# Patient Record
Sex: Male | Born: 1935 | Race: White | Hispanic: No | Marital: Married | Smoking: Former smoker
Health system: Southern US, Community
[De-identification: ages and names within clinical notes are randomized; demographics above are authoritative.]

## PROBLEM LIST (undated history)

## (undated) DIAGNOSIS — E785 Hyperlipidemia, unspecified: Secondary | ICD-10-CM

## (undated) DIAGNOSIS — K219 Gastro-esophageal reflux disease without esophagitis: Secondary | ICD-10-CM

## (undated) DIAGNOSIS — K5790 Diverticulosis of intestine, part unspecified, without perforation or abscess without bleeding: Secondary | ICD-10-CM

## (undated) DIAGNOSIS — I10 Essential (primary) hypertension: Secondary | ICD-10-CM

## (undated) DIAGNOSIS — H9190 Unspecified hearing loss, unspecified ear: Secondary | ICD-10-CM

## (undated) DIAGNOSIS — C61 Malignant neoplasm of prostate: Secondary | ICD-10-CM

## (undated) DIAGNOSIS — M1990 Unspecified osteoarthritis, unspecified site: Secondary | ICD-10-CM

## (undated) DIAGNOSIS — R918 Other nonspecific abnormal finding of lung field: Secondary | ICD-10-CM

## (undated) DIAGNOSIS — I4891 Unspecified atrial fibrillation: Secondary | ICD-10-CM

## (undated) DIAGNOSIS — I499 Cardiac arrhythmia, unspecified: Secondary | ICD-10-CM

## (undated) HISTORY — PX: EYE SURGERY: SHX253

## (undated) HISTORY — DX: Gastro-esophageal reflux disease without esophagitis: K21.9

## (undated) HISTORY — DX: Unspecified atrial fibrillation: I48.91

## (undated) HISTORY — DX: Diverticulosis of intestine, part unspecified, without perforation or abscess without bleeding: K57.90

## (undated) HISTORY — DX: Hyperlipidemia, unspecified: E78.5

## (undated) HISTORY — DX: Unspecified osteoarthritis, unspecified site: M19.90

## (undated) HISTORY — DX: Essential (primary) hypertension: I10

## (undated) HISTORY — DX: Other nonspecific abnormal finding of lung field: R91.8

## (undated) HISTORY — DX: Malignant neoplasm of prostate: C61

## (undated) HISTORY — PX: JOINT REPLACEMENT: SHX530

## (undated) HISTORY — PX: PROSTATE SURGERY: SHX751

## (undated) HISTORY — PX: VASECTOMY: SHX75

## (undated) HISTORY — PX: TONSILLECTOMY: SUR1361

---

## 1997-10-15 ENCOUNTER — Ambulatory Visit (HOSPITAL_COMMUNITY): Admission: RE | Admit: 1997-10-15 | Discharge: 1997-10-15 | Payer: Self-pay | Admitting: Family Medicine

## 1999-04-05 DIAGNOSIS — C61 Malignant neoplasm of prostate: Secondary | ICD-10-CM

## 1999-04-05 HISTORY — DX: Malignant neoplasm of prostate: C61

## 1999-04-05 HISTORY — PX: ESOPHAGEAL DILATION: SHX303

## 1999-06-22 ENCOUNTER — Other Ambulatory Visit: Admission: RE | Admit: 1999-06-22 | Discharge: 1999-06-22 | Payer: Self-pay | Admitting: Urology

## 1999-07-06 ENCOUNTER — Encounter: Admission: RE | Admit: 1999-07-06 | Discharge: 1999-10-04 | Payer: Self-pay | Admitting: Radiation Oncology

## 1999-08-17 ENCOUNTER — Ambulatory Visit (HOSPITAL_BASED_OUTPATIENT_CLINIC_OR_DEPARTMENT_OTHER): Admission: RE | Admit: 1999-08-17 | Discharge: 1999-08-17 | Payer: Self-pay | Admitting: Urology

## 1999-08-17 ENCOUNTER — Encounter: Payer: Self-pay | Admitting: Urology

## 1999-09-07 ENCOUNTER — Encounter (INDEPENDENT_AMBULATORY_CARE_PROVIDER_SITE_OTHER): Payer: Self-pay | Admitting: *Deleted

## 2000-03-17 ENCOUNTER — Ambulatory Visit (HOSPITAL_COMMUNITY): Admission: RE | Admit: 2000-03-17 | Discharge: 2000-03-17 | Payer: Self-pay | Admitting: Internal Medicine

## 2000-03-17 ENCOUNTER — Encounter: Payer: Self-pay | Admitting: Internal Medicine

## 2002-12-31 ENCOUNTER — Encounter (INDEPENDENT_AMBULATORY_CARE_PROVIDER_SITE_OTHER): Payer: Self-pay | Admitting: *Deleted

## 2002-12-31 ENCOUNTER — Ambulatory Visit (HOSPITAL_COMMUNITY): Admission: RE | Admit: 2002-12-31 | Discharge: 2002-12-31 | Payer: Self-pay | Admitting: Gastroenterology

## 2004-01-09 ENCOUNTER — Encounter: Payer: Self-pay | Admitting: Internal Medicine

## 2004-01-09 ENCOUNTER — Ambulatory Visit (HOSPITAL_COMMUNITY): Admission: RE | Admit: 2004-01-09 | Discharge: 2004-01-09 | Payer: Self-pay | Admitting: Internal Medicine

## 2004-12-16 ENCOUNTER — Ambulatory Visit: Payer: Self-pay | Admitting: Cardiology

## 2005-01-03 ENCOUNTER — Ambulatory Visit: Payer: Self-pay

## 2005-11-25 ENCOUNTER — Encounter: Admission: RE | Admit: 2005-11-25 | Discharge: 2005-11-25 | Payer: Self-pay | Admitting: Family Medicine

## 2006-04-04 HISTORY — PX: CATARACT EXTRACTION: SUR2

## 2008-05-10 ENCOUNTER — Inpatient Hospital Stay (HOSPITAL_COMMUNITY): Admission: EM | Admit: 2008-05-10 | Discharge: 2008-05-12 | Payer: Self-pay | Admitting: Emergency Medicine

## 2008-05-10 ENCOUNTER — Ambulatory Visit: Payer: Self-pay | Admitting: Cardiovascular Disease

## 2008-05-10 DIAGNOSIS — I4891 Unspecified atrial fibrillation: Secondary | ICD-10-CM | POA: Insufficient documentation

## 2008-05-11 ENCOUNTER — Encounter: Payer: Self-pay | Admitting: Cardiology

## 2008-05-14 ENCOUNTER — Ambulatory Visit: Payer: Self-pay

## 2008-05-14 ENCOUNTER — Encounter: Payer: Self-pay | Admitting: Internal Medicine

## 2008-05-29 ENCOUNTER — Encounter (INDEPENDENT_AMBULATORY_CARE_PROVIDER_SITE_OTHER): Payer: Self-pay | Admitting: *Deleted

## 2008-05-29 DIAGNOSIS — I10 Essential (primary) hypertension: Secondary | ICD-10-CM

## 2008-05-29 DIAGNOSIS — E785 Hyperlipidemia, unspecified: Secondary | ICD-10-CM

## 2008-05-29 DIAGNOSIS — K219 Gastro-esophageal reflux disease without esophagitis: Secondary | ICD-10-CM | POA: Insufficient documentation

## 2008-06-03 ENCOUNTER — Ambulatory Visit: Payer: Self-pay | Admitting: Cardiology

## 2008-06-06 ENCOUNTER — Ambulatory Visit: Payer: Self-pay | Admitting: Internal Medicine

## 2008-06-11 ENCOUNTER — Ambulatory Visit: Payer: Self-pay | Admitting: Cardiovascular Disease

## 2008-06-20 ENCOUNTER — Ambulatory Visit: Payer: Self-pay | Admitting: Cardiology

## 2008-06-27 ENCOUNTER — Ambulatory Visit: Payer: Self-pay | Admitting: Cardiology

## 2008-07-08 ENCOUNTER — Ambulatory Visit: Payer: Self-pay | Admitting: Cardiovascular Disease

## 2008-07-29 ENCOUNTER — Ambulatory Visit: Payer: Self-pay | Admitting: Cardiology

## 2008-08-05 ENCOUNTER — Ambulatory Visit: Payer: Self-pay | Admitting: Internal Medicine

## 2008-09-02 ENCOUNTER — Encounter: Payer: Self-pay | Admitting: *Deleted

## 2008-09-02 ENCOUNTER — Ambulatory Visit: Payer: Self-pay | Admitting: Cardiology

## 2008-09-02 LAB — CONVERTED CEMR LAB
POC INR: 2.1
Protime: 17.8

## 2008-09-30 ENCOUNTER — Ambulatory Visit: Payer: Self-pay | Admitting: Internal Medicine

## 2008-09-30 ENCOUNTER — Encounter (INDEPENDENT_AMBULATORY_CARE_PROVIDER_SITE_OTHER): Payer: Self-pay | Admitting: Pharmacist

## 2008-09-30 LAB — CONVERTED CEMR LAB: Prothrombin Time: 19.6 s

## 2008-10-03 ENCOUNTER — Encounter (INDEPENDENT_AMBULATORY_CARE_PROVIDER_SITE_OTHER): Payer: Self-pay | Admitting: *Deleted

## 2008-10-08 ENCOUNTER — Encounter: Payer: Self-pay | Admitting: *Deleted

## 2008-10-28 ENCOUNTER — Ambulatory Visit: Payer: Self-pay | Admitting: Internal Medicine

## 2008-10-28 ENCOUNTER — Encounter (INDEPENDENT_AMBULATORY_CARE_PROVIDER_SITE_OTHER): Payer: Self-pay | Admitting: Cardiology

## 2008-10-28 LAB — CONVERTED CEMR LAB: Prothrombin Time: 19.6 s

## 2008-11-25 ENCOUNTER — Ambulatory Visit: Payer: Self-pay | Admitting: Internal Medicine

## 2008-12-30 ENCOUNTER — Ambulatory Visit: Payer: Self-pay | Admitting: Cardiology

## 2009-01-27 ENCOUNTER — Ambulatory Visit: Payer: Self-pay | Admitting: Cardiovascular Disease

## 2009-01-27 LAB — CONVERTED CEMR LAB: POC INR: 2.7

## 2009-02-24 ENCOUNTER — Ambulatory Visit: Payer: Self-pay | Admitting: Cardiovascular Disease

## 2009-03-24 ENCOUNTER — Ambulatory Visit: Payer: Self-pay | Admitting: Internal Medicine

## 2009-03-24 LAB — CONVERTED CEMR LAB: POC INR: 2.7

## 2009-04-21 ENCOUNTER — Ambulatory Visit: Payer: Self-pay | Admitting: Cardiology

## 2009-04-21 LAB — CONVERTED CEMR LAB: POC INR: 2.4

## 2009-05-19 ENCOUNTER — Ambulatory Visit: Payer: Self-pay | Admitting: Internal Medicine

## 2009-05-19 LAB — CONVERTED CEMR LAB: POC INR: 2.3

## 2009-05-28 ENCOUNTER — Encounter (INDEPENDENT_AMBULATORY_CARE_PROVIDER_SITE_OTHER): Payer: Self-pay | Admitting: *Deleted

## 2009-06-16 ENCOUNTER — Ambulatory Visit: Payer: Self-pay | Admitting: Cardiology

## 2009-07-03 ENCOUNTER — Ambulatory Visit: Payer: Self-pay | Admitting: Cardiology

## 2009-07-14 ENCOUNTER — Ambulatory Visit: Payer: Self-pay | Admitting: Cardiovascular Disease

## 2009-07-14 LAB — CONVERTED CEMR LAB: POC INR: 2.3

## 2009-07-27 ENCOUNTER — Telehealth: Payer: Self-pay | Admitting: Cardiology

## 2009-08-11 ENCOUNTER — Ambulatory Visit: Payer: Self-pay | Admitting: Cardiovascular Disease

## 2009-08-11 LAB — CONVERTED CEMR LAB: POC INR: 3.2

## 2009-09-01 ENCOUNTER — Ambulatory Visit: Payer: Self-pay | Admitting: Internal Medicine

## 2009-09-29 ENCOUNTER — Ambulatory Visit: Payer: Self-pay | Admitting: Internal Medicine

## 2009-10-06 ENCOUNTER — Ambulatory Visit: Payer: Self-pay | Admitting: Internal Medicine

## 2009-10-19 ENCOUNTER — Telehealth: Payer: Self-pay | Admitting: Internal Medicine

## 2009-10-20 ENCOUNTER — Ambulatory Visit: Payer: Self-pay | Admitting: Internal Medicine

## 2009-10-20 ENCOUNTER — Encounter (INDEPENDENT_AMBULATORY_CARE_PROVIDER_SITE_OTHER): Payer: Self-pay | Admitting: *Deleted

## 2009-10-20 LAB — CONVERTED CEMR LAB: POC INR: 2.9

## 2009-10-23 ENCOUNTER — Ambulatory Visit: Payer: Self-pay | Admitting: Internal Medicine

## 2009-10-27 ENCOUNTER — Ambulatory Visit: Payer: Self-pay | Admitting: Cardiology

## 2009-10-27 LAB — CONVERTED CEMR LAB: POC INR: 1.4

## 2009-11-03 ENCOUNTER — Ambulatory Visit: Payer: Self-pay | Admitting: Cardiology

## 2009-12-01 ENCOUNTER — Ambulatory Visit: Payer: Self-pay | Admitting: Cardiovascular Disease

## 2009-12-01 LAB — CONVERTED CEMR LAB: POC INR: 2.8

## 2009-12-29 ENCOUNTER — Ambulatory Visit: Payer: Self-pay | Admitting: Cardiovascular Disease

## 2009-12-29 LAB — CONVERTED CEMR LAB: POC INR: 2.5

## 2010-01-26 ENCOUNTER — Ambulatory Visit: Payer: Self-pay | Admitting: Cardiology

## 2010-01-26 LAB — CONVERTED CEMR LAB: POC INR: 3.4

## 2010-02-16 ENCOUNTER — Ambulatory Visit: Payer: Self-pay | Admitting: Cardiovascular Disease

## 2010-03-09 ENCOUNTER — Ambulatory Visit: Payer: Self-pay | Admitting: Cardiovascular Disease

## 2010-04-04 HISTORY — PX: REPLACEMENT TOTAL KNEE: SUR1224

## 2010-04-06 ENCOUNTER — Ambulatory Visit: Admission: RE | Admit: 2010-04-06 | Discharge: 2010-04-06 | Payer: Self-pay | Source: Home / Self Care

## 2010-04-06 LAB — CONVERTED CEMR LAB: POC INR: 2.5

## 2010-05-04 ENCOUNTER — Ambulatory Visit: Admission: RE | Admit: 2010-05-04 | Discharge: 2010-05-04 | Payer: Self-pay | Source: Home / Self Care

## 2010-05-04 NOTE — Progress Notes (Signed)
Summary: [place] regarding medication issue  Phone Note Call from Patient Call back at [facility] Phone [phone]   Caller: Patient Summary of Call: Pt calling regarding a medication Initial call taken by: Judie Grieve,  July 27, 2009 2:07 PM  Follow-up for Phone Call        Christine please call. Follow-up by: Gaylord Shih, MD, [facility],  July 27, 2009 2:52 PM     Appended Document: [place] regarding medication issue FYI PT WAS STARTED ON [place]  BY PMD  FOR ARTHRITIS  TO THE KNEES. WANTING TO MAKE SURE NO CONTRAINDICATIONS./CY

## 2010-05-04 NOTE — Medication Information (Signed)
Summary: rov/ewj  Anticoagulant Therapy  Managed by: Cloyde Reams, RN, BSN PCP: Margrett Rud  Supervising MD: Myrtis Ser MD, Tinnie Gens Indication 1: Atrial Fibrillation (ICD-427.31) Lab Used: [facility] Site: [facility] INR POC 2.7 INR RANGE 2 - 3  Dietary changes: yes       Details: Out of town last week, diet varied.    Health status changes: no    Bleeding/hemorrhagic complications: no    Recent/future hospitalizations: no    Any changes in medication regimen? no    Recent/future dental: no  Any missed doses?: no       Is patient compliant with meds? yes       Allergies (verified): No Known Drug Allergies  Anticoagulation Management History:      The patient is taking warfarin and comes in today for a routine follow up visit.  Positive risk factors for bleeding include an age of 75 years or older.  The bleeding index is 'intermediate risk'.  Positive CHADS2 values include History of HTN and Age > 72 years old.  The start date was 06/03/2008.  Anticoagulation responsible provider: Myrtis Ser MD, Tinnie Gens.  INR POC: 2.7.  Cuvette Lot#: [id number].  Exp: 08/2010.    Anticoagulation Management Assessment/Plan:      The patient's current anticoagulation dose is Coumadin 5 mg tabs: Use as directed by anticoagulation clinic..  The target INR is 2 - 3.  The next INR is due 07/14/2009.  Anticoagulation instructions were given to patient.  Results were reviewed/authorized by Cloyde Reams, RN, BSN.  He was notified by Cloyde Reams RN.         Prior Anticoagulation Instructions: INR 2.3  Continue on same dosage 1 tablet daily except 1/2 tablet on Sundays and Wednesdays.  Recheck in 4 weeks.    Current Anticoagulation Instructions: INR 2.7  Continue on same dosage 1 tablet daily except 1/2 tablet on Sundays and Wednesdays.  Recheck in 4 weeks.

## 2010-05-04 NOTE — Assessment & Plan Note (Signed)
Summary: discuss EGD on BT--ch.   History of Present Illness Visit Type: Initial Visit Primary GI MD: Lina Sar MD Primary Provider: Shary Decamp, MD Chief Complaint: Tightness in esophagus that has recently subsided History of Present Illness:   This is an 77 year old white male who is on Coumadin for atrial fibrillation. He had a recurrence of solid food dysphagia approximately one month ago but his symptoms have gone away and he is currently not having any problems with swallowing. There is a history of a benign distal esophageal stricture dating back to 1988 when he was dilated with Elease Hashimoto dilators 42 [place] up to 29 [place]. He had a 3 cm hiatal hernia and a subsequent dilatation in 1993 then again in 2001 and 2005. He has been on Boniva for osteopenia. He has never had a colonoscopy.   GI Review of Systems    Reports acid reflux and  bloating.      Denies abdominal pain, belching, chest pain, dysphagia with liquids, dysphagia with solids, heartburn, loss of appetite, nausea, vomiting, vomiting blood, weight loss, and  weight gain.      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     Denies black tarry stools, change in bowel habit, constipation, diarrhea, fecal incontinence, heme positive stool, hemorrhoids, irritable bowel syndrome, jaundice, light color stool, liver problems, rectal bleeding, and  rectal pain. Preventive Screening-Counseling & Management  Alcohol-Tobacco     Smoking Status: quit      Drug Use:  no.      Current Medications (verified): 1)  Ramipril 2.5 Mg Caps (Ramipril) .... Take One Capsule By Mouth Daily 2)  Terazosin Hcl 2 Mg Caps (Terazosin Hcl) .... Take 1 Tablet By Mouth At Bedtime. 3)  Famotidine 20 Mg Tabs (Famotidine) .... 2 Tab Once Daily 4)  Glucosamine 1500 Complex  Caps (Glucosamine-Chondroit-Vit C-Mn) .Marland Kitchen.. 1 Tab Once Daily 5)  Fish Oil 1000 Mg Caps (Omega-3 Fatty Acids) .Marland Kitchen.. 1 Cap Once Daily 6)  Vitamin D 1000 Unit Tabs (Cholecalciferol) .Marland Kitchen.. 1  Tab Every Other Day 7)  Boniva 150 Mg Tabs (Ibandronate Sodium) .... Monthly 8)  Vitamin C 1000 Mg Tabs (Ascorbic Acid) .Marland Kitchen.. 1 Tab Once Daily 9)  Coumadin 5 Mg Tabs (Warfarin Sodium) .... Use As Directed By Anticoagulation Clinic.  Allergies (verified): No Known Drug Allergies  Past History:  Past Medical History: Reviewed history from 05/29/2008 and no changes required. ATRIAL FIBRILLATION (ICD-427.31) HYPERTENSION, BENIGN (ICD-401.1) HYPERLIPIDEMIA (ICD-272.4) Hx of GERD (ICD-530.81) Left Lower Lobe Infiltrate, questionable pneumonia 05/2008 Prostate CA treated with radiation in 2001.  Skin CA S/P multiple outpatient surgical removals.  History of dysphagia S/P esophageal dilatation in 2001. 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  Alcohol Use - yes Daily Caffeine Use Illicit Drug Use - no Smoking Status:  quit Drug Use:  no  Review of Systems       The patient complains of arthritis/joint pain, back pain, and urination - excessive.  The patient denies allergy/sinus, anemia, anxiety-new, blood in urine, breast changes/lumps, change in vision, confusion, cough, coughing up blood, depression-new, fainting, fatigue, fever, headaches-new, hearing problems, heart murmur, heart rhythm changes, itching, menstrual pain, muscle pains/cramps, night sweats, nosebleeds, pregnancy symptoms, shortness of breath, skin rash, sleeping problems, sore throat, swelling of feet/legs, swollen lymph glands, thirst - excessive , urination - excessive , urination changes/pain,  urine leakage, vision changes, and voice change.         .ros  Vital Signs:  Patient profile:   77 year old male Height:      70 inches Weight:      159.50 pounds BMI:     22.97 Pulse rate:   64 /  minute Pulse rhythm:   regular BP sitting:   112 / 60  (left arm) Cuff size:   regular  Vitals Entered By: June McMurray CMA Duncan Dull) (October 06, 2009 10:35 AM)  Physical Exam  General:  thin, tall man, alert and oriented, no cough and no hoarseness. Eyes:  PERRLA, no icterus. Mouth:  No deformity or lesions, dentition normal. Neck:  Supple; no masses or thyromegaly. Lungs:  Clear throughout to auscultation. Heart:  irregular S1-S2, no murmur. Skin:  Intact without significant lesions or rashes. Psych:  Alert and cooperative. Normal mood and affect.   Impression & Recommendations:  Problem # 1:  COUMADIN THERAPY (ICD-V58.61) Patient is on chronic long-term Coumadin for atrial fibrillation. Coumadin would have to be discontinued prior to endoscopy and dilatation.  Problem # 2:  Hx of GERD (ICD-530.81) Patient has a history of a benign distal esophageal stricture dilated on 4 prior occasions. His last one was in 2005. He is currently asymptomatic but had some problems last month. He would like to wait and call us when the dysphagia recurs. He will continue on Pepcid 20 mg twice a day.  Patient Instructions: 1)  Please schedule a follow-up appointment as needed.  2)  Please call our GI Office back should you have recurrent symptoms such as trouble swallowing. 3)  Continue Pepcid 20 mg two times a day. 4)  Recommend screening colonoscopy. This could be done at the time of his next dilatation. 5)  Copy sent to : Dr Shary Decamp 6)  The medication list was reviewed and reconciled.  All changed / newly prescribed medications were explained.  A complete medication list was provided to the patient / caregiver.

## 2010-05-04 NOTE — Assessment & Plan Note (Signed)
Summary: 1 year f/u /cy   Visit Type:  Follow-up Primary Provider:  Margrett Rud   CC:  no complaints.  History of Present Illness: Gregory Werner comes in today for evaluation management of his chronic atrial ventilation. He continues on anticoagulation.  He is totally asymptomatic. He's had no bleeding or melena.  His blood pressures and excellent control.    Current Medications (verified): 1)  Ramipril 2.5 Mg Caps (Ramipril) .... Take One Capsule By Mouth Daily 2)  Terazosin Hcl 2 Mg Caps (Terazosin Hcl) .... Take 1 Tablet By Mouth At Bedtime. 3)  Famotidine 20 Mg Tabs (Famotidine) .... 2 Tab Once Daily 4)  Glucosamine 1500 Complex  Caps (Glucosamine-Chondroit-Vit C-Mn) .Marland Kitchen.. 1 Tab Once Daily 5)  Fish Oil 1000 Mg Caps (Omega-3 Fatty Acids) .Marland Kitchen.. 1 Cap Once Daily 6)  Vitamin D 1000 Unit Tabs (Cholecalciferol) .Marland Kitchen.. 1 Tab Every Other Day 7)  Boniva 150 Mg Tabs (Ibandronate Sodium) .... Monthly 8)  Vitamin C 1000 Mg Tabs (Ascorbic Acid) .Marland Kitchen.. 1 Tab Once Daily 9)  Coumadin 5 Mg Tabs (Warfarin Sodium) .... Use As Directed By Anticoagulation Clinic.  Allergies (verified): No Known Drug Allergies  Past History:  Past Medical History: Last updated: 05/29/2008 ATRIAL FIBRILLATION (ICD-427.31) HYPERTENSION, BENIGN (ICD-401.1) HYPERLIPIDEMIA (ICD-272.4) Hx of GERD (ICD-530.81) Left Lower Lobe Infiltrate, questionable pneumonia 05/2008 Prostate CA treated with radiation in 2001.  Skin CA S/P multiple outpatient surgical removals.  History of dysphagia S/P esophageal dilatation in 2001. Hx of Diverticulosis Hx of Urinary Frequency  Past Surgical History: Last updated: 05/29/2008 Esophageal dilatation 2001  Family History: Last updated: 05/29/2008 Family History of Coronary Artery Disease: Father & Mother had Infarcts.  Social History: Last updated: 05/29/2008 Marital Status: Married Retired: Geneticist, molecular at [employer]  Review of Systems       negative  other than history of present illness  Vital Signs:  Patient profile:   77 year old male Height:      69 inches Weight:      164 pounds BMI:     24.31 Pulse rate:   58 / minute BP sitting:   126 / 72  (left arm) Cuff size:   regular  Vitals Entered By: Hardin Negus, RMA (July 03, 2009 2:48 PM)   Physical Exam  General:  elderly, frail Head:  normocephalic and atraumatic Eyes:  PERRLA/EOM intact; conjunctiva and lids normal. Neck:  Neck supple, no JVD. No masses, thyromegaly or abnormal cervical nodes. Chest Gregory Werner:  no deformities or breast masses noted Lungs:  Clear bilaterally to auscultation and percussion. Heart:  PMI nondisplaced, irregular rate and rhythm, no murmur rub or gallop Abdomen:  Bowel sounds positive; abdomen soft and non-tender without masses, organomegaly, or hernias noted. No hepatosplenomegaly. Msk:  decreased ROM.   Pulses:  pulses normal in all 4 extremities Extremities:  No clubbing or cyanosis. Neurologic:  Alert and oriented x 3. Skin:  Intact without lesions or rashes. Psych:  Normal affect.   EKG  Procedure date:  07/03/2009  Findings:      Chronic atrial fibrillation, no change  Impression & Recommendations:  Problem # 1:  ATRIAL FIBRILLATION (ICD-427.31) Assessment Unchanged His condition is stable. No change in management. I tried to convince him again that he needs to stay on warfarin. Hopefully he'll stay on it. His updated medication list for this problem includes:    Coumadin 5 Mg Tabs (Warfarin sodium) ..... Use as directed by anticoagulation clinic.  Orders: EKG w/ Interpretation (  [id number])  Problem # 2:  HYPERTENSION, BENIGN (ICD-401.1) Assessment: Improved  His updated medication list for this problem includes:    Ramipril 2.5 Mg Caps (Ramipril) .Marland Kitchen... Take one capsule by mouth daily    Terazosin Hcl 2 Mg Caps (Terazosin hcl) .Marland Kitchen... Take 1 tablet by mouth at bedtime.  Problem # 3:  Hx of GERD (ICD-530.81) Assessment:  Unchanged  His updated medication list for this problem includes:    Famotidine 20 Mg Tabs (Famotidine) .Marland Kitchen... 2 tab once daily  Problem # 4:  COUMADIN THERAPY (ICD-V58.61) Assessment: Unchanged  Patient Instructions: 1)  Your physician recommends that you schedule a follow-up appointment in: YEAR WITH DR Mazin Emma 2)  Your physician recommends that you continue on your current medications as directed. Please refer to the Current Medication list given to you today.

## 2010-05-04 NOTE — Medication Information (Signed)
Summary: rov/ewj  Anticoagulant Therapy  Managed by: Cloyde Reams, RN, BSN PCP: Margrett Rud  Supervising MD: Myrtis Ser MD, Tinnie Gens Indication 1: Atrial Fibrillation (ICD-427.31) Lab Used: [facility] Site: [facility] INR POC 2.4 INR RANGE 2 - 3  Dietary changes: no    Health status changes: no    Bleeding/hemorrhagic complications: no    Recent/future hospitalizations: no    Any changes in medication regimen? no    Recent/future dental: no  Any missed doses?: no       Is patient compliant with meds? yes       Allergies (verified): No Known Drug Allergies  Anticoagulation Management History:      The patient is taking warfarin and comes in today for a routine follow up visit.  Positive risk factors for bleeding include an age of 50 years or older.  The bleeding index is 'intermediate risk'.  Positive CHADS2 values include History of HTN and Age > 14 years old.  The start date was 06/03/2008.  Anticoagulation responsible provider: Myrtis Ser MD, Tinnie Gens.  INR POC: 2.4.  Cuvette Lot#: [id number].  Exp: 07/2010.    Anticoagulation Management Assessment/Plan:      The patient's current anticoagulation dose is Coumadin 5 mg tabs: Use as directed by anticoagulation clinic..  The target INR is 2 - 3.  The next INR is due 05/19/2009.  Anticoagulation instructions were given to patient.  Results were reviewed/authorized by Cloyde Reams, RN, BSN.  He was notified by Cloyde Reams RN.         Prior Anticoagulation Instructions: INR 2.7  Continue on same dosage 1 tablet daily except 1/2 tablet on Sundays and Wednesdays.   Recheck in 4 weeks.    Current Anticoagulation Instructions: INR 2.4  Continue on same dosage 1 tablet daily except 1/2 tablet on Sundays and Wednesdays.  Recheck in 4 weeks.

## 2010-05-04 NOTE — Miscellaneous (Signed)
Summary:   DYSPHAGIA/STRICTURE   DR.B.../previsit done/rm  Clinical Lists Changes  Observations: Added new observation of ALLERGY REV: Done (10/20/2009 13:42)

## 2010-05-04 NOTE — Medication Information (Signed)
Summary: rov/ewj  Anticoagulant Therapy  Managed by: Weston Brass, PharmD Referring MD: Daleen Squibb MD, Maisie Fus PCP: Shary Decamp, MD Supervising MD: Shirlee Latch MD, Marah Park Indication 1: Atrial Fibrillation (ICD-427.31) Lab Used: [facility] Site: [facility] INR POC 3.4 INR RANGE 2 - 3  Dietary changes: no    Health status changes: no    Bleeding/hemorrhagic complications: no    Recent/future hospitalizations: no    Any changes in medication regimen? yes       Details: had a couple of cortisone shots last week  Recent/future dental: no  Any missed doses?: no       Is patient compliant with meds? yes       Allergies: No Known Drug Allergies  Anticoagulation Management History:      The patient is taking warfarin and comes in today for a routine follow up visit.  Positive risk factors for bleeding include an age of 75 years or older.  The bleeding index is 'intermediate risk'.  Positive CHADS2 values include History of HTN and Age > 75 years old.  The start date was 06/03/2008.  Anticoagulation responsible provider: Shirlee Latch MD, Onie Hayashi.  INR POC: 3.4.  Cuvette Lot#: [id number].  Exp: 03/2011.    Anticoagulation Management Assessment/Plan:      The patient's current anticoagulation dose is Warfarin sodium 5 mg tabs: Use as directed by Anticoagulation Clinic.  The target INR is 2 - 3.  The next INR is due 02/16/2010.  Anticoagulation instructions were given to patient.  Results were reviewed/authorized by Weston Brass, PharmD.  He was notified by Weston Brass PharmD.         Prior Anticoagulation Instructions: INR 2.5  Continue on same dosage 1 tablet daily except 1/2 tablet on Sundays and Wednesdays.  Recheck in 4 weeks.    Current Anticoagulation Instructions: INR 3.4  Skip tomorrow's dose of Coumadin then resume same dose of 1 tablet every day except 1/2 tablet on Sunday and Wednesday.  Eat an extra serving of green vegetables.  Recheck INR in 3 weeks.

## 2010-05-04 NOTE — Medication Information (Signed)
Summary: rov/tm  Anticoagulant Therapy  Managed by: Bethena Midget, RN, BSN Referring MD: Daleen Squibb MD, Maisie Fus PCP: Margrett Rud  Supervising MD: Ladona Ridgel MD, Sharlot Gowda Indication 1: Atrial Fibrillation (ICD-427.31) Lab Used: [facility] Site: [facility] INR POC 2.6 INR RANGE 2 - 3  Dietary changes: no    Health status changes: no    Bleeding/hemorrhagic complications: no    Recent/future hospitalizations: no    Any changes in medication regimen? no    Recent/future dental: no  Any missed doses?: no       Is patient compliant with meds? yes       Allergies: No Known Drug Allergies  Anticoagulation Management History:      The patient is taking warfarin and comes in today for a routine follow up visit.  Positive risk factors for bleeding include an age of 23 years or older.  The bleeding index is 'intermediate risk'.  Positive CHADS2 values include History of HTN and Age > 77 years old.  The start date was 06/03/2008.  Anticoagulation responsible provider: Ladona Ridgel MD, Sharlot Gowda.  INR POC: 2.6.  Cuvette Lot#: [id number].  Exp: 11/2010.    Anticoagulation Management Assessment/Plan:      The patient's current anticoagulation dose is Coumadin 5 mg tabs: Use as directed by anticoagulation clinic..  The target INR is 2 - 3.  The next INR is due 09/29/2009.  Anticoagulation instructions were given to patient.  Results were reviewed/authorized by Bethena Midget, RN, BSN.  He was notified by Alcus Dad B Pharm.         Prior Anticoagulation Instructions: INR 3.2 Skip Wednesday's dose then resume 5mg s daily except 2.5mg s on Sundays and Wednesdays. Recheck in 3 weeks.   Current Anticoagulation Instructions: INR-2.6 Resume normal dosing scedule. Take 1/2 a tablet on Sunday and Wednesday and take 1 tablet on all other days. Return in  4 weeks

## 2010-05-04 NOTE — Medication Information (Signed)
Summary: rov/ln  Anticoagulant Therapy  Managed by: Bethena Midget, RN, BSN Referring MD: Daleen Squibb MD, Maisie Fus PCP: Shary Decamp, MD Supervising MD: Juanda Chance MD, Jasper Ruminski Indication 1: Atrial Fibrillation (ICD-427.31) Lab Used: [facility] Site: [facility] INR POC 1.4 INR RANGE 2 - 3  Dietary changes: no    Health status changes: no    Bleeding/hemorrhagic complications: no    Recent/future hospitalizations: no    Any changes in medication regimen? no    Recent/future dental: no  Any missed doses?: no       Is patient compliant with meds? yes      Comments: Off for Endo from Tues to Friday, date of procedure.  Restarted back on coumadin on Sat.   Allergies: No Known Drug Allergies  Anticoagulation Management History:      His anticoagulation is being managed by telephone today.  Positive risk factors for bleeding include an age of 75 years or older.  The bleeding index is 'intermediate risk'.  Positive CHADS2 values include History of HTN and Age > 75 years old.  The start date was 06/03/2008.  Anticoagulation responsible provider: Juanda Chance MD, Smitty Cords.  INR POC: 1.4.  Cuvette Lot#: [id number].  Exp: 01/2011.    Anticoagulation Management Assessment/Plan:      The patient's current anticoagulation dose is Coumadin 5 mg tabs: Use as directed by anticoagulation clinic..  The target INR is 2 - 3.  The next INR is due 11/03/2009.  Anticoagulation instructions were given to patient.  Results were reviewed/authorized by Bethena Midget, RN, BSN.  He was notified by Bethena Midget, RN, BSN.         Prior Anticoagulation Instructions: INR 2.9  Hold coumadin today through Thursday.  After procedure on Friday, take 1 tab daily.  Re-check INR on 7/26.  Current Anticoagulation Instructions: INR 1.4 Continue 5mg s everyday except 2.5mg s on Sundays and Wednesdays. Recheck in 7-10days.

## 2010-05-04 NOTE — Medication Information (Signed)
Summary: rov/ewj  Anticoagulant Therapy  Managed by: Cloyde Reams, RN, BSN PCP: Margrett Rud  Supervising MD: Excell Seltzer MD, Casimiro Needle Indication 1: Atrial Fibrillation (ICD-427.31) Lab Used: [facility] Site: [facility] INR POC 2.3 INR RANGE 2 - 3  Dietary changes: no    Health status changes: no    Bleeding/hemorrhagic complications: no    Recent/future hospitalizations: no    Any changes in medication regimen? no    Recent/future dental: no  Any missed doses?: no       Is patient compliant with meds? yes       Allergies (verified): No Known Drug Allergies  Anticoagulation Management History:      The patient is taking warfarin and comes in today for a routine follow up visit.  Positive risk factors for bleeding include an age of 75 years or older.  The bleeding index is 'intermediate risk'.  Positive CHADS2 values include History of HTN and Age > 56 years old.  The start date was 06/03/2008.  Anticoagulation responsible provider: Excell Seltzer MD, Casimiro Needle.  INR POC: 2.3.  Cuvette Lot#: [id number].  Exp: 08/2010.    Anticoagulation Management Assessment/Plan:      The patient's current anticoagulation dose is Coumadin 5 mg tabs: Use as directed by anticoagulation clinic..  The target INR is 2 - 3.  The next INR is due 08/11/2009.  Anticoagulation instructions were given to patient.  Results were reviewed/authorized by Cloyde Reams, RN, BSN.  He was notified by Cloyde Reams RN.         Prior Anticoagulation Instructions: INR 2.7  Continue on same dosage 1 tablet daily except 1/2 tablet on Sundays and Wednesdays.  Recheck in 4 weeks.    Current Anticoagulation Instructions: INR 2.3  Continue on same dosage 1 tablet daily except 1/2 tablet on Sundays and Wednesdays.  Recheck in 4 weeks.

## 2010-05-04 NOTE — Op Note (Signed)
Summary: EGD                    [place]  Patient:    Gregory Werner, Gregory Werner                        MRN: [id number] Adm. Date:  [id number] Attending:  Mervin Hack                         History and Physical  PROCEDURE:  Upper endoscopy and esophageal dilation.  INDICATIONS:  This 77 year old gentleman has a benign distal esophageal stricture.  He was dilated in the past and has now recurrence of solid food dysphagia.  He was seen in the office two months ago at which time, he decided to wait a little longer before his dysphagia needs to be treated, but he had another episode of choking several weeks ago, and he decided to go ahead with the dilatation.  He has been on acid-suppressing agent.  ENDOSCOPE:  Fujinon single channel videoscope.  SEDATION:  Versed 10 mg IV, Demerol 50 mg IV.  FINDINGS:  Fujinon single channel videoscope passed under direct vision into the posterior pharynx and into the esophagus.  The patient was monitored by pulse oximetry.  His oxygen saturations were normal.  He was cooperative. Proximal and mid esophageal mucosa was normal.  There was an eccentric lumen in the distal esophagus with a marked esophageal stricture.  It allowed the endoscope to pass through, but it was rather eccentric which probably created the obstructive symptoms.  The stricture was benign appearing.  There were no friability and no active erosions.  There was also a small hiatal hernia which was easily reducable.  Stomach.  The stomach was insufflated with air and showed normal gastric mucosa, antrum, and pyloris.  Retroflexion of endoscope showed a normal fundus and cardia.  Duodenum.  Duodenal bulb and descending duodenum were normal.  Guide wire was placed into the stomach and endoscope was retracted and Savary dilators passed over the guide wire without fluoroscopic guidance using 14, 15, 16, and 17 mm dilator.  There was a small amount of blood on the  large dilator.  The patient tolerated the procedure well.  IMPRESSION:  Benign distal esophageal stricture, status post dilatation to [address] [place].  PLAN: 1. Continue antireflux measures. 2. Acid suppressing agents. 3. Avoid aspirin and NSAIDS. 4. The patient will return on p.r.n. basis.DD:  03/17/00 TD:  03/17/00 Job: [id number]

## 2010-05-04 NOTE — Op Note (Signed)
Summary: COLON (Dr Randa Evens)    NAME:  Gregory Werner, STAGE                           ACCOUNT NO.:  [id number]   MEDICAL RECORD NO.:  [id number]                   PATIENT TYPE:  AMB   LOCATION:  ENDO                                 FACILITY:  [facility]   PHYSICIAN:  James L. Malon Kindle., M.D.          DATE OF BIRTH:  Apr 13, 1924   DATE OF PROCEDURE:  12/31/2002  DATE OF DISCHARGE:                                 OPERATIVE REPORT   PROCEDURE:  Colonoscopy.   MEDICATIONS:  1. Fentanyl 87.5 mcg.  2. Versed 9 mg IV.   INDICATION:  Rectal bleeding.   DESCRIPTION OF PROCEDURE:  The procedure had been explained to the patient  and consent obtained.  The patient in the left lateral decubitus position,  the Olympus scope was inserted and advanced.  We advanced to the sigmoid.  It had extensive diverticular disease.  Multiple maneuvers were required and  eventually we were able to pass the sigmoid and were able to advance easily  to the cecum.  The ileocecal valve and appendiceal orifice seen.  The scope  was withdrawn, and the cecum, ascending colon, transverse colon, descending  and sigmoid colon seen well, no polyps, again extensive diverticular disease  in the sigmoid.  The scope was withdrawn down to the rectum.  The rectum was  free of polyps but at the very distal part of the rectum over the prostate  was a teleangiectasia and erythema consistent with radiation proctitis.  It  was quite friable.  There small internal hemorrhoids.  The scope was  withdrawn.  The patient tolerated the procedure well, was maintained on low-  flow oxygen and monitored by pulse oximetry throughout with no obvious  problems.  He was bradycardic with pulses in the 40's-50's throughout the  procedure, felt to be due to his beta blockers.   ASSESSMENT:  1. 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  2. Marked diverticular disease.   PLAN:  We will give diverticulitis information and start on fiber  supplements daily to soften the stool and will give Rowasa suppositories  q.h.s.  We will see him back in the office in 6-8 weeks.  If he is not  improved, we may need to fulgurate this area with the argon plasma  coagulator.                                               James L. Malon Kindle., M.D.    Waldron Session  D:  12/31/2002  T:  12/31/2002  Job:  [id number]   cc:   [place]. Andrey Campanile, M.D.  [address]  [place]  [place] [address]  Fax: [phone]  This report was created from the original endoscopy report, which was reviewed and signed by the above listed endoscopist.

## 2010-05-04 NOTE — Medication Information (Signed)
Summary: rov/ewj  Anticoagulant Therapy  Managed by: Cloyde Reams, RN, BSN Referring MD: Daleen Squibb MD, Maisie Fus PCP: Shary Decamp, MD Supervising MD: Eden Emms MD, Theron Arista Indication 1: Atrial Fibrillation (ICD-427.31) Lab Used: [facility] Site: [facility] INR POC 2.5 INR RANGE 2 - 3  Dietary changes: yes       Details: Incr vit K intake recently.   Health status changes: no    Bleeding/hemorrhagic complications: no    Recent/future hospitalizations: no    Any changes in medication regimen? no    Recent/future dental: no  Any missed doses?: no       Is patient compliant with meds? yes       Allergies: No Known Drug Allergies  Anticoagulation Management History:      The patient is taking warfarin and comes in today for a routine follow up visit.  Positive risk factors for bleeding include an age of 51 years or older.  The bleeding index is 'intermediate risk'.  Positive CHADS2 values include History of HTN and Age > 70 years old.  The start date was 06/03/2008.  Anticoagulation responsible provider: Eden Emms MD, Theron Arista.  INR POC: 2.5.  Cuvette Lot#: [id number].  Exp: 02/2011.    Anticoagulation Management Assessment/Plan:      The patient's current anticoagulation dose is Warfarin sodium 5 mg tabs: Use as directed by Anticoagulation Clinic.  The target INR is 2 - 3.  The next INR is due 01/26/2010.  Anticoagulation instructions were given to patient.  Results were reviewed/authorized by Cloyde Reams, RN, BSN.  He was notified by Cloyde Reams RN.         Prior Anticoagulation Instructions: INR 2.8  Continue on same dosage 1 tablet daily except 1/2 tablet on Sundays and Wednesdays.  Recheck in 4 weeks.    Current Anticoagulation Instructions: INR 2.5  Continue on same dosage 1 tablet daily except 1/2 tablet on Sundays and Wednesdays.  Recheck in 4 weeks.

## 2010-05-04 NOTE — Progress Notes (Signed)
Summary: TRIAGE  Phone Note Call from Patient Call back at Home Phone [phone]   Caller: Patient Call For: Dr. Juanda Chance Reason for Call: Talk to Nurse Summary of Call: pt called to sch EGD, but is on Coumadin... complaining of "general tightness in esophagus region" Initial call taken by: Vallarie Mare,  October 19, 2009 2:37 PM  Follow-up for Phone Call        Last seen 10-06-09. Worsening dysphagia, pt. is on Coumadin.  DR.Davarion Cuffee PLEASE ADVISE  Follow-up by: Laureen Ochs LPN,  October 19, 2009 3:03 PM  Additional Follow-up for Phone Call Additional follow up Details #1::        chart reviewed. Es stricture 2005 and 2000, s/ p dilation. Now on Coumadin since 06/2018 for at. fib ( Dr Daleen Squibb). He  will benefit from EGD/dil. Please hold Coumadin and set up for EGD/dil in [facility] on Fri 7/22/20011. I have spoken to the pt over the phone and he agrees. Additional Follow-up by: Hart Carwin MD,  October 19, 2009 6:19 PM    Additional Follow-up for Phone Call Additional follow up Details #2::    Pt. is scheduled for a previsit today at 2pm and his Endo/Dil. in [facility] on 10-23-09 at 3:30pm. Pt. instructed to call back as needed.  Follow-up by: Laureen Ochs LPN,  October 20, 2009 8:14 AM

## 2010-05-04 NOTE — Procedures (Signed)
Summary: EGD   EGD  Procedure date:  01/09/2004  Findings:      Location: [facility]   Patient Name: [name], [name] MRN: [id number] Procedure Procedures: Panendoscopy (EGD) CPT: 43235.    with esophageal dilation. CPT: G9296129.  Personnel: Endoscopist: Genevive Printup L. Juanda Chance, MD.  Referred By: Margrett Rud, MD.  Exam Location: Exam performed in Endoscopy Suite.  Patient Consent: Procedure, Alternatives, Risks and Benefits discussed, consent obtained,  Indications Symptoms: Dysphagia.  Surveillance of: 2001.  History  Current Medications: Patient is not currently taking Coumadin.  Pre-Exam Physical: Performed Jan 09, 2004  Entire physical exam was normal.  Exam Exam Info: Maximum depth of insertion Duodenum, intended Duodenum. Vocal cords visualized. Gastric retroflexion performed. Images taken. ASA Classification: I. Tolerance: good.  Sedation Meds: Fentanyl 50 mcg. given IV. Versed 5 mg. given IV.  Monitoring: BP and pulse monitoring done. Oximetry used. Supplemental O2 given  Fluoroscopy: Fluoroscopy was not used.  Findings - ESOPHAGEAL INFLAMMATION: suspected as a result of reflux. Severity is moderate, erosions present.  Length of inflammation: 2 cm. [place] Classification: Grade B. ICD9: Esophagitis, Reflux: 530.11.  - STRICTURE / STENOSIS: Distal Esophagus.  Constriction: partial. Etiology: benign due to reflux. Lumen diameter is 14 mm. ICD9: Esophageal Stricture: 530.3. Comment: linear erosions in the distal esophagus.  - Dilation: Distal Esophagus. Savary dilator used, Diameter: 12.8,14, 15,16,17, mm, Minimal Resistance, Minimal Heme present on extraction. 5  total dilators used. Patient tolerance fair. Outcome: successful. Comments: recurrent benign appearing stricture.  - HIATAL HERNIA: 3-4 cms. in length. nonreducible. ICD9: Hernia, Hiatal: 553.3.  Assessment Abnormal examination, see findings above.  Diagnoses: 530.3: Esophageal  Stricture.  553.3: Hernia, Hiatal.  530.3: Esophageal Stricture.  530.11: Esophagitis, Reflux.   Comments: s/p dilation to 17mm Events  Unplanned Intervention: No unplanned interventions were required.  Unplanned Events: There were no complications. Plans Medication(s): H2Blocker: Pepcid/Famotidine 40 mg QD, starting Jan 09, 2004   Patient Education: Patient given standard instructions for: Reflux.  Comments: Pepcid ac is not strong enough to suppress his acid reflux as evidenced by persistent erosions Disposition: After procedure patient sent to recovery.   This report was created from the original endoscopy report, which was reviewed and signed by the above listed endoscopist.

## 2010-05-04 NOTE — Medication Information (Signed)
Summary: rov/kb  Anticoagulant Therapy  Managed by: Bethena Midget, RN, BSN Referring MD: Daleen Squibb MD, Maisie Fus PCP: Margrett Rud  Supervising MD: Gala Romney MD, Reuel Boom Indication 1: Atrial Fibrillation (ICD-427.31) Lab Used: [facility] Site: [facility] INR POC 3.3 INR RANGE 2 - 3  Dietary changes: no    Health status changes: no    Bleeding/hemorrhagic complications: no    Recent/future hospitalizations: no    Any changes in medication regimen? no    Recent/future dental: no  Any missed doses?: no       Is patient compliant with meds? yes       Allergies: No Known Drug Allergies  Anticoagulation Management History:      The patient is taking warfarin and comes in today for a routine follow up visit.  Positive risk factors for bleeding include an age of 5 years or older.  The bleeding index is 'intermediate risk'.  Positive CHADS2 values include History of HTN and Age > 42 years old.  The start date was 06/03/2008.  Anticoagulation responsible provider: Gissella Niblack MD, Reuel Boom.  INR POC: 3.3.  Cuvette Lot#: [id number].  Exp: 12/2010.    Anticoagulation Management Assessment/Plan:      The patient's current anticoagulation dose is Coumadin 5 mg tabs: Use as directed by anticoagulation clinic..  The target INR is 2 - 3.  The next INR is due 10/20/2009.  Anticoagulation instructions were given to patient.  Results were reviewed/authorized by Bethena Midget, RN, BSN.  He was notified by Bethena Midget, RN, BSN.         Prior Anticoagulation Instructions: INR-2.6 Resume normal dosing scedule. Take 1/2 a tablet on Sunday and Wednesday and take 1 tablet on all other days. Return in  4 weeks  Current Anticoagulation Instructions: INR 3.3 Skip Wednesdays dose then resume 5mg s daily except 2.5mg s on Sundays and Wednesdays. Recheck in 3 weeks.

## 2010-05-04 NOTE — Medication Information (Signed)
Summary: rov/ewj  Anticoagulant Therapy  Managed by: Bethena Midget, RN, BSN Referring MD: Daleen Squibb MD, Maisie Fus PCP: Margrett Rud  Supervising MD: Eden Emms MD, Theron Arista Indication 1: Atrial Fibrillation (ICD-427.31) Lab Used: [facility] Site: [facility] INR POC 3.2 INR RANGE 2 - 3  Dietary changes: no    Health status changes: no    Bleeding/hemorrhagic complications: no    Recent/future hospitalizations: no    Any changes in medication regimen? yes       Details: Pennsaid topical BID for arthritic knee pain  Recent/future dental: no  Any missed doses?: no       Is patient compliant with meds? yes       Allergies: No Known Drug Allergies  Anticoagulation Management History:      The patient is taking warfarin and comes in today for a routine follow up visit.  Positive risk factors for bleeding include an age of 75 years or older.  The bleeding index is 'intermediate risk'.  Positive CHADS2 values include History of HTN and Age > 28 years old.  The start date was 06/03/2008.  Anticoagulation responsible provider: Eden Emms MD, Theron Arista.  INR POC: 3.2.  Cuvette Lot#: [id number].  Exp: 11/2010.    Anticoagulation Management Assessment/Plan:      The patient's current anticoagulation dose is Coumadin 5 mg tabs: Use as directed by anticoagulation clinic..  The target INR is 2 - 3.  The next INR is due 09/01/2009.  Anticoagulation instructions were given to patient.  Results were reviewed/authorized by Bethena Midget, RN, BSN.  He was notified by Bethena Midget, RN, BSN.         Prior Anticoagulation Instructions: INR 2.3  Continue on same dosage 1 tablet daily except 1/2 tablet on Sundays and Wednesdays.  Recheck in 4 weeks.    Current Anticoagulation Instructions: INR 3.2 Skip Wednesday's dose then resume 5mg s daily except 2.5mg s on Sundays and Wednesdays. Recheck in 3 weeks.

## 2010-05-04 NOTE — Procedures (Signed)
Summary: Upper Endoscopy  Patient: [name] Note: All result statuses are Final unless otherwise noted.  Tests: (1) Upper Endoscopy (EGD)   EGD Upper Endoscopy       DONE     [facility]     [address]     [place], [place]  [address]           ENDOSCOPY PROCEDURE REPORT           PATIENT:  Gregory Werner, Gregory Werner  MR#:  [id number]     BIRTHDATE:  17-Feb-1925, 84 yrs. old  GENDER:  male           ENDOSCOPIST:  Hedwig Morton. Juanda Chance, MD     Referred by:  Thayer Headings, M.D.           PROCEDURE DATE:  10/23/2009     PROCEDURE:  EGD with dilatation over guidewire     ASA CLASS:  Class II     INDICATIONS:  dysphagia pt on Coumadin, d/c'ed 4 days ago     hx of es.stricture 1988, s/p dil in 1993,2001,2005           MEDICATIONS:   Versed 3 mg, Fentanyl 25 mcg     TOPICAL ANESTHETIC:  Exactacain Spray           DESCRIPTION OF PROCEDURE:   After the risks benefits and     alternatives of the procedure were thoroughly explained, informed     consent was obtained.  The [id number] endoscope was     introduced through the mouth and advanced to the second portion of     the duodenum, without limitations.  The instrument was slowly     withdrawn as the mucosa was fully examined.     <<PROCEDUREIMAGES>>           A stricture was found in the distal esophagus (see image1 and     image2). benign appearing 14 mm stricture, no erosions Savary     dilation over a guidewire 15,16,17 mm dilators passed without     difficulty  A hiatal hernia was found (see image5 and image1). 3     cm nonreducible hiatal hernia  Otherwise the examination was     normal (see image3, image4, and image6).    Retroflexed views     revealed no abnormalities.    The scope was then withdrawn from     the patient and the procedure completed.           COMPLICATIONS:  None           ENDOSCOPIC IMPRESSION:     1) Stricture in the distal esophagus     2) Hiatal hernia     3) Otherwise normal examination  s/p Savary dilation to 17 mm     RECOMMENDATIONS:     1) Anti-reflux regimen to be follow     continue Famotidine     Resume Coumadin tomorrow           REPEAT EXAM:  In 0 year(s) for.  redilation prn           ______________________________     Hedwig Morton. Juanda Chance, MD           CC:           n.     eSIGNED:   Hedwig Morton. Lora Chavers at 10/26/2009 02:39 PM           Gregory Werner, Gregory Werner, [id number]  Note: An exclamation mark Marland Kitchen)  indicates a result that was not dispersed into the flowsheet. Document Creation Date: 10/26/2009 2:41 PM _______________________________________________________________________  (1) Order result status: Final Collection or observation date-time: 10/23/2009 16:37 Requested date-time:  Receipt date-time:  Reported date-time:  Referring Physician:   Ordering Physician: Lina Sar [phone]) Specimen Source:  Source: Launa Grill Order Number: [id number] Lab site:

## 2010-05-04 NOTE — Medication Information (Signed)
Summary: rov/sp  Anticoagulant Therapy  Managed by: Bethena Midget, RN, BSN Referring MD: Daleen Squibb MD, Maisie Fus PCP: Shary Decamp, MD Supervising MD: Eden Emms MD, Theron Arista Indication 1: Atrial Fibrillation (ICD-427.31) Lab Used: [facility] Site: [facility] INR POC 2.4 INR RANGE 2 - 3  Dietary changes: no    Health status changes: no    Bleeding/hemorrhagic complications: no    Recent/future hospitalizations: no    Any changes in medication regimen? no    Recent/future dental: no  Any missed doses?: no       Is patient compliant with meds? yes       Allergies: No Known Drug Allergies  Anticoagulation Management History:      The patient is taking warfarin and comes in today for a routine follow up visit.  Positive risk factors for bleeding include an age of 71 years or older.  The bleeding index is 'intermediate risk'.  Positive CHADS2 values include History of HTN and Age > 75 years old.  The start date was 06/03/2008.  Anticoagulation responsible provider: Eden Emms MD, Theron Arista.  INR POC: 2.4.  Cuvette Lot#: [id number].  Exp: 01/2011.    Anticoagulation Management Assessment/Plan:      The patient's current anticoagulation dose is Warfarin sodium 5 mg tabs: Use as directed by Anticoagulation Clinic.  The target INR is 2 - 3.  The next INR is due 04/06/2010.  Anticoagulation instructions were given to patient.  Results were reviewed/authorized by Bethena Midget, RN, BSN.  He was notified by Bethena Midget, RN, BSN.         Prior Anticoagulation Instructions: INR 3.1  Decrease dose to 1 tablet every day except 1/2 tablet on Sunday, Wednesday and Friday.  Recheck INR in 3 weeks.   Current Anticoagulation Instructions: INR 2.4 Continue 1 pill everyday except 1/2 pill on Sundays, Wednesdays and Fridays. Recheck in 4 weeks.

## 2010-05-04 NOTE — Medication Information (Signed)
Summary: rov/sp  Anticoagulant Therapy  Managed by: Weston Brass, PharmD Referring MD: Daleen Squibb MD, Maisie Fus PCP: Shary Decamp, MD Supervising MD: Excell Seltzer MD, Casimiro Needle Indication 1: Atrial Fibrillation (ICD-427.31) Lab Used: [facility] Site: [facility] INR POC 3.1 INR RANGE 2 - 3  Dietary changes: yes       Details: extra cabbage lately  Health status changes: no    Bleeding/hemorrhagic complications: no    Recent/future hospitalizations: no    Any changes in medication regimen? no    Recent/future dental: no  Any missed doses?: no       Is patient compliant with meds? yes       Allergies: No Known Drug Allergies  Anticoagulation Management History:      The patient is taking warfarin and comes in today for a routine follow up visit.  Positive risk factors for bleeding include an age of 75 years or older.  The bleeding index is 'intermediate risk'.  Positive CHADS2 values include History of HTN and Age > 75 years old.  The start date was 06/03/2008.  Anticoagulation responsible provider: Excell Seltzer MD, Casimiro Needle.  INR POC: 3.1.  Cuvette Lot#: [id number].  Exp: 03/2011.    Anticoagulation Management Assessment/Plan:      The patient's current anticoagulation dose is Warfarin sodium 5 mg tabs: Use as directed by Anticoagulation Clinic.  The target INR is 2 - 3.  The next INR is due 03/09/2010.  Anticoagulation instructions were given to patient.  Results were reviewed/authorized by Weston Brass, PharmD.  He was notified by Weston Brass PharmD.         Prior Anticoagulation Instructions: INR 3.4  Skip tomorrow's dose of Coumadin then resume same dose of 1 tablet every day except 1/2 tablet on Sunday and Wednesday.  Eat an extra serving of green vegetables.  Recheck INR in 3 weeks.   Current Anticoagulation Instructions: INR 3.1  Decrease dose to 1 tablet every day except 1/2 tablet on Sunday, Wednesday and Friday.  Recheck INR in 3 weeks.

## 2010-05-04 NOTE — Medication Information (Signed)
Summary: rov/tm  Anticoagulant Therapy  Managed by: Weston Brass, PharmD Referring MD: Daleen Squibb MD, Maisie Fus PCP: Shary Decamp, MD Supervising MD: Graciela Husbands MD, Viviann Spare Indication 1: Atrial Fibrillation (ICD-427.31) Lab Used: [facility] Site: [facility] INR POC 2.9 INR RANGE 2 - 3  Dietary changes: no    Health status changes: no    Bleeding/hemorrhagic complications: no    Recent/future hospitalizations: yes       Details: scheduled for colonoscopy this Friday 7/22  Any changes in medication regimen? no    Recent/future dental: no  Any missed doses?: yes     Details: last dose of coumadin was yesterday 7/18 in preparation for colonoscopy  Is patient compliant with meds? yes       Allergies: No Known Drug Allergies  Anticoagulation Management History:      The patient is taking warfarin and comes in today for a routine follow up visit.  Positive risk factors for bleeding include an age of 46 years or older.  The bleeding index is 'intermediate risk'.  Positive CHADS2 values include History of HTN and Age > 83 years old.  The start date was 06/03/2008.  Anticoagulation responsible provider: Graciela Husbands MD, Viviann Spare.  INR POC: 2.9.  Cuvette Lot#: [id number].  Exp: 01/2011.    Anticoagulation Management Assessment/Plan:      The patient's current anticoagulation dose is Coumadin 5 mg tabs: Use as directed by anticoagulation clinic..  The target INR is 2 - 3.  The next INR is due 10/27/2009.  Anticoagulation instructions were given to patient.  Results were reviewed/authorized by Weston Brass, PharmD.  He was notified by Dillard Cannon.         Prior Anticoagulation Instructions: INR 3.3 Skip Wednesdays dose then resume 5mg s daily except 2.5mg s on Sundays and Wednesdays. Recheck in 3 weeks.   Current Anticoagulation Instructions: INR 2.9  Hold coumadin today through Thursday.  After procedure on Friday, take 1 tab daily.  Re-check INR on 7/26.

## 2010-05-04 NOTE — Letter (Signed)
Summary: EGD Instructions  [facility]  [address] [place], [place] [address]   Phone: [phone]  Fax: [phone]       Gregory Werner    12-30-1924    MRN: [id number]       Procedure Day /Date: Friday 10-23-09     Arrival Time:  2:30 p.m.     Procedure Time: 3:30 p.m.     Location of Procedure:                    _x _ [facility] (4th Floor)   PREPARATION FOR ENDOSCOPY   On  FRIDAY 10-23-09, THE DAY OF THE PROCEDURE:  1.   No solid foods, milk or milk products are allowed after midnight the night before your procedure.  2.   Do not drink anything colored red or purple.  Avoid juices with pulp.  No orange juice.  3.  You may drink clear liquids until 1:30 p.m., which is 2 hours before your procedure.                                                                                                CLEAR LIQUIDS INCLUDE: Water Jello Ice Popsicles Tea (sugar ok, no milk/cream) Powdered fruit flavored drinks Coffee (sugar ok, no milk/cream) Gatorade Juice: apple, white grape, white cranberry  Lemonade Clear bullion, consomm, broth Carbonated beverages (any kind) Strained chicken noodle soup Hard Candy   MEDICATION INSTRUCTIONS  Unless otherwise instructed, you should take regular prescription medications with a small sip of water as early as possible the morning of your procedure.  Diabetic patients - see separate instructions.   Stop taking Coumadin per Dr.Brodies intructions,hold until we tell you  (5 days before procedure).  Additional medication instructions: _             OTHER INSTRUCTIONS  You will need a responsible adult at least 75 years of age to accompany you and drive you home.   This person must remain in the waiting room during your procedure.  Wear loose fitting clothing that is easily removed.  Leave jewelry and other valuables at home.  However, you may wish to bring a book to read or an iPod/MP3 player to  listen to music as you wait for your procedure to start.  Remove all body piercing jewelry and leave at home.  Total time from sign-in until discharge is approximately 2-3 hours.  You should go home directly after your procedure and rest.  You can resume normal activities the day after your procedure.  The day of your procedure you should not:   Drive   Make legal decisions   Operate machinery   Drink alcohol   Return to work  You will receive specific instructions about eating, activities and medications before you leave.    The above instructions have been reviewed and explained to me by   Sherren Kerns RN  October 20, 2009 2:38 PM    I fully understand and can verbalize these instructions _____________________________ Date _________

## 2010-05-04 NOTE — Medication Information (Signed)
Summary: rov/ewj  Anticoagulant Therapy  Managed by: Cloyde Reams, RN, BSN PCP: Margrett Rud  Supervising MD: Graciela Husbands MD, Viviann Spare Indication 1: Atrial Fibrillation (ICD-427.31) Lab Used: [facility] Site: [facility] INR POC 2.3 INR RANGE 2 - 3  Dietary changes: no    Health status changes: no    Bleeding/hemorrhagic complications: no    Recent/future hospitalizations: no    Any changes in medication regimen? no    Recent/future dental: no  Any missed doses?: no       Is patient compliant with meds? yes       Allergies (verified): No Known Drug Allergies  Anticoagulation Management History:      The patient is taking warfarin and comes in today for a routine follow up visit.  Positive risk factors for bleeding include an age of 75 years or older.  The bleeding index is 'intermediate risk'.  Positive CHADS2 values include History of HTN and Age > 6 years old.  The start date was 06/03/2008.  Anticoagulation responsible provider: Graciela Husbands MD, Viviann Spare.  INR POC: 2.3.  Cuvette Lot#: [id number].  Exp: 07/2010.    Anticoagulation Management Assessment/Plan:      The patient's current anticoagulation dose is Coumadin 5 mg tabs: Use as directed by anticoagulation clinic..  The target INR is 2 - 3.  The next INR is due 06/16/2009.  Anticoagulation instructions were given to patient.  Results were reviewed/authorized by Cloyde Reams, RN, BSN.  He was notified by Cloyde Reams RN.         Prior Anticoagulation Instructions: INR 2.4  Continue on same dosage 1 tablet daily except 1/2 tablet on Sundays and Wednesdays.  Recheck in 4 weeks.    Current Anticoagulation Instructions: INR 2.3  Continue on same dosage 1 tablet daily except 1/2 tablet on Sundays and Wednesdays.  Recheck in 4 weeks.

## 2010-05-04 NOTE — Medication Information (Signed)
Summary: rov/tm  Anticoagulant Therapy  Managed by: Weston Brass, PharmD Referring MD: Daleen Squibb MD, Maisie Fus PCP: Shary Decamp, MD Supervising MD: Juanda Chance MD, Bruce Indication 1: Atrial Fibrillation (ICD-427.31) Lab Used: [facility] Site: [facility] INR POC 2.1 INR RANGE 2 - 3  Dietary changes: no    Health status changes: no    Bleeding/hemorrhagic complications: no    Recent/future hospitalizations: no    Any changes in medication regimen? no    Recent/future dental: no  Any missed doses?: no       Is patient compliant with meds? yes       Allergies: No Known Drug Allergies  Anticoagulation Management History:      The patient is taking warfarin and comes in today for a routine follow up visit.  Positive risk factors for bleeding include an age of 75 years or older.  The bleeding index is 'intermediate risk'.  Positive CHADS2 values include History of HTN and Age > 14 years old.  The start date was 06/03/2008.  Anticoagulation responsible Maddax Palinkas: Juanda Chance MD, Smitty Cords.  INR POC: 2.1.  Cuvette Lot#: [id number].  Exp: 01/2011.    Anticoagulation Management Assessment/Plan:      The patient's current anticoagulation dose is Coumadin 5 mg tabs: Use as directed by anticoagulation clinic..  The target INR is 2 - 3.  The next INR is due 12/01/2009.  Anticoagulation instructions were given to patient.  Results were reviewed/authorized by Weston Brass, PharmD.  He was notified by Weston Brass PharmD.         Prior Anticoagulation Instructions: INR 1.4 Continue 5mg s everyday except 2.5mg s on Sundays and Wednesdays. Recheck in 7-10days.   Current Anticoagulation Instructions: INR 2.1  Continue same dose of 1 tablet every day except 1/2 tablet on Sunday and Wednesday.

## 2010-05-04 NOTE — Medication Information (Signed)
Summary: rov/sp  Anticoagulant Therapy  Managed by: Cloyde Reams, RN, BSN Referring MD: Daleen Squibb MD, Maisie Fus PCP: Shary Decamp, MD Supervising MD: Eden Emms MD, Theron Arista Indication 1: Atrial Fibrillation (ICD-427.31) Lab Used: [facility] Site: [facility] INR POC 2.8 INR RANGE 2 - 3  Dietary changes: yes       Details: Ate spiniach and broccoli yesterday  Health status changes: no    Bleeding/hemorrhagic complications: no    Recent/future hospitalizations: no    Any changes in medication regimen? no    Recent/future dental: no  Any missed doses?: no       Is patient compliant with meds? yes       Allergies: No Known Drug Allergies  Anticoagulation Management History:      The patient is taking warfarin and comes in today for a routine follow up visit.  Positive risk factors for bleeding include an age of 75 years or older.  The bleeding index is 'intermediate risk'.  Positive CHADS2 values include History of HTN and Age > 52 years old.  The start date was 06/03/2008.  Anticoagulation responsible provider: Eden Emms MD, Theron Arista.  INR POC: 2.8.  Cuvette Lot#: [id number].  Exp: 01/2011.    Anticoagulation Management Assessment/Plan:      The patient's current anticoagulation dose is Warfarin sodium 5 mg tabs: Use as directed by Anticoagulation Clinic.  The target INR is 2 - 3.  The next INR is due 12/29/2009.  Anticoagulation instructions were given to patient.  Results were reviewed/authorized by Cloyde Reams, RN, BSN.  He was notified by Cloyde Reams RN.         Prior Anticoagulation Instructions: INR 2.1  Continue same dose of 1 tablet every day except 1/2 tablet on Sunday and Wednesday.   Current Anticoagulation Instructions: INR 2.8  Continue on same dosage 1 tablet daily except 1/2 tablet on Sundays and Wednesdays.  Recheck in 4 weeks.

## 2010-05-05 DIAGNOSIS — R918 Other nonspecific abnormal finding of lung field: Secondary | ICD-10-CM

## 2010-05-05 HISTORY — DX: Other nonspecific abnormal finding of lung field: R91.8

## 2010-05-06 NOTE — Medication Information (Signed)
Summary: rov/tm  Anticoagulant Therapy  Managed by: Weston Brass, PharmD Referring MD: Daleen Squibb MD, Maisie Fus PCP: Shary Decamp, MD Supervising MD: Jens Som MD, Arlys John Indication 1: Atrial Fibrillation (ICD-427.31) Lab Used: [facility] Site: [facility] INR POC 2.5 INR RANGE 2 - 3  Dietary changes: no    Health status changes: no    Bleeding/hemorrhagic complications: no    Recent/future hospitalizations: no    Any changes in medication regimen? no    Recent/future dental: no  Any missed doses?: no       Is patient compliant with meds? yes       Allergies: No Known Drug Allergies  Anticoagulation Management History:      The patient is taking warfarin and comes in today for a routine follow up visit.  Positive risk factors for bleeding include an age of 12 years or older.  The bleeding index is 'intermediate risk'.  Positive CHADS2 values include History of HTN and Age > 75 years old.  The start date was 06/03/2008.  Anticoagulation responsible provider: Jens Som MD, Arlys John.  INR POC: 2.5.  Cuvette Lot#: [id number].  Exp: 05/2011.    Anticoagulation Management Assessment/Plan:      The patient's current anticoagulation dose is Warfarin sodium 5 mg tabs: Use as directed by Anticoagulation Clinic.  The target INR is 2 - 3.  The next INR is due 05/04/2010.  Anticoagulation instructions were given to patient.  Results were reviewed/authorized by Weston Brass, PharmD.  He was notified by Weston Brass PharmD.         Prior Anticoagulation Instructions: INR 2.4 Continue 1 pill everyday except 1/2 pill on Sundays, Wednesdays and Fridays. Recheck in 4 weeks.   Current Anticoagulation Instructions: INR 2.5  Continue same dose of 1 tablet every day except 1/2 tablet on Sunday, Wednesday and Friday.  Recheck INR in 4 weeks.

## 2010-05-12 NOTE — Medication Information (Signed)
Summary: rov/sp  Anticoagulant Therapy  Managed by: Weston Brass, PharmD Referring MD: Daleen Squibb MD, Maisie Fus PCP: Shary Decamp, MD Supervising MD: Myrtis Ser MD, Tinnie Gens Indication 1: Atrial Fibrillation (ICD-427.31) Lab Used: [facility] Site: [facility] INR POC 2.1 INR RANGE 2 - 3  Dietary changes: no    Health status changes: no    Bleeding/hemorrhagic complications: no    Recent/future hospitalizations: no    Any changes in medication regimen? no    Recent/future dental: no  Any missed doses?: no       Is patient compliant with meds? yes       Allergies: No Known Drug Allergies  Anticoagulation Management History:      The patient is taking warfarin and comes in today for a routine follow up visit.  Positive risk factors for bleeding include an age of 75 years or older.  The bleeding index is 'intermediate risk'.  Positive CHADS2 values include History of HTN and Age > 57 years old.  The start date was 06/03/2008.  Anticoagulation responsible provider: Myrtis Ser MD, Tinnie Gens.  INR POC: 2.1.  Cuvette Lot#: [id number].  Exp: 05/2011.    Anticoagulation Management Assessment/Plan:      The patient's current anticoagulation dose is Warfarin sodium 5 mg tabs: Use as directed by Anticoagulation Clinic.  The target INR is 2 - 3.  The next INR is due 06/01/2010.  Anticoagulation instructions were given to patient.  Results were reviewed/authorized by Weston Brass, PharmD.  He was notified by Linward Headland, PharmD candidate.         Prior Anticoagulation Instructions: INR 2.5  Continue same dose of 1 tablet every day except 1/2 tablet on Sunday, Wednesday and Friday.  Recheck INR in 4 weeks.   Current Anticoagulation Instructions: INR 2.1 (INR goal: 2-3)  Take 1 tablet everyday except 1/2 tablet on Sundays, Wednesdays, and Fridays.  Recheck in 4 weeks.

## 2010-05-18 DIAGNOSIS — I4891 Unspecified atrial fibrillation: Secondary | ICD-10-CM

## 2010-06-01 ENCOUNTER — Encounter (INDEPENDENT_AMBULATORY_CARE_PROVIDER_SITE_OTHER): Payer: Medicare Other

## 2010-06-01 ENCOUNTER — Encounter: Payer: Self-pay | Admitting: Cardiology

## 2010-06-01 DIAGNOSIS — I4891 Unspecified atrial fibrillation: Secondary | ICD-10-CM

## 2010-06-01 DIAGNOSIS — Z7901 Long term (current) use of anticoagulants: Secondary | ICD-10-CM

## 2010-06-01 LAB — CONVERTED CEMR LAB: POC INR: 2.4

## 2010-06-10 NOTE — Medication Information (Signed)
Summary: rov  Anticoagulant Therapy  Managed by: Weston Brass, PharmD Referring MD: Daleen Squibb MD, Maisie Fus PCP: Shary Decamp, MD Supervising MD: Shirlee Latch MD, Lyndall Bellot Indication 1: Atrial Fibrillation (ICD-427.31) Lab Used: [facility] Site: [facility] INR POC 2.4 INR RANGE 2 - 3  Dietary changes: no    Health status changes: no    Bleeding/hemorrhagic complications: no    Recent/future hospitalizations: no    Any changes in medication regimen? no    Recent/future dental: no  Any missed doses?: no       Is patient compliant with meds? yes       Allergies: No Known Drug Allergies  Anticoagulation Management History:      The patient is taking warfarin and comes in today for a routine follow up visit.  Positive risk factors for bleeding include an age of 75 years or older.  The bleeding index is 'intermediate risk'.  Positive CHADS2 values include History of HTN and Age > 77 years old.  The start date was 06/03/2008.  Anticoagulation responsible provider: Shirlee Latch MD, Ivalee Strauser.  INR POC: 2.4.  Cuvette Lot#: [id number].  Exp: 04/2011.    Anticoagulation Management Assessment/Plan:      The patient's current anticoagulation dose is Warfarin sodium 5 mg tabs: Use as directed by Anticoagulation Clinic.  The target INR is 2 - 3.  The next INR is due 06/29/2010.  Anticoagulation instructions were given to patient.  Results were reviewed/authorized by Weston Brass, PharmD.  He was notified by Margot Chimes PharmD Candidate.         Prior Anticoagulation Instructions: INR 2.1 (INR goal: 2-3)  Take 1 tablet everyday except 1/2 tablet on Sundays, Wednesdays, and Fridays.  Recheck in 4 weeks.  Current Anticoagulation Instructions: INR 2.4  Continue your current dose of 1 tablet everyday except on Sundays, Wednesdays, and Fridays when you take 1/2 tablet.  Recheck INR in 4 weeks.

## 2010-06-29 ENCOUNTER — Ambulatory Visit (INDEPENDENT_AMBULATORY_CARE_PROVIDER_SITE_OTHER): Payer: Medicare Other | Admitting: *Deleted

## 2010-06-29 DIAGNOSIS — Z7901 Long term (current) use of anticoagulants: Secondary | ICD-10-CM | POA: Insufficient documentation

## 2010-06-29 DIAGNOSIS — I4891 Unspecified atrial fibrillation: Secondary | ICD-10-CM

## 2010-06-29 LAB — POCT INR: INR: 2.3

## 2010-06-29 NOTE — Patient Instructions (Signed)
Continue on same dosage 1 tablet daily except 1/2 tablet on Sundays, Wednesdays, and Fridays.  Recheck in 4 weeks.

## 2010-07-20 LAB — DIFFERENTIAL
Basophils Relative: 0 % (ref 0–1)
Eosinophils Absolute: 0.1 10*3/uL (ref 0.0–0.7)
Lymphs Abs: 1.2 10*3/uL (ref 0.7–4.0)
Neutrophils Relative %: 79 % — ABNORMAL HIGH (ref 43–77)

## 2010-07-20 LAB — LIPID PANEL
HDL: 64 mg/dL (ref >39)
Total CHOL/HDL Ratio: 2.7 RATIO
Triglycerides: 34 mg/dL (ref <150)
VLDL: 7 mg/dL (ref 0–40)

## 2010-07-20 LAB — CK TOTAL AND CKMB (NOT AT ARMC)
CK, MB: 2.8 ng/mL (ref 0.3–4.0)
Relative Index: 2.5 (ref 0.0–2.5)

## 2010-07-20 LAB — COMPREHENSIVE METABOLIC PANEL
ALT: 19 U/L (ref 0–53)
Albumin: 3.2 g/dL — ABNORMAL LOW (ref 3.5–5.2)
Alkaline Phosphatase: 49 U/L (ref 39–117)
Potassium: 4.6 mEq/L (ref 3.5–5.1)
Sodium: 136 mEq/L (ref 135–145)
Total Protein: 6 g/dL (ref 6.0–8.3)

## 2010-07-20 LAB — CARDIAC PANEL(CRET KIN+CKTOT+MB+TROPI)
Relative Index: INVALID (ref 0.0–2.5)
Total CK: 90 U/L (ref 7–232)
Troponin I: <0.01 ng/mL (ref 0.00–0.06)

## 2010-07-20 LAB — BASIC METABOLIC PANEL
Calcium: 8.8 mg/dL (ref 8.4–10.5)
Creatinine, Ser: 1.08 mg/dL (ref 0.4–1.5)
GFR calc Af Amer: >60 mL/min (ref >60)

## 2010-07-20 LAB — CBC
Hemoglobin: 12.8 g/dL — ABNORMAL LOW (ref 13.0–17.0)
MCHC: 34.5 g/dL (ref 30.0–36.0)
MCV: 95 fL (ref 78.0–100.0)
Platelets: 198 10*3/uL (ref 150–400)
RBC: 3.94 MIL/uL — ABNORMAL LOW (ref 4.22–5.81)

## 2010-07-20 LAB — TROPONIN I: Troponin I: <0.01 ng/mL (ref 0.00–0.06)

## 2010-07-20 LAB — TSH: TSH: 2.398 u[IU]/mL (ref 0.350–4.500)

## 2010-07-23 ENCOUNTER — Encounter: Payer: Self-pay | Admitting: Cardiology

## 2010-07-26 ENCOUNTER — Ambulatory Visit (INDEPENDENT_AMBULATORY_CARE_PROVIDER_SITE_OTHER): Payer: Medicare Other | Admitting: Cardiology

## 2010-07-26 ENCOUNTER — Encounter: Payer: Self-pay | Admitting: Cardiology

## 2010-07-26 ENCOUNTER — Ambulatory Visit (INDEPENDENT_AMBULATORY_CARE_PROVIDER_SITE_OTHER): Payer: Medicare Other | Admitting: *Deleted

## 2010-07-26 VITALS — BP 132/72 | HR 50 | Resp 14 | Ht 70.0 in | Wt 157.0 lb

## 2010-07-26 DIAGNOSIS — I4891 Unspecified atrial fibrillation: Secondary | ICD-10-CM

## 2010-07-26 DIAGNOSIS — Z7901 Long term (current) use of anticoagulants: Secondary | ICD-10-CM

## 2010-07-26 LAB — POCT INR: INR: 2.3

## 2010-07-26 NOTE — Assessment & Plan Note (Signed)
Stable, no change in treatment.

## 2010-07-26 NOTE — Patient Instructions (Signed)
Your physician recommends that you schedule a follow-up appointment in: 12 months with Dr Wall  

## 2010-07-26 NOTE — Assessment & Plan Note (Signed)
Stable, INR 2.3 today. Will need to stop for 3 days prior to knee surgery.

## 2010-07-26 NOTE — Progress Notes (Signed)
   Patient ID: Gregory Werner, male    DOB: [birth year], 77 y.o.   MRN: [id number]  HPI  Gregory Werner returns for E and M of his CAF and anticoagulation. He is totally asymptomatic. No bleeding or melena reported. He may need elective left knee surgery soon. We will coordinate his Coumadin. He will not need overlap.  EKG shows CAF with IRBBB, no change.   Review of Systems  All other systems reviewed and are negative.      Physical Exam  Nursing note and vitals reviewed. Constitutional: He is oriented to person, place, and time. He appears well-developed and well-nourished. No distress.  HENT:  Head: Normocephalic and atraumatic.  Eyes: EOM are normal. Pupils are equal, round, and reactive to light.  Neck: Normal range of motion. No JVD present. No tracheal deviation present. No thyromegaly present.       No bruits  Cardiovascular: An irregularly irregular rhythm present.  No extrasystoles are present. Bradycardia present.  PMI is not displaced.   Murmur heard.  Systolic murmur is present with a grade of 2/6  Pulses:      Carotid pulses are 2+ on the right side, and 2+ on the left side.      Radial pulses are 2+ on the right side, and 2+ on the left side.       Femoral pulses are 2+ on the right side, and 2+ on the left side.      Popliteal pulses are 2+ on the right side, and 2+ on the left side.       Dorsalis pedis pulses are 2+ on the right side, and 2+ on the left side.       Posterior tibial pulses are 2+ on the right side, and 2+ on the left side.  Pulmonary/Chest: Effort normal and breath sounds normal.  Abdominal: Soft. Bowel sounds are normal.  Musculoskeletal: He exhibits no edema.       Decreased ROM  Neurological: He is alert and oriented to person, place, and time.  Skin: Skin is warm and dry.  Psychiatric: He has a normal mood and affect.

## 2010-07-27 ENCOUNTER — Encounter: Payer: BC Managed Care – PPO | Admitting: *Deleted

## 2010-08-17 NOTE — Discharge Summary (Signed)
NAME:  Gregory Werner, GIPE NO.:  [id number]   MEDICAL RECORD NO.:  [id number]          PATIENT TYPE:  INP   LOCATION:  2023                         FACILITY:  [facility]   PHYSICIAN:  Jesse Sans. Wall, MD, FACCDATE OF BIRTH:  [birth year]   DATE OF ADMISSION:  05/10/2008  DATE OF DISCHARGE:  05/12/2008                               DISCHARGE SUMMARY   PRIMARY CARDIOLOGIST:  Maisie Fus C. Daleen Squibb, MD, [facility]   PRIMARY CARE Ruthell Feigenbaum:  Lorelle Formosa, MD   DISCHARGING DIAGNOSES:  1. Atrial fibrillation with wide-complex ventricular rate, unclear      duration.  The patient agreeing to aspirin therapy only at the time      of discharge after discussion with Dr. Daleen Squibb.  2. Left lower lobe infiltrate, questionable pneumonia.  The patient is      being treated with fluoroquinolone.  3. Chest pain, most likely secondary to left lower lobe infiltrate,      questionable pneumonia.   HOSPITAL COURSE:  Mr. Gregory Werner is an 77 year old gentleman with a history of  hypertension, prostate cancer, skin cancer, GERD, probable atrial fib  who presented on day of admission complaining of chest discomfort, not  associated with any activity or position, not even when he rode his  exercise bike.  He described it as a tightness, such as, a band around  his left chest.  The pain persisted.  The patient presented to urgent  care center and was found to be in atrial fib.  He was sent to [facility] Emergency Room for further evaluation.  Apparently, he had a  history of palpitations, possible arrhythmia in the past.  The patient  states he was treated with quinidine for.  He does not currently carry a  diagnosis of atrial fib, no previous cardiac catheterization history.  The patient was admitted for observation, rule out for ACS by cardiac  enzymes.  The patient was started on Lovenox, though he does have a  CHADS score of 2, making him a possible Coumadin candidate.  Anticoagulation therapy was discussed  with the patient by Dr. Daleen Squibb with  plans to continue with full dose of aspirin at this time.  Chest x-ray  obtained showing suspected mild left basilar/left lower lobe infiltrate  posteriorly, no definite pleural effusion or pneumothorax noted.  The  patient did have a low-grade temperature during hospitalization.  Dr.  Daleen Squibb in to see the patient on day of discharge.  The patient continues  to remain in atrial fib at a controlled rate, low-grade temperature at  99.7 within the last 24 hours.  The patient being discharged home to  follow up with Dr. Daleen Squibb within the next 2 weeks.  At the time of  discharge, the patient has been given a prescription for Avelox 4 mg  p.o. x10 more days, Hytrin 2 mg at bedtime, continued aspirin has been  increased to 325 mg daily, Pepcid 20 mg daily.  The patient's Ramipril  has been decreased to 2.5 mg daily; previously, he was on 5 mg prior to  this hospitalization, not quite clear  why dose was cut in half, although  the patient's blood pressure has remained well controlled at 110/60s.  Schedule the patient to follow  up with Dr. Daleen Squibb on June 03, 2008, at 3:15.  Okay for the patient to  continue his vitamin supplements as previously taken.   DURATION OF DISCHARGE ENCOUNTER:  Greater than 30 minutes.      Dorian Pod, ACNP      Jesse Sans. Daleen Squibb, MD, [facility]  Electronically Signed    MB/MEDQ  D:  05/12/2008  T:  05/12/2008  Job:  [id number]   cc:   Lorelle Formosa, M.D.

## 2010-08-17 NOTE — H&P (Signed)
NAME:  Gregory Werner, Gregory Werner NO.:  [id number]   MEDICAL RECORD NO.:  [id number]          PATIENT TYPE:  INP   LOCATION:  2023                         FACILITY:  [facility]   PHYSICIAN:  Dr. Lalla Brothers            DATE OF BIRTH:  June 04, 1924   DATE OF ADMISSION:  05/10/2008  DATE OF DISCHARGE:                              HISTORY & PHYSICAL   PRIMARY CARDIOLOGY:  Maisie Fus C. Wall, MD, [facility].   PRIMARY CARE PHYSICIAN:  Lorelle Formosa, M.D.   CHIEF COMPLAINT:  Chest pain.   HISTORY OF PRESENT ILLNESS:  This is an 77 year old male with past  medical history of hypertension, prostate cancer, skin cancer, reflux,  and probable atrial fibrillation in the past who presents with chest  pain, starting this morning.  He woke on morning of admission with chest  pain in the left chest 2/10 to 3/10 in intensity.  The pain was not  associated with nausea, diaphoresis, or shortness of breath.  The pain  was not exacerbated by exertion, not even when he rode exercise bike  this morning.  The pain is worse with deep inspiration and with  palpation of the chest.  He describes the pain to be a chest tightness  in a band around the left chest.  The pain has been persistent  throughout the day.  Mr. Dulworth initially presented to an Urgent Care  Center and was found to be in atrial fibrillation.  He was then sent to  [facility] Emergency Department for evaluation.  He reports a positive  history of childhood murmur and a positive history of palpations,  possible arrhythmia in the past, which was treated with quinidine.  He  does not currently carry the diagnosis of atrial fibrillation.  No prior  cardiac catheterization.   PAST MEDICAL HISTORY:  1. Prostate cancer treated with radiation in 2001.  2. Skin cancer status post multiple outpatient surgical removals.  3. History of dysphagia status post esophageal dilatation in 2001.  4. Possible history of atrial fibrillation in the past.  The patient      is unclear on his previous arrhythmia, but does state that it was      treated with quinidine.   MEDICATIONS:  1. Ramipril 5 mg p.o. daily.  2. Hytrin 2.5 mg p.o. daily.  3. Aspirin 81 mg p.o. daily.  4. Pepcid.  5. Vitamin D.  6. Vitamin C.  7. Costochondroitin.   SOCIAL HISTORY:  The patient lives in [place] with his wife.  He is a  Tree surgeon.  He is a retired IT trainer.  He has several  children who are all healthy.  He has had 4 to 5-pack-year history of  smoking, but quit about 65 years ago.  He drinks 1 to 2 alcoholic  beverages a day.  He uses no drugs.  He exercises everyday.  He swims 3  times per week and often uses exercise bicycle at the [facility]  He  maintains a healthy diet.  He does not use any herbal medications other  than the one listed in his med list.   FAMILY MEDICAL HISTORY:  Positive for coronary artery disease in his  father who passed away at the age of 57 with an MI, positive for strokes  in his mother.   REVIEW OF SYSTEMS:  CONSTITUTIONAL:  Negative for fevers, chills, or  sweats.  HEENT:  Negative for headache, vision change, hearing loss.  SKIN:  Negative for any new rash or lesion.  CARDIOPULMONARY:  Positive  for chest pain.  Negative for shortness of breath.  Negative for dyspnea  on exertion.  Negative for paroxysmal nocturnal dyspnea.  Negative for  edema.  Negative for syncope or presyncope.  Positive for cough.  NEUROPSYCHIATRIC:  Negative for weakness, numbness, depression.  MUSCULOSKELETAL:  Negative for myalgias or arthralgias.  GI:  Negative  for nausea, vomiting, diarrhea, or melena.   PHYSICAL EXAMINATION:  VITAL SIGNS:  Temperature is 97.5, pulse 72,  blood pressure 136/83, and respirations 18.  The patient is sating 100%  on room air.  GENERAL:  The patient is in no acute distress.  He is comfortable on the  bed.  HEENT:  Head is normocephalic and atraumatic.  Pupils are not equal,  right being greater than the left.   They are round and reactive to  light.  Extraocular motion is intact.  Sclerae are clear.  Mucous  membranes are moist.  Dentition is good.  There is no erythema or  exudate in the posterior oropharynx.  NECK:  Supple.  No bruit.  No JVD.  No lymphadenopathy.  CARDIOVASCULAR:  There is an irregular rhythm.  S1 and S2.  No murmurs,  rubs, or gallops.  LUNGS:  Clear to auscultation with very good air movement.  ABDOMEN:  Soft and nontender.  Normal bowel sounds.  EXTREMITIES:  There is no cyanosis.  No clubbing.  No edema.  NEURO:  The patient is alert and oriented.  Cranial nerves are intact.  Strength is 5/5 and sensation is equal and intact.   OTHER DATA:  Chest x-ray is pending.  EKG shows a rate of 67 within a  regular rate and no P wave.  Axis is leftward.  There is no sign of  ischemic change.   LABS:  White count 9.1, hemoglobin 12.8, hematocrit 37.5, platelets 224.  Sodium 136, potassium 4.6, chloride 101, bicarb 27, BUN 19, creatinine  1.06, glucose 108, total bili 0.9, alk phos 49, AST 22, ALT 19, protein  6, albumin 2.2.  Initial cardiac markers, CK 113, MB 2.8, troponin less  than 0.01.   ASSESSMENT AND PLAN:  This is a fairly healthy 77 year old male with  past medical history positive for undefined arrhythmia.  There is an EKG  from 2007 showing sinus bradycardia, also past medical history of  hypertension.   PLAN:  1. Admit to telemetry, rule out for ACS with cardiac enzymes.  At this      point, acute coronary syndrome is not suspected.  2. Monitor on telemetry for change in rhythm, suspected that this is      sick sinus syndrome.  We will check a 2-D echo in the morning.      Check TSH.  Initiate therapeutic Lovenox and discussed possible      Coumadin therapy      with the patient as he is in atrial fibrillation.  His risk factors      include age of 38 and history of hypertension giving him CHADS2  score of 2, making him a possible Coumadin candidate but  one who      would also be appropriate for aspirin 325.      Elby Showers, MD  Electronically Signed     ______________________________  Dr. Lalla Brothers    CW/MEDQ  D:  05/10/2008  T:  05/11/2008  Job:  [id number]

## 2010-08-17 NOTE — Assessment & Plan Note (Signed)
[facility]                            CARDIOLOGY OFFICE NOTE   NAME:Prowell, TIMOFEY CARANDANG                        MRN:          [id number]  DATE:06/03/2008                            DOB:          09-13-1924    Mr. Uballe comes in today for post hospital visit.  He was admitted with  respiratory tract infection, was noted to be in atrial fibrillation.  He  was not sure if he want to go on Coumadin, so he went home on aspirin  325 mg a day.   He has absolutely no symptoms with his atrial fib.   A 2-D echocardiogram on 2/10 showed moderate aortic root dilatation,  mild mitral valve regurgitation, moderate left atrial enlargement, mild  increase in pulmonary pressures, mild-to-moderate tricuspid  regurgitation, mild-to-moderate right atrial dilatation, mild aortic  valve regurgitation, EF 65-70%.  Aortic valve morphology was normal,  though it was mildly thickened.  Aortic regurgitation jet was central  suggesting this being secondary to dilatation of the aortic root.   Again, he is totally asymptomatic.   He does have a history of hypertension for about 5 years.  He takes low-  dose Ramipril.   He is currently on aspirin 325 mg a day, ramipril 2.5 mg a day,  famotidine 40 mg a day, Hytrin 2 mg nightly, fish oil daily, vitamin C  and D.   PHYSICAL EXAMINATION:  Vital Signs:  Blood pressure today is 126/80, his  pulse is 58 and regular.  His weight is 163, he is in no acute distress.  HEENT:  Normal.  Neck:  Supple.  Carotid upstrokes were equal  bilaterally without bruits, no JVD.  Thyroid is not enlarged.  Lungs:  Clear to auscultation and percussion.  Heart:  Soft S1 and S2.  Irregular rate and rhythm, could appreciate no diastolic murmur.  Abdomen:  Soft, good bowel sounds.  No midline bruit.  No hepatomegaly.  Extremities:  Without cyanosis, clubbing, or edema.  Pulses are intact.  Skin:  Chronic eczema that is diffusely over his body followed by Dr.  Leta Speller.  Mr. Juday does not remember the name of this.   His electrocardiogram today shows atrial fib with a well-controlled  ventricular response.   ASSESSMENT/PLAN:  I had about 15 minutes of discussion with Mr. Bloyd  today about the pros and cons of Coumadin.  He is really quite concerned  with thin skin and even has very thin skin on his scrotum necessitating  using a hairdryer rather a towel.  He is worried about bruising and  bleeding.   After going over the risk of having a stroke on aspirin versus Coumadin,  he has decided to ago with Coumadin at least for a trial.  If he is  intolerant to it, we will always go back to aspirin 325 a day.   PLAN:  1. Coumadin 5 mg per day.  2. Introduction of the Coumadin Clinic with education.  3. See me back in about 4-6 weeks.     Thomas C. Daleen Squibb, MD, [facility]  Electronically Signed  TCW/MedQ  DD: 06/03/2008  DT: 06/04/2008  Job #: [id number]

## 2010-08-20 NOTE — Procedures (Signed)
[place]. [facility]  Patient:    Gregory Werner, Gregory Werner                        MRN: [id number] Proc. Date: 08/17/99 Adm. Date:  [id number] Disc. Date: [id number] Attending:  Nelma Rothman Iii                           Procedure Report  PREOPERATIVE DIAGNOSIS:  Adenocarcinoma of the prostate.  OPERATIVE PROCEDURE:  Transperineal implantation of iodine-125 seeds in the prostate and flexible cystourethroscopy.  SURGEON:  Lucrezia Starch. Ovidio Hanger, M.D.  ASSISTANT:  Wynn Banker, M.D.  ANESTHESIA:  General laryngeal airway.  ESTIMATED BLOOD LOSS:  10 cc.  TUBE:  A 16-French Foley.  COMPLICATIONS:  None.  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  He also had high-grade TIN on the left side.  He has considered all options, has been properly informed and after understanding risks, benefits and alternatives, elected to proceed with seed implantation. He has been properly simulated and properly informed.  PROCEDURE IN DETAIL:  Patient was placed in a supine position and after proper general endotracheal anesthesia, was placed in the dorsal lithotomy position and prepped with Betadine and draped in sterile fashion.  A 16-French Foley catheter was inserted and inflated with 10 cc of contrast solution and the transrectal ultrasound probe was placed on the [facility] stabilization device and localized to preplanned coordinates.  A reference plane of 2 cm from the base was utilized.  Both electronic and physical grid were placed.  The perineum had been prepped previously and the area had been draped well again. The fluoroscopy was placed and two holding needles were placed in  unused coordinates.  After proper measurements had been taking, serial implantation was performed.  A total of 120 seeds were implanted with 27 needles at 0.430 mCi per seed of Vicryl-stranded seeds.  The seeds appeared to distribute well throughout the prostate gland, both fluoroscopically and by ultrasound, and we were comfortable with their location.  Following implantation, the transrectal ultrasound probe was removed along with the 14-French red rubber catheter which had been placed and inflated and the wound was dressed sterilely and the patient was placed in the supine position.  The Foley catheter was removed and scanned for seeds, there were none within it, and flexible cystourethroscopy was performed.  There was noted to be moderate trilobar hypertrophy.  No seeds or needle or Vicryl strands were noted to be in the urethra or the bladder.  The bladder had grade 1 trabeculation and efflux of clear urine was noted bilaterally.  The flexible cystourethroscope was visually removed, a new 16-French catheter was inserted and the patient was taken to the recovery room stable. DD:  08/17/99 TD:  08/19/99 Job: [id number]

## 2010-08-20 NOTE — H&P (Signed)
[place]. [facility]  Patient:    Gregory Werner, Gregory Werner                        MRN: [id number] Adm. Date:  [id number] Attending:  Mervin Hack                         History and Physical  PROCEDURE:  Upper endoscopy and esophageal dilation.  INDICATIONS:  This 77 year old gentleman has a benign distal esophageal stricture.  He was dilated in the past and has now recurrence of solid food dysphagia.  He was seen in the office two months ago at which time, he decided to wait a little longer before his dysphagia needs to be treated, but he had another episode of choking several weeks ago, and he decided to go ahead with the dilatation.  He has been on acid-suppressing agent.  ENDOSCOPE:  Fujinon single channel videoscope.  SEDATION:  Versed 10 mg IV, Demerol 50 mg IV.  FINDINGS:  Fujinon single channel videoscope passed under direct vision into the posterior pharynx and into the esophagus.  The patient was monitored by pulse oximetry.  His oxygen saturations were normal.  He was cooperative. Proximal and mid esophageal mucosa was normal.  There was an eccentric lumen in the distal esophagus with a marked esophageal stricture.  It allowed the endoscope to pass through, but it was rather eccentric which probably created the obstructive symptoms.  The stricture was benign appearing.  There were no friability and no active erosions.  There was also a small hiatal hernia which was easily reducable.  Stomach.  The stomach was insufflated with air and showed normal gastric mucosa, antrum, and pyloris.  Retroflexion of endoscope showed a normal fundus and cardia.  Duodenum.  Duodenal bulb and descending duodenum were normal.  Guide wire was placed into the stomach and endoscope was retracted and Savary dilators passed over the guide wire without fluoroscopic guidance using 14, 15, 16, and 17 mm dilator.  There was a small amount of blood on the large dilator.   The patient tolerated the procedure well.  IMPRESSION:  Benign distal esophageal stricture, status post dilatation to [address] [place].  PLAN: 1. Continue antireflux measures. 2. Acid suppressing agents. 3. Avoid aspirin and NSAIDS. 4. The patient will return on p.r.n. basis.DD:  03/17/00 TD:  03/17/00 Job: [id number]

## 2010-08-20 NOTE — Op Note (Signed)
   NAME:  Gregory Werner, Gregory Werner                           ACCOUNT NO.:  [id number]   MEDICAL RECORD NO.:  [id number]                   PATIENT TYPE:  AMB   LOCATION:  ENDO                                 FACILITY:  [facility]   PHYSICIAN:  James L. Malon Kindle., M.D.          DATE OF BIRTH:  07-25-1924   DATE OF PROCEDURE:  12/31/2002  DATE OF DISCHARGE:                                 OPERATIVE REPORT   PROCEDURE:  Colonoscopy.   MEDICATIONS:  1. Fentanyl 87.5 mcg.  2. Versed 9 mg IV.   INDICATION:  Rectal bleeding.   DESCRIPTION OF PROCEDURE:  The procedure had been explained to the patient  and consent obtained.  The patient in the left lateral decubitus position,  the Olympus scope was inserted and advanced.  We advanced to the sigmoid.  It had extensive diverticular disease.  Multiple maneuvers were required and  eventually we were able to pass the sigmoid and were able to advance easily  to the cecum.  The ileocecal valve and appendiceal orifice seen.  The scope  was withdrawn, and the cecum, ascending colon, transverse colon, descending  and sigmoid colon seen well, no polyps, again extensive diverticular disease  in the sigmoid.  The scope was withdrawn down to the rectum.  The rectum was  free of polyps but at the very distal part of the rectum over the prostate  was a teleangiectasia and erythema consistent with radiation proctitis.  It  was quite friable.  There small internal hemorrhoids.  The scope was  withdrawn.  The patient tolerated the procedure well, was maintained on low-  flow oxygen and monitored by pulse oximetry throughout with no obvious  problems.  He was bradycardic with pulses in the 40's-50's throughout the  procedure, felt to be due to his beta blockers.   ASSESSMENT:  1. 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  2. Marked diverticular disease.   PLAN:  We will give diverticulitis information and start on fiber  supplements daily to soften the  stool and will give Rowasa suppositories  q.h.s.  We will see him back in the office in 6-8 weeks.  If he is not  improved, we may need to fulgurate this area with the argon plasma  coagulator.                                               James L. Malon Kindle., M.D.    Waldron Session  D:  12/31/2002  T:  12/31/2002  Job:  [id number]   cc:   [place]. Andrey Campanile, M.D.  [address]  [place]  [place] [address]  Fax: [phone]

## 2010-08-24 ENCOUNTER — Ambulatory Visit (INDEPENDENT_AMBULATORY_CARE_PROVIDER_SITE_OTHER): Payer: Medicare Other | Admitting: *Deleted

## 2010-08-24 DIAGNOSIS — I4891 Unspecified atrial fibrillation: Secondary | ICD-10-CM

## 2010-08-24 NOTE — Patient Instructions (Addendum)
Continue same dose of 1 tablet every day except 1/2 tablet on Sunday, Wednesday and Friday. Take your last dose on 09/17/10. Restart post surgery per the surgeons instructions. Restart your dose with an extra 1/2 tablet for 1 day then resume your regular dose. Please call after discharge and given Home Health information for coumadin checks.

## 2010-09-14 ENCOUNTER — Other Ambulatory Visit: Payer: Self-pay | Admitting: Orthopedic Surgery

## 2010-09-14 ENCOUNTER — Other Ambulatory Visit (HOSPITAL_COMMUNITY): Payer: Self-pay | Admitting: Orthopedic Surgery

## 2010-09-14 ENCOUNTER — Encounter (HOSPITAL_COMMUNITY): Payer: Medicare Other

## 2010-09-14 ENCOUNTER — Ambulatory Visit (HOSPITAL_COMMUNITY)
Admission: RE | Admit: 2010-09-14 | Discharge: 2010-09-14 | Disposition: A | Discharge status: Home / Self Care | Payer: Medicare Other | Source: Ambulatory Visit | Attending: Orthopedic Surgery | Admitting: Orthopedic Surgery

## 2010-09-14 DIAGNOSIS — M1710 Unilateral primary osteoarthritis, unspecified knee: Secondary | ICD-10-CM

## 2010-09-14 DIAGNOSIS — Z01811 Encounter for preprocedural respiratory examination: Secondary | ICD-10-CM

## 2010-09-14 DIAGNOSIS — Z01818 Encounter for other preprocedural examination: Secondary | ICD-10-CM | POA: Insufficient documentation

## 2010-09-14 DIAGNOSIS — Z01812 Encounter for preprocedural laboratory examination: Secondary | ICD-10-CM | POA: Insufficient documentation

## 2010-09-14 DIAGNOSIS — I517 Cardiomegaly: Secondary | ICD-10-CM | POA: Insufficient documentation

## 2010-09-14 LAB — BASIC METABOLIC PANEL
BUN: 21 mg/dL (ref 6–23)
CO2: 28 mEq/L (ref 19–32)
GFR calc non Af Amer: >60 mL/min (ref >60)
Glucose, Bld: 128 mg/dL — ABNORMAL HIGH (ref 70–99)
Potassium: 4.4 mEq/L (ref 3.5–5.1)

## 2010-09-14 LAB — CBC
Hemoglobin: 12.5 g/dL — ABNORMAL LOW (ref 13.0–17.0)
MCH: 30.7 pg (ref 26.0–34.0)
MCHC: 32 g/dL (ref 30.0–36.0)
MCV: 96.1 fL (ref 78.0–100.0)
Platelets: 220 10*3/uL (ref 150–400)

## 2010-09-14 LAB — PROTIME-INR: Prothrombin Time: 22.2 seconds — ABNORMAL HIGH (ref 11.6–15.2)

## 2010-09-15 NOTE — H&P (Signed)
NAME:  Gregory Werner, Gregory Werner NO.:  [id number]  MEDICAL RECORD NO.:  [id number]  LOCATION:                                 FACILITY:  PHYSICIAN:  Marlowe Kays, M.D.  DATE OF BIRTH:  July 25, 1924  DATE OF ADMISSION:  09/23/2010 DATE OF DISCHARGE:                             HISTORY & PHYSICAL   CHIEF COMPLAINT:  "Pain in my left knee."  PRESENT ILLNESS:  An 77 year old white male who was seen by [place] for continued progressive problems concerning his right knee.  He has tried to postpone knee surgery now for a considerable amount of time, but since he has continued to be a very active gentleman who finds that his knee has given him a considerable amount of problems, he has decided to go ahead with total knee replacement arthroplasty.  We have used corticosteroids in the knee, which had only given him short-term relief. He has had seen other orthopedic surgeons with same recommendation for total knee and he has chosen at this time to go ahead with that procedure.  Today, I spent a considerable amount of time with him discussing the perioperative plans.  In all probability, he will have home health after his regular hospitalization.  Preoperatively, he has been cleared for surgery by Dr. Daleen Squibb who has recommended him to be off the Coumadin, which he takes for atrial fibrillation, 5 days prior to surgery.  He has also been cleared by Dr. Thayer Headings for this surgical procedure.  PAST MEDICAL HISTORY:  Over the years, this gentleman has been in relatively good health.  He has developed atrial fibrillation and is on Coumadin for that.  He will stop that on June 16.  MEDICATIONS: 1. Ramipril 2.5 mg daily. 2. Terazosin 2 mg daily. 3. Boniva monthly. 4. Pennsaid which he rubs on his knees and he will stop that prior to     surgery. 5. Vitamin C, D. 6. Citracal. 7. Fish oil (we will stop this prior to surgery).  ALLERGIES:  He denies any medical  allergies.  PAST SURGERIES:  He had prostate cancer with radiation seeds in the prostate in May of 2001.  He has had cataracts extracted with lens implant in October and December of 2008.  FAMILY HISTORY:  Positive for father dying at 51 years of age from heart disease and the mother is 45 years of age.  SOCIAL HISTORY:  The patient is married.  He is retired.  He has about a beer a day.  No intake of tobacco products.  Family will be assisting him postoperatively.  REVIEW OF SYSTEMS:  CNS:  No seizure disorder, paralysis, double vision. The patient is hard of hearing and uses bilateral hearing aids. CARDIOVASCULAR:  No chest pain.  No angina.  GASTROINTESTINAL:  He has diverticulosis but no nausea, vomiting, melena or bloody stool.  He also has some mild reflux.  GENITOURINARY:  No discharge, dysuria, hematuria. MUSCULOSKELETAL:  Primarily in present illness.  Bilateral knee pain.  PHYSICAL EXAMINATION:  GENERAL:  Alert and cooperative friendly 77 year old white male who has difficulty coming to a standing position, moves on to the wall or top.  He also  has difficulty getting started with ambulation. VITAL SIGNS:  Blood pressure 102/72, pulse 60, respirations 12. HEENT:  Normocephalic.  PERRLA.  EOMI intact.  Oropharynx is clear. CHEST:  Clear to auscultation.  No rhonchi, no rales. HEART:  Regular rhythm.  No murmurs are heard. ABDOMEN:  Soft, nontender.  Liver and spleen not felt. GENITALIA:  Not done and not pertinent to present illness. RECTAL: Not done and not pertinent to present illness. EXTREMITIES:  The patient has pain and crepitus with range of motion of both knees, more so on the left than the right.  Incidentally, the x-rays have shown more arthritic changes in the right knee than the left, but he refuses to have the left knee done because that is more symptomatic.     Dooley L. Cherlynn June.   ______________________________ Marlowe Kays,  M.D.    DLU/MEDQ  D:  09/14/2010  T:  09/14/2010  Job:  [id number]  cc:   Thayer Headings, M.D. Fax: [phone]  Jesse Sans. Wall, MD, FACC [address]  [address] [place] [address]  Electronically Signed by Marlowe Kays M.D. on 09/15/2010 12:57:39 PM

## 2010-09-23 ENCOUNTER — Inpatient Hospital Stay (HOSPITAL_COMMUNITY)
Admission: RE | Admit: 2010-09-23 | Discharge: 2010-09-28 | DRG: 470 | Disposition: A | Discharge status: Skilled Nursing Facility | Payer: Medicare Other | Source: Ambulatory Visit | Attending: Orthopedic Surgery | Admitting: Orthopedic Surgery

## 2010-09-23 ENCOUNTER — Inpatient Hospital Stay (HOSPITAL_COMMUNITY): Payer: Medicare Other

## 2010-09-23 DIAGNOSIS — Z01812 Encounter for preprocedural laboratory examination: Secondary | ICD-10-CM

## 2010-09-23 DIAGNOSIS — I4891 Unspecified atrial fibrillation: Secondary | ICD-10-CM | POA: Diagnosis present

## 2010-09-23 DIAGNOSIS — I252 Old myocardial infarction: Secondary | ICD-10-CM

## 2010-09-23 DIAGNOSIS — I10 Essential (primary) hypertension: Secondary | ICD-10-CM | POA: Diagnosis present

## 2010-09-23 DIAGNOSIS — Z7901 Long term (current) use of anticoagulants: Secondary | ICD-10-CM

## 2010-09-23 DIAGNOSIS — Z8546 Personal history of malignant neoplasm of prostate: Secondary | ICD-10-CM

## 2010-09-23 DIAGNOSIS — M1710 Unilateral primary osteoarthritis, unspecified knee: Principal | ICD-10-CM | POA: Diagnosis present

## 2010-09-23 LAB — TYPE AND SCREEN: ABO/RH(D): A POS

## 2010-09-23 LAB — PROTIME-INR: Prothrombin Time: 14.5 seconds (ref 11.6–15.2)

## 2010-09-23 LAB — ABO/RH: ABO/RH(D): A POS

## 2010-09-24 LAB — CBC
MCH: 31.9 pg (ref 26.0–34.0)
MCHC: 33.3 g/dL (ref 30.0–36.0)
MCV: 95.7 fL (ref 78.0–100.0)
Platelets: 189 10*3/uL (ref 150–400)
RBC: 3.45 MIL/uL — ABNORMAL LOW (ref 4.22–5.81)
RDW: 13.1 % (ref 11.5–15.5)

## 2010-09-24 LAB — BASIC METABOLIC PANEL
BUN: 18 mg/dL (ref 6–23)
CO2: 28 mEq/L (ref 19–32)
Calcium: 8.3 mg/dL — ABNORMAL LOW (ref 8.4–10.5)
Chloride: 98 mEq/L (ref 96–112)
Creatinine, Ser: 0.95 mg/dL (ref 0.50–1.35)
GFR calc Af Amer: >60 mL/min (ref >60)
GFR calc non Af Amer: >60 mL/min (ref >60)
Glucose, Bld: 152 mg/dL — ABNORMAL HIGH (ref 70–99)
Potassium: 4.7 mEq/L (ref 3.5–5.1)
Sodium: 132 mEq/L — ABNORMAL LOW (ref 135–145)

## 2010-09-24 LAB — PROTIME-INR: Prothrombin Time: 14.5 seconds (ref 11.6–15.2)

## 2010-09-25 LAB — BASIC METABOLIC PANEL
CO2: 27 mEq/L (ref 19–32)
Calcium: 8.2 mg/dL — ABNORMAL LOW (ref 8.4–10.5)
Chloride: 99 mEq/L (ref 96–112)
Creatinine, Ser: 0.83 mg/dL (ref 0.50–1.35)
Glucose, Bld: 116 mg/dL — ABNORMAL HIGH (ref 70–99)

## 2010-09-25 LAB — CBC
HCT: 28.6 % — ABNORMAL LOW (ref 39.0–52.0)
MCH: 31.9 pg (ref 26.0–34.0)
MCV: 95 fL (ref 78.0–100.0)
Platelets: 171 10*3/uL (ref 150–400)
RBC: 3.01 MIL/uL — ABNORMAL LOW (ref 4.22–5.81)
RDW: 13.3 % (ref 11.5–15.5)

## 2010-09-26 LAB — BASIC METABOLIC PANEL
BUN: 19 mg/dL (ref 6–23)
CO2: 30 mEq/L (ref 19–32)
Calcium: 8.4 mg/dL (ref 8.4–10.5)
Chloride: 101 mEq/L (ref 96–112)
Creatinine, Ser: 0.86 mg/dL (ref 0.50–1.35)

## 2010-09-26 LAB — CBC
HCT: 26.3 % — ABNORMAL LOW (ref 39.0–52.0)
Hemoglobin: 8.8 g/dL — ABNORMAL LOW (ref 13.0–17.0)
MCH: 31.9 pg (ref 26.0–34.0)
MCHC: 33.5 g/dL (ref 30.0–36.0)
RDW: 13.5 % (ref 11.5–15.5)

## 2010-09-26 LAB — PROTIME-INR: INR: 1.54 — ABNORMAL HIGH (ref 0.00–1.49)

## 2010-09-27 LAB — PROTIME-INR: Prothrombin Time: 20.3 seconds — ABNORMAL HIGH (ref 11.6–15.2)

## 2010-09-28 LAB — PROTIME-INR: INR: 1.89 — ABNORMAL HIGH (ref 0.00–1.49)

## 2010-09-29 NOTE — Discharge Summary (Signed)
  Gregory Werner, SALM NO.:  [id number]  MEDICAL RECORD NO.:  [id number]  LOCATION:  1614                         FACILITY:  [facility]  PHYSICIAN:  Marlowe Kays, M.D.  DATE OF BIRTH:  12/27/1924  DATE OF ADMISSION:  09/23/2010 DATE OF DISCHARGE:  09/28/2010                        DISCHARGE SUMMARY - REFERRING   ADMISSION DIAGNOSES:  Osteoarthritis, knees, bilateral.  DISCHARGE DIAGNOSIS:  Osteoarthritis, knees, bilateral.  OPERATION:  Total knee replacement, left, on September 23, 2010.  SUMMARY:  This 77 year old male has had progressive problems with bilateral knee pain and is known to have advanced osteoarthritis bilaterally.  The left one is bothering him more than the right, prompting his visit here today for surgery.  He does have a history of atrial fibrillation and was on chronic Coumadin but was taken off it prior to the surgery and had cardiac clearance for the surgical procedure.  HOSPITAL COURSE:  The above-mentioned surgery was performed without complication.  Postoperatively, the original plan was to have him return home but his progress with rehab here in the hospital was slower than anticipated, prompting decision be made by both family and personnel here at the hospital, to have him transferred to rehab center temporarily.  DISCHARGE MEDICATIONS: 1. Percocet 5/325 one to two q.4h. p.r.n. pain. 2. Robaxin 500 mg one q.6h. p.r.n. for spasm. 3. Coumadin 6 p.m. daily with exact dosage based on his INRs.  These     should be performed on a regular basis. 4. Ramipril 2.5 mg 1 capsule every morning. 5. Terazosin 2 mg p.o. one every evening. 6. Ibandronate 150 mg IM once monthly. 7. Glucosamine/chondroitin sulfate 1 tablet daily. 8. Citracal 1 tablet daily. 9. Vitamin C 500 mg 1 tablet daily. 10.Vitamin D3 1000 units 1 tablet daily. 11.Famotidine 20 mg 1 tablet twice daily. 12.Saline nasal spray, 2 sprays 3 times a day as  needed.  RECOMMENDATIONS:  It was also recommended that he has physical therapy to work on range of motion, gait and particularly stair climbing.  He can be showering if there is no drainage from the incision which was healing nicely at the time of discharge.  If there is drainage, the wound should be covered with sterile dressing.  Plan is to have him return to [facility] roughly 2 weeks from surgery.  Contact number there for discharge is [phone].  CONDITION ON DISCHARGE:  Stable and improved.          ______________________________ Marlowe Kays, M.D.     JA/MEDQ  D:  09/28/2010  T:  09/28/2010  Job:  [id number]  Electronically Signed by Marlowe Kays M.D. on 09/29/2010 02:25:39 PM

## 2010-09-29 NOTE — Op Note (Signed)
NAMEDONYEL, NESTER NO.:  [id number]  MEDICAL RECORD NO.:  [id number]  LOCATION:  1614                         FACILITY:  [facility]  PHYSICIAN:  Marlowe Kays, M.D.  DATE OF BIRTH:  [birth year]  DATE OF PROCEDURE:  09/23/2010 DATE OF DISCHARGE:                              OPERATIVE REPORT   PREOPERATIVE DIAGNOSIS:  Osteoarthritis, left knee.  POSTOPERATIVE DIAGNOSIS:  Osteoarthritis, left knee.  OPERATION:  Total knee replacement left.  SURGEON:  Marlowe Kays, M.D.  ASSISTANTDruscilla Brownie. Underwood, P.A.-C.  ANESTHESIA:  General.  INDICATION FOR PROCEDURE:  He actually has advanced osteoarthritis in both knees, which are well balanced.  Left knee is simply more symptomatic than the right and he elected to have this knee operated on first.  PROCEDURE IN DETAIL:  Prophylactic antibiotics, satisfactory general anesthesia, left hip stabilizer, pneumatic tourniquet and sure foot. The left leg was prepped with DuraPrep from tourniquet to ankle and draped in sterile field.  Time-out performed.  Leg was Esmarched out sterilely and tourniquet inflated to 325 mmHg.  Ioban employed. Vertical midline incision down to the patellar mechanism with knee parapatellar incision to open the joint.  Osteophytes from the patella femur were removed.  The patellar mechanism was freed up and patella everted and the knee flexed.  I removed remnants of the anterior portions of both menisci and portion of the ACL, PCL complex.  I made a 5.16 drill hole in the distal femur followed by the canal finder and the axis aligner set for 5 degrees valgus cut for the left knee.  Because he had a moderate flexion contracture, I elected to take 12 mm off the distal femur rather than the usual 10.  After performing this, we then used the sizing jig and I found that a size 11 femur seemed to be the appropriate size.  I then placed the distal femoral cutting jig to make anterior and  posterior cuts, posterior and anterior chamfer rings. Returning to the tibia, I made my leveling cut and sized the tibia to either 9 or 11.  Using 11 baseplate, I made my initial intramedullary drill hole followed by step-cut drill, canal finder and intramedullary rod for 90-degree cut taking initially 4 mm off the depressed lateral tibial plateau.  After making this cut, I then placed laminar spreader and we removed remnants of bone and the ligament from behind the femoral condyles.  I then placed the jig for creating the notchplasty and the patellar trough both of which were performed.  I then went through a trial reduction with the femoral and tibial components in place and found that the knee was too tight for a 10-mm spacer.  Accordingly, I ended up taking 4 mm additional bone off the proximal tibia and the 10 mm spacer then seemed to be ideal.  Also, the 9-mm base plate seemed to be a better size than the 11.  While the knee was in extension, I used the external rod splitting the bimalleolar distance to approximate the best position for the tibial tray and scribe lines were placed on the anterior tibia.  While the knee was in extension, I then measured  the patellar at 28.  We used the 10-mm recess cutting jig for the 20 patella followed by the guide for creating three fixation holes and then placed the trial patella trimming bone from around the patella.  Returning to the tibia using the previous scribe lines on the anterior tibia, I used the tripod apparatus for reaming up to a nine cemented.  We then went through another trial reduction and found the 10 mm spacer to be ideal. The knee was then pressure irrigated while the components were opened and the methacrylate mixed.  I then glued in the individual components, working first with the tibia with methacrylate placed on the underneath surface of the tray and the stem and excess methacrylate was then removed from around the tibia  component then including the femur in the same way and held the knee in extension with the 10 mm spacer while we glued in the patella, which I held with a patellar holding clamp.  The methacrylate hardened.  We checked for removal of small bits of methacrylate throughout the knee and once again went through a trial reduction with a 10-mm spacer, which seemed to be ideal.  Consequently, after irrigating the wound well again, we placed the final 10-mm posterior stabilized insert, checked for stability.  No lateral release was required.  Hemovac was placed.  The wound was then closed in layers with interrupted #1 Vicryl in two layers in the quadriceps tendon and distally in the synovium capsule.  Subcutaneous tissue were closed with 2-0 Vicryl and staples in the skin.  Betadine Adaptic dry sterile dressing were applied.  Tourniquet was released.  He tolerated the procedure well and was taken to the recovery room in satisfactory condition with no known complications and no blood loss.          ______________________________ Marlowe Kays, M.D.     JA/MEDQ  D:  09/23/2010  T:  09/23/2010  Job:  [id number]  Electronically Signed by Marlowe Kays M.D. on 09/29/2010 02:23:37 PM

## 2010-10-08 ENCOUNTER — Ambulatory Visit (INDEPENDENT_AMBULATORY_CARE_PROVIDER_SITE_OTHER): Payer: Self-pay | Admitting: Cardiovascular Disease

## 2010-10-08 DIAGNOSIS — R0989 Other specified symptoms and signs involving the circulatory and respiratory systems: Secondary | ICD-10-CM

## 2010-10-08 LAB — PROTIME-INR: INR: 5.3 — AB (ref <=1.1)

## 2010-10-11 ENCOUNTER — Ambulatory Visit (INDEPENDENT_AMBULATORY_CARE_PROVIDER_SITE_OTHER): Payer: Self-pay | Admitting: Internal Medicine

## 2010-10-11 DIAGNOSIS — R0989 Other specified symptoms and signs involving the circulatory and respiratory systems: Secondary | ICD-10-CM

## 2010-10-11 LAB — POCT INR: INR: 2

## 2010-10-14 ENCOUNTER — Encounter: Payer: Medicare Other | Admitting: *Deleted

## 2010-10-15 ENCOUNTER — Ambulatory Visit (INDEPENDENT_AMBULATORY_CARE_PROVIDER_SITE_OTHER): Payer: Self-pay | Admitting: Cardiology

## 2010-10-15 DIAGNOSIS — R0989 Other specified symptoms and signs involving the circulatory and respiratory systems: Secondary | ICD-10-CM

## 2010-10-15 LAB — POCT INR: INR: 2.5

## 2010-10-22 ENCOUNTER — Ambulatory Visit (INDEPENDENT_AMBULATORY_CARE_PROVIDER_SITE_OTHER): Payer: Self-pay | Admitting: Internal Medicine

## 2010-10-22 DIAGNOSIS — R0989 Other specified symptoms and signs involving the circulatory and respiratory systems: Secondary | ICD-10-CM

## 2010-10-22 LAB — POCT INR: INR: 3.9

## 2010-10-28 ENCOUNTER — Ambulatory Visit (INDEPENDENT_AMBULATORY_CARE_PROVIDER_SITE_OTHER): Payer: Medicare Other | Admitting: *Deleted

## 2010-10-28 DIAGNOSIS — I4891 Unspecified atrial fibrillation: Secondary | ICD-10-CM

## 2010-10-28 LAB — POCT INR: INR: 2

## 2010-11-09 ENCOUNTER — Ambulatory Visit (INDEPENDENT_AMBULATORY_CARE_PROVIDER_SITE_OTHER): Payer: Medicare Other | Admitting: *Deleted

## 2010-11-09 DIAGNOSIS — I4891 Unspecified atrial fibrillation: Secondary | ICD-10-CM

## 2010-11-30 ENCOUNTER — Ambulatory Visit (INDEPENDENT_AMBULATORY_CARE_PROVIDER_SITE_OTHER): Payer: Medicare Other | Admitting: *Deleted

## 2010-11-30 DIAGNOSIS — I4891 Unspecified atrial fibrillation: Secondary | ICD-10-CM

## 2010-12-21 ENCOUNTER — Ambulatory Visit (INDEPENDENT_AMBULATORY_CARE_PROVIDER_SITE_OTHER): Payer: Medicare Other | Admitting: *Deleted

## 2010-12-21 DIAGNOSIS — I4891 Unspecified atrial fibrillation: Secondary | ICD-10-CM

## 2011-01-11 ENCOUNTER — Ambulatory Visit (INDEPENDENT_AMBULATORY_CARE_PROVIDER_SITE_OTHER): Payer: Medicare Other | Admitting: *Deleted

## 2011-01-11 DIAGNOSIS — I4891 Unspecified atrial fibrillation: Secondary | ICD-10-CM

## 2011-02-08 ENCOUNTER — Ambulatory Visit (INDEPENDENT_AMBULATORY_CARE_PROVIDER_SITE_OTHER): Payer: Medicare Other | Admitting: *Deleted

## 2011-02-08 DIAGNOSIS — Z7901 Long term (current) use of anticoagulants: Secondary | ICD-10-CM

## 2011-02-08 DIAGNOSIS — I4891 Unspecified atrial fibrillation: Secondary | ICD-10-CM

## 2011-03-08 ENCOUNTER — Ambulatory Visit (INDEPENDENT_AMBULATORY_CARE_PROVIDER_SITE_OTHER): Payer: Medicare Other | Admitting: *Deleted

## 2011-03-08 DIAGNOSIS — I4891 Unspecified atrial fibrillation: Secondary | ICD-10-CM

## 2011-03-08 DIAGNOSIS — Z7901 Long term (current) use of anticoagulants: Secondary | ICD-10-CM

## 2011-03-08 LAB — POCT INR: INR: 2

## 2011-03-19 ENCOUNTER — Other Ambulatory Visit: Payer: Self-pay | Admitting: Cardiology

## 2011-04-04 ENCOUNTER — Ambulatory Visit (INDEPENDENT_AMBULATORY_CARE_PROVIDER_SITE_OTHER): Payer: Medicare Other | Admitting: *Deleted

## 2011-04-04 DIAGNOSIS — Z7901 Long term (current) use of anticoagulants: Secondary | ICD-10-CM

## 2011-04-04 DIAGNOSIS — I4891 Unspecified atrial fibrillation: Secondary | ICD-10-CM

## 2011-04-04 LAB — POCT INR: INR: 2.2

## 2011-05-17 ENCOUNTER — Ambulatory Visit (INDEPENDENT_AMBULATORY_CARE_PROVIDER_SITE_OTHER): Payer: Medicare Other | Admitting: *Deleted

## 2011-05-17 DIAGNOSIS — I4891 Unspecified atrial fibrillation: Secondary | ICD-10-CM

## 2011-05-17 DIAGNOSIS — Z7901 Long term (current) use of anticoagulants: Secondary | ICD-10-CM

## 2011-05-17 LAB — POCT INR: INR: 1.6

## 2011-05-25 ENCOUNTER — Other Ambulatory Visit: Payer: Self-pay | Admitting: *Deleted

## 2011-05-25 MED ORDER — RAMIPRIL 2.5 MG PO TABS: 2.5000 mg | ORAL_TABLET | Freq: Every day | ORAL | Status: DC

## 2011-05-31 ENCOUNTER — Ambulatory Visit (INDEPENDENT_AMBULATORY_CARE_PROVIDER_SITE_OTHER): Payer: Medicare Other | Admitting: *Deleted

## 2011-05-31 DIAGNOSIS — Z7901 Long term (current) use of anticoagulants: Secondary | ICD-10-CM

## 2011-05-31 DIAGNOSIS — I4891 Unspecified atrial fibrillation: Secondary | ICD-10-CM

## 2011-05-31 LAB — POCT INR: INR: 1.6

## 2011-06-14 ENCOUNTER — Ambulatory Visit (INDEPENDENT_AMBULATORY_CARE_PROVIDER_SITE_OTHER): Payer: Medicare Other

## 2011-06-14 DIAGNOSIS — I4891 Unspecified atrial fibrillation: Secondary | ICD-10-CM

## 2011-06-14 DIAGNOSIS — Z7901 Long term (current) use of anticoagulants: Secondary | ICD-10-CM

## 2011-06-14 LAB — POCT INR: INR: 2.1

## 2011-06-27 ENCOUNTER — Telehealth: Payer: Self-pay | Admitting: Cardiology

## 2011-06-27 NOTE — Telephone Encounter (Signed)
Please advise 

## 2011-06-27 NOTE — Telephone Encounter (Signed)
Pt need to come off coumadin for 5days to get injection in his back at Dr. Ethelene Hal office. The office# [phone]

## 2011-06-29 NOTE — Telephone Encounter (Signed)
Ok to hold coumadin for 5 days per Dr. Daleen Squibb.

## 2011-07-06 ENCOUNTER — Ambulatory Visit (INDEPENDENT_AMBULATORY_CARE_PROVIDER_SITE_OTHER): Payer: Medicare Other | Admitting: Cardiology

## 2011-07-06 ENCOUNTER — Encounter: Payer: Self-pay | Admitting: *Deleted

## 2011-07-06 ENCOUNTER — Ambulatory Visit (INDEPENDENT_AMBULATORY_CARE_PROVIDER_SITE_OTHER): Payer: Medicare Other | Admitting: Pharmacist

## 2011-07-06 ENCOUNTER — Encounter: Payer: Self-pay | Admitting: Cardiology

## 2011-07-06 VITALS — BP 124/68 | HR 60 | Ht 70.0 in | Wt 151.0 lb

## 2011-07-06 DIAGNOSIS — I4891 Unspecified atrial fibrillation: Secondary | ICD-10-CM

## 2011-07-06 DIAGNOSIS — I10 Essential (primary) hypertension: Secondary | ICD-10-CM

## 2011-07-06 DIAGNOSIS — Z7901 Long term (current) use of anticoagulants: Secondary | ICD-10-CM

## 2011-07-06 DIAGNOSIS — E785 Hyperlipidemia, unspecified: Secondary | ICD-10-CM

## 2011-07-06 LAB — POCT INR: INR: 2.5

## 2011-07-06 NOTE — Progress Notes (Signed)
HPI Gregory Werner comes in today for the evaluation of his chronic A. fib. He remains totally asymptomatic treated with good rate control and anticoagulation.  His blood pressures under good control as well.  His biggest concern is weakness in his legs. He is to have a paraspinal injection with Dr. Ethelene Hal next week. We're stopping his Coumadin today. His INR is 2.5 today.  He denies any chest discomfort, presyncope or syncope, or palpitations.  Past Medical History  Diagnosis Date  . Atrial fibrillation   . Hypertension     benign  . Hyperlipidemia   . GERD (gastroesophageal reflux disease)   . Lung infiltrate 05/2010    left lower lobe infiltrate, questionable pneumonia  . Prostate cancer 2001    treated with radiation  . Dysphagia 2001    history s/p esophageal dilatation  . Diverticulosis     history  . Urinary frequency     history    Current Outpatient Prescriptions  Medication Sig Dispense Refill  . Ascorbic Acid (VITAMIN C) 1000 MG tablet Take 1,000 mg by mouth daily.        . Cyanocobalamin (VITAMIN B 12 PO) Take 1 tablet by mouth daily.        . famotidine (PEPCID) 20 MG tablet Take 20 mg by mouth. 2 tablets once daily       . fish oil-omega-3 fatty acids 1000 MG capsule Take 1 g by mouth daily.      . Glucosamine-Chondroit-Vit C-Mn (GLUCOSAMINE 1500 COMPLEX PO) Take 1 tablet by mouth daily.        Marland Kitchen ibandronate (BONIVA) 150 MG tablet Take 150 mg by mouth every 30 (thirty) days. Take in the morning with a full glass of water, on an empty stomach, and do not take anything else by mouth or lie down for the next 30 min.       . Multiple Minerals-Vitamins (CITRACAL PLUS PO) Take 1 tablet by mouth daily.        . ramipril (ALTACE) 2.5 MG tablet Take 1 tablet (2.5 mg total) by mouth daily.  90 tablet  2  . terazosin (HYTRIN) 2 MG capsule Take 2 mg by mouth at bedtime.        Marland Kitchen warfarin (COUMADIN) 5 MG tablet USE AS DIRECTED BY ANTICOAGULATION CLINIC  30 tablet  3    No Known  Allergies  Family History  Problem Relation Age of Onset  . Heart attack Mother   . Heart attack Father     History   Social History  . Marital Status: Married    Spouse Name: N/A    Number of Children: N/A  . Years of Education: N/A   Occupational History  . retired     Office manager   Social History Main Topics  . Smoking status: Former Games developer  . Smokeless tobacco: Never Used   Comment: smoked only 7 years and not everyday  . Alcohol Use: Yes  . Drug Use: No  . Sexually Active: Not on file   Other Topics Concern  . Not on file   Social History Narrative  . No narrative on file    ROS ALL NEGATIVE EXCEPT THOSE NOTED IN HPI  PE  General Appearance: well developed, well nourished in no acute distress HEENT: symmetrical face, PERRLA, good dentition  Neck: no JVD, thyromegaly, or adenopathy, trachea midline Chest: symmetric without deformity Cardiac: PMI non-displaced, irregular rate and rhythm normal S1, S2, no gallop or murmur Lung: clear to ausculation  and percussion Vascular: all pulses full without bruits  Abdominal: nondistended, nontender, good bowel sounds, no HSM, no bruits Extremities: no cyanosis, clubbing or edema, no sign of DVT, no varicosities , dependent rubor Skin: normal color, no rashes Neuro: alert and oriented x 3, non-focal Pysch: normal affect  EKG Chronic A. fib, RSR prime in V1 and V 2, no acute changes BMET    Component Value Date/Time   NA 134* 09/26/2010 0432   K 4.1 09/26/2010 0432   CL 101 09/26/2010 0432   CO2 30 09/26/2010 0432   GLUCOSE 97 09/26/2010 0432   BUN 19 09/26/2010 0432   CREATININE 0.86 09/26/2010 0432   CALCIUM 8.4 09/26/2010 0432   GFRNONAA >60 09/26/2010 0432   GFRAA >60 09/26/2010 0432    Lipid Panel     Component Value Date/Time   CHOL  Value: 175        ATP III CLASSIFICATION:  <200     mg/dL   Desirable  956-213  mg/dL   Borderline High  >=086    mg/dL   High        08/09/8467 0140   TRIG 34 05/11/2008 0140    HDL 64 05/11/2008 0140   CHOLHDL 2.7 05/11/2008 0140   VLDL 7 05/11/2008 0140   LDLCALC  Value: 104        Total Cholesterol/HDL:CHD Risk Coronary Heart Disease Risk Table                     Men   Women  1/2 Average Risk   3.4   3.3  Average Risk       5.0   4.4  2 X Average Risk   9.6   7.1  3 X Average Risk  23.4   11.0        Use the calculated Patient Ratio above and the CHD Risk Table to determine the patient's CHD Risk.        ATP III CLASSIFICATION (LDL):  <100     mg/dL   Optimal  629-528  mg/dL   Near or Above                    Optimal  130-159  mg/dL   Borderline  413-244  mg/dL   High  >010     mg/dL   Very High* 05/11/2534 6440    CBC    Component Value Date/Time   WBC 8.3 09/26/2010 0432   RBC 2.76* 09/26/2010 0432   HGB 8.8* 09/26/2010 0432   HCT 26.3* 09/26/2010 0432   PLT 170 09/26/2010 0432   MCV 95.3 09/26/2010 0432   MCH 31.9 09/26/2010 0432   MCHC 33.5 09/26/2010 0432   RDW 13.5 09/26/2010 0432   LYMPHSABS 1.2 05/10/2008 1816   MONOABS 0.6 05/10/2008 1816   EOSABS 0.1 05/10/2008 1816   BASOSABS 0.0 05/10/2008 1816

## 2011-07-06 NOTE — Patient Instructions (Signed)
Your physician recommends that you continue on your current medications as directed. Please refer to the Current Medication list given to you today.   Your physician wants you to follow-up in: 1 year with Dr. Wall. You will receive a reminder letter in the mail two months in advance. If you don't receive a letter, please call our office to schedule the follow-up appointment.  

## 2011-07-06 NOTE — Assessment & Plan Note (Signed)
Stable with good rate control and on anticoagulation. He is totally asymptomatic. Stop his Coumadin today or 5 days for spinal injection.

## 2011-07-06 NOTE — Assessment & Plan Note (Signed)
Good control. No change in antihypertensive regimen

## 2011-07-19 ENCOUNTER — Ambulatory Visit (INDEPENDENT_AMBULATORY_CARE_PROVIDER_SITE_OTHER): Payer: Medicare Other

## 2011-07-19 DIAGNOSIS — I4891 Unspecified atrial fibrillation: Secondary | ICD-10-CM

## 2011-07-19 DIAGNOSIS — Z7901 Long term (current) use of anticoagulants: Secondary | ICD-10-CM

## 2011-08-02 ENCOUNTER — Ambulatory Visit (INDEPENDENT_AMBULATORY_CARE_PROVIDER_SITE_OTHER): Payer: Medicare Other

## 2011-08-02 DIAGNOSIS — Z7901 Long term (current) use of anticoagulants: Secondary | ICD-10-CM

## 2011-08-02 DIAGNOSIS — I4891 Unspecified atrial fibrillation: Secondary | ICD-10-CM

## 2011-08-30 ENCOUNTER — Other Ambulatory Visit: Payer: Self-pay | Admitting: Cardiology

## 2011-08-30 ENCOUNTER — Ambulatory Visit (INDEPENDENT_AMBULATORY_CARE_PROVIDER_SITE_OTHER): Payer: Medicare Other | Admitting: Pharmacist

## 2011-08-30 DIAGNOSIS — I4891 Unspecified atrial fibrillation: Secondary | ICD-10-CM

## 2011-08-30 DIAGNOSIS — Z7901 Long term (current) use of anticoagulants: Secondary | ICD-10-CM

## 2011-08-30 LAB — POCT INR: INR: 3.1

## 2011-09-27 ENCOUNTER — Ambulatory Visit (INDEPENDENT_AMBULATORY_CARE_PROVIDER_SITE_OTHER): Payer: Medicare Other | Admitting: Pharmacist

## 2011-09-27 DIAGNOSIS — Z7901 Long term (current) use of anticoagulants: Secondary | ICD-10-CM

## 2011-09-27 DIAGNOSIS — I4891 Unspecified atrial fibrillation: Secondary | ICD-10-CM

## 2011-10-25 ENCOUNTER — Ambulatory Visit (INDEPENDENT_AMBULATORY_CARE_PROVIDER_SITE_OTHER): Payer: Medicare Other

## 2011-10-25 DIAGNOSIS — I4891 Unspecified atrial fibrillation: Secondary | ICD-10-CM

## 2011-10-25 DIAGNOSIS — Z7901 Long term (current) use of anticoagulants: Secondary | ICD-10-CM

## 2011-12-06 ENCOUNTER — Ambulatory Visit (INDEPENDENT_AMBULATORY_CARE_PROVIDER_SITE_OTHER): Payer: Medicare Other | Admitting: Pharmacist

## 2011-12-06 DIAGNOSIS — Z7901 Long term (current) use of anticoagulants: Secondary | ICD-10-CM

## 2011-12-06 DIAGNOSIS — I4891 Unspecified atrial fibrillation: Secondary | ICD-10-CM

## 2011-12-06 LAB — POCT INR: INR: 2.2

## 2012-01-03 ENCOUNTER — Ambulatory Visit (INDEPENDENT_AMBULATORY_CARE_PROVIDER_SITE_OTHER): Payer: Medicare Other

## 2012-01-03 DIAGNOSIS — Z7901 Long term (current) use of anticoagulants: Secondary | ICD-10-CM

## 2012-01-03 DIAGNOSIS — I4891 Unspecified atrial fibrillation: Secondary | ICD-10-CM

## 2012-01-03 LAB — POCT INR: INR: 2.6

## 2012-01-31 ENCOUNTER — Ambulatory Visit (INDEPENDENT_AMBULATORY_CARE_PROVIDER_SITE_OTHER): Payer: Medicare Other | Admitting: *Deleted

## 2012-01-31 DIAGNOSIS — I4891 Unspecified atrial fibrillation: Secondary | ICD-10-CM

## 2012-01-31 DIAGNOSIS — Z7901 Long term (current) use of anticoagulants: Secondary | ICD-10-CM

## 2012-01-31 LAB — POCT INR: INR: 2.8

## 2012-02-07 ENCOUNTER — Other Ambulatory Visit: Payer: Self-pay | Admitting: Cardiology

## 2012-02-16 ENCOUNTER — Other Ambulatory Visit: Payer: Self-pay | Admitting: *Deleted

## 2012-02-16 MED ORDER — RAMIPRIL 2.5 MG PO TABS: 2.5000 mg | ORAL_TABLET | Freq: Every day | ORAL | Status: DC

## 2012-03-13 ENCOUNTER — Ambulatory Visit (INDEPENDENT_AMBULATORY_CARE_PROVIDER_SITE_OTHER): Payer: Medicare Other

## 2012-03-13 DIAGNOSIS — Z7901 Long term (current) use of anticoagulants: Secondary | ICD-10-CM

## 2012-03-13 DIAGNOSIS — I4891 Unspecified atrial fibrillation: Secondary | ICD-10-CM

## 2012-03-13 LAB — POCT INR: INR: 2.9

## 2012-04-03 ENCOUNTER — Ambulatory Visit (INDEPENDENT_AMBULATORY_CARE_PROVIDER_SITE_OTHER): Payer: Medicare Other | Admitting: *Deleted

## 2012-04-03 DIAGNOSIS — Z7901 Long term (current) use of anticoagulants: Secondary | ICD-10-CM

## 2012-04-03 DIAGNOSIS — I4891 Unspecified atrial fibrillation: Secondary | ICD-10-CM

## 2012-04-24 ENCOUNTER — Encounter (HOSPITAL_COMMUNITY): Payer: Self-pay | Admitting: *Deleted

## 2012-04-24 ENCOUNTER — Emergency Department (HOSPITAL_COMMUNITY): Payer: Medicare Other

## 2012-04-24 ENCOUNTER — Emergency Department (HOSPITAL_COMMUNITY)
Admission: EM | Admit: 2012-04-24 | Discharge: 2012-04-24 | Disposition: A | Discharge status: Home / Self Care | Payer: Medicare Other | Attending: Emergency Medicine | Admitting: Emergency Medicine

## 2012-04-24 DIAGNOSIS — Y929 Unspecified place or not applicable: Secondary | ICD-10-CM | POA: Insufficient documentation

## 2012-04-24 DIAGNOSIS — S43006A Unspecified dislocation of unspecified shoulder joint, initial encounter: Secondary | ICD-10-CM | POA: Insufficient documentation

## 2012-04-24 DIAGNOSIS — Z7901 Long term (current) use of anticoagulants: Secondary | ICD-10-CM | POA: Insufficient documentation

## 2012-04-24 DIAGNOSIS — S0990XA Unspecified injury of head, initial encounter: Secondary | ICD-10-CM | POA: Insufficient documentation

## 2012-04-24 DIAGNOSIS — Y9301 Activity, walking, marching and hiking: Secondary | ICD-10-CM | POA: Insufficient documentation

## 2012-04-24 DIAGNOSIS — E785 Hyperlipidemia, unspecified: Secondary | ICD-10-CM | POA: Insufficient documentation

## 2012-04-24 DIAGNOSIS — S43005A Unspecified dislocation of left shoulder joint, initial encounter: Secondary | ICD-10-CM

## 2012-04-24 DIAGNOSIS — Z8546 Personal history of malignant neoplasm of prostate: Secondary | ICD-10-CM | POA: Insufficient documentation

## 2012-04-24 DIAGNOSIS — Z8679 Personal history of other diseases of the circulatory system: Secondary | ICD-10-CM | POA: Insufficient documentation

## 2012-04-24 DIAGNOSIS — IMO0002 Reserved for concepts with insufficient information to code with codable children: Secondary | ICD-10-CM | POA: Insufficient documentation

## 2012-04-24 DIAGNOSIS — Z8719 Personal history of other diseases of the digestive system: Secondary | ICD-10-CM | POA: Insufficient documentation

## 2012-04-24 DIAGNOSIS — K219 Gastro-esophageal reflux disease without esophagitis: Secondary | ICD-10-CM | POA: Insufficient documentation

## 2012-04-24 DIAGNOSIS — Z87891 Personal history of nicotine dependence: Secondary | ICD-10-CM | POA: Insufficient documentation

## 2012-04-24 DIAGNOSIS — I10 Essential (primary) hypertension: Secondary | ICD-10-CM | POA: Insufficient documentation

## 2012-04-24 DIAGNOSIS — W19XXXA Unspecified fall, initial encounter: Secondary | ICD-10-CM

## 2012-04-24 DIAGNOSIS — Z79899 Other long term (current) drug therapy: Secondary | ICD-10-CM | POA: Insufficient documentation

## 2012-04-24 DIAGNOSIS — Z8709 Personal history of other diseases of the respiratory system: Secondary | ICD-10-CM | POA: Insufficient documentation

## 2012-04-24 DIAGNOSIS — W108XXA Fall (on) (from) other stairs and steps, initial encounter: Secondary | ICD-10-CM | POA: Insufficient documentation

## 2012-04-24 MED ORDER — ONDANSETRON HCL 4 MG/2ML IJ SOLN
4.0000 mg | Freq: Once | INTRAMUSCULAR | Status: AC
  Administered 2012-04-24: 4 mg via INTRAVENOUS
  Filled 2012-04-24: qty 2

## 2012-04-24 MED ORDER — PROPOFOL 10 MG/ML IV BOLUS
0.5000 mg/kg | Freq: Once | INTRAVENOUS | Status: AC
  Administered 2012-04-24: 34 mg via INTRAVENOUS
  Filled 2012-04-24: qty 20

## 2012-04-24 MED ORDER — SODIUM CHLORIDE 0.9 % IV BOLUS (SEPSIS)
500.0000 mL | Freq: Once | INTRAVENOUS | Status: AC
  Administered 2012-04-24: 500 mL via INTRAVENOUS

## 2012-04-24 MED ORDER — FENTANYL CITRATE 0.05 MG/ML IJ SOLN
50.0000 ug | Freq: Once | INTRAMUSCULAR | Status: AC
  Administered 2012-04-24: 50 ug via INTRAVENOUS

## 2012-04-24 MED ORDER — FENTANYL CITRATE 0.05 MG/ML IJ SOLN
50.0000 ug | Freq: Once | INTRAMUSCULAR | Status: AC
  Administered 2012-04-24: 50 ug via INTRAVENOUS
  Filled 2012-04-24: qty 2

## 2012-04-24 MED ORDER — FENTANYL CITRATE 0.05 MG/ML IJ SOLN
INTRAMUSCULAR | Status: AC
  Administered 2012-04-24: 50 ug via INTRAVENOUS
  Filled 2012-04-24: qty 2

## 2012-04-24 NOTE — ED Notes (Signed)
Family updated as to patient's status.

## 2012-04-24 NOTE — ED Notes (Signed)
Discharge instructions reviewed. Pt verbalized understanding.  

## 2012-04-24 NOTE — ED Notes (Signed)
Patient tolerated procedure well.  Shoulder reduction completed with one attempt.  Awaiting x ray to confirm placement.

## 2012-04-24 NOTE — ED Notes (Signed)
Patient has returned from xray,  Reports his pain is worse.  Will inform md,  Otherwise, remains alert and oriented.

## 2012-04-24 NOTE — ED Provider Notes (Addendum)
History     CSN: [id number]  Arrival date & time 04/24/12  1117   First MD Initiated Contact with Patient 04/24/12 1119      Chief Complaint  Patient presents with  . Fall  . Shoulder Pain    (Consider location/radiation/quality/duration/timing/severity/associated sxs/prior treatment) HPI... level V caveat for urgent need for intervention.  Accidental fall down 2 steps today injuring left shoulder. Also abrasion to left forehead. No neck trauma. No loss of consciousness or neurological deficits. Movement makes pain worse  Past Medical History  Diagnosis Date  . Atrial fibrillation   . Hypertension     benign  . Hyperlipidemia   . GERD (gastroesophageal reflux disease)   . Lung infiltrate 05/2010    left lower lobe infiltrate, questionable pneumonia  . Prostate cancer 2001    treated with radiation  . Dysphagia 2001    history s/p esophageal dilatation  . Diverticulosis     history  . Urinary frequency     history  . Prostate cancer     Past Surgical History  Procedure Date  . Esophageal dilation 2001  . Tonsillectomy   . Cataract extraction 2008  . Replacement total knee 2012    left    Family History  Problem Relation Age of Onset  . Heart attack Mother   . Heart attack Father     History  Substance Use Topics  . Smoking status: Former Games developer  . Smokeless tobacco: Never Used     Comment: smoked only 7 years and not everyday  . Alcohol Use: Yes      Review of Systems  Unable to perform ROS   Allergies  Review of patient's allergies indicates no known allergies.  Home Medications   Current Outpatient Rx  Name  Route  Sig  Dispense  Refill  . ACETAMINOPHEN 500 MG PO TABS   Oral   Take 1,000 mg by mouth every 6 (six) hours as needed. For hip pain         . VITAMIN C 1000 MG PO TABS   Oral   Take 1,000 mg by mouth daily.           Marland Kitchen CITRACAL + D PO   Oral   Take 1 tablet by mouth daily.         Marland Kitchen VITAMIN B 12 PO   Oral   Take 1  tablet by mouth daily.           Marland Kitchen FAMOTIDINE 20 MG PO TABS   Oral   Take 20 mg by mouth 2 (two) times daily.          . OMEGA-3 FATTY ACIDS 1000 MG PO CAPS   Oral   Take 1 g by mouth daily.         Marland Kitchen GLUCOSAMINE CHONDR COMPLEX PO   Oral   Take 1 tablet by mouth daily.         . IBANDRONATE SODIUM 150 MG PO TABS   Oral   Take 150 mg by mouth every 30 (thirty) days. Take in the morning with a full glass of water, on an empty stomach, and do not take anything else by mouth or lie down for the next 30 min.          Marland Kitchen RAMIPRIL 2.5 MG PO TABS   Oral   Take 1 tablet (2.5 mg total) by mouth daily.   90 tablet   1   . TERAZOSIN HCL 2 MG  PO CAPS   Oral   Take 2 mg by mouth at bedtime.           . WARFARIN SODIUM 5 MG PO TABS   Oral   Take 2.5-5 mg by mouth daily. Take 2.5mg  on Mon, Wed, and Fri and take 5mg  on the rest of the days           BP 152/75  Pulse 81  Temp 97.8 F (36.6 C) (Oral)  Resp 18  Ht 5\' 9"  (1.753 m)  Wt 150 lb (68.04 kg)  BMI 22.15 kg/m2  SpO2 100%  Physical Exam  Nursing note and vitals reviewed. Constitutional: He is oriented to person, place, and time. He appears well-developed and well-nourished.  HENT:  Head: Normocephalic.       4 x 4 CM abrasion of left fore head  Eyes: Conjunctivae normal and EOM are normal. Pupils are equal, round, and reactive to light.  Neck: Normal range of motion. Neck supple.  Cardiovascular: Normal rate, regular rhythm and normal heart sounds.   Pulmonary/Chest: Effort normal and breath sounds normal.  Abdominal: Soft. Bowel sounds are normal.  Musculoskeletal:       Left shoulder dislocated inferiorly. Neurovascular intact distally  Neurological: He is alert and oriented to person, place, and time.  Skin: Skin is warm and dry.  Psychiatric: He has a normal mood and affect.    ED Course  Procedural sedation Date/Time: 04/24/2012 2:30 PM Performed by: Donnetta Hutching Authorized by: Donnetta Hutching Risks  and benefits: risks, benefits and alternatives were discussed Consent given by: patient Patient understanding: patient states understanding of the procedure being performed Patient consent: the patient's understanding of the procedure matches consent given Procedure consent: procedure consent matches procedure scheduled Relevant documents: relevant documents present and verified Imaging studies: imaging studies available Required items: required blood products, implants, devices, and special equipment available Patient identity confirmed: verbally with patient and arm band Time out: Immediately prior to procedure a "time out" was called to verify the correct patient, procedure, equipment, support staff and site/side marked as required. Local anesthesia used: no Patient sedated: yes Sedatives: propofol Analgesia: fentanyl Sedation start date/time: 04/24/2012 2:30 PM Sedation end date/time: 04/24/2012 2:46 PM Vitals: Vital signs were monitored during sedation. Patient tolerance: Patient tolerated the procedure well with no immediate complications.  Reduction of dislocation Date/Time: 04/24/2012 2:20 PM Performed by: Donnetta Hutching Authorized by: Donnetta Hutching Consent: Verbal consent obtained. Written consent obtained. Risks and benefits: risks, benefits and alternatives were discussed Consent given by: patient Patient understanding: patient states understanding of the procedure being performed Patient consent: the patient's understanding of the procedure matches consent given Procedure consent: procedure consent matches procedure scheduled Relevant documents: relevant documents present and verified Test results: test results available and properly labeled Imaging studies: imaging studies available Required items: required blood products, implants, devices, and special equipment available Patient identity confirmed: verbally with patient and arm band Time out: Immediately prior to procedure a  "time out" was called to verify the correct patient, procedure, equipment, support staff and site/side marked as required. Comments: Inferior dislocation was reduced with traction and countertraction.  Neurovascular intact post procedure.  Post reduction x-ray obtained.  No apparent complications   (including critical care time)  Labs Reviewed - No data to display Ct Head Wo Contrast  04/24/2012  *RADIOLOGY REPORT*  Clinical Data: Fall.  CT HEAD WITHOUT CONTRAST  Technique:  Contiguous axial images were obtained from the base of the skull through the vertex  without contrast.  Comparison: None.  Findings: No skull fracture or intracranial hemorrhage.  Small vessel disease type changes without CT evidence of large acute infarct.  Vascular calcifications.  No hydrocephalus.  No intracranial mass lesion detected on this unenhanced exam.  IMPRESSION: No skull fracture or intracranial hemorrhage.   Original Report Authenticated By: Lacy Duverney, M.D.    Dg Humerus Left  04/24/2012  *RADIOLOGY REPORT*  Clinical Data: Fall, shoulder pain  LEFT HUMERUS - 2+ VIEW  Comparison: None.  Findings: Two views of the left humerus submitted.  There is inferior dislocation of the left humeral head from glenohumeral joint.  IMPRESSION: Inferior shoulder dislocation.   Original Report Authenticated By: Natasha Mead, M.D.      No diagnosis found.    MDM  CT head negative. Left shoulder successfully reduced.  Patient alert, oriented, without neurological deficits at discharge        Donnetta Hutching, MD 04/24/12 1614  Donnetta Hutching, MD 04/24/12 1616

## 2012-04-24 NOTE — ED Notes (Addendum)
Patient reported to be walking, did not see 2 small steps and fell injuring his left shoulder.  Patient has pain and decreased range of motion since the fall.  He also has abrasion to his forehead and to the right knee.  Patient arrives immobilized in [place]   Patient is normally on coumadin,  Stopped on Friday for spinal injection scheduled for tomorrow.  Patient denies any back pain.  Patient denies neck pain.  Patient has some headache over the left eye.  Patient did receive fentanyl 150 mcg enroute

## 2012-04-24 NOTE — ED Notes (Signed)
Dr Cook at bedside

## 2012-04-24 NOTE — ED Notes (Signed)
Patient is resting,  Remains alert and oriented.  Aware of plan for sedation and reduction of his left shoulder.  Wife at bedside and also aware,  She signed the consent form for patient due to medications given for pain.  Patient on cardiac monitoring.  Suction set up and working.  Crash cart at doorway.  Ortho tech has been paged

## 2012-05-18 ENCOUNTER — Ambulatory Visit (INDEPENDENT_AMBULATORY_CARE_PROVIDER_SITE_OTHER): Payer: Medicare Other | Admitting: Emergency Medicine

## 2012-05-18 VITALS — BP 157/80 | HR 69 | Temp 97.5°F | Resp 16 | Ht 69.0 in | Wt 149.0 lb

## 2012-05-18 DIAGNOSIS — L0390 Cellulitis, unspecified: Secondary | ICD-10-CM

## 2012-05-18 DIAGNOSIS — L0291 Cutaneous abscess, unspecified: Secondary | ICD-10-CM

## 2012-05-18 MED ORDER — SULFAMETHOXAZOLE-TRIMETHOPRIM 800-160 MG PO TABS
1.0000 | ORAL_TABLET | Freq: Two times a day (BID) | ORAL | Status: DC
Start: 2012-05-18 — End: 2012-05-29

## 2012-05-18 NOTE — Progress Notes (Signed)
[facility] [address], [place] [address] [phone]  Date:  05/18/2012   Name:  Gregory Werner   DOB:  10/12/1924   MRN:  [id number]  PCP:  Thayer Headings, MD    Chief Complaint: Arm Pain   History of Present Illness:  Gregory Werner is a 79 y.o. very pleasant male patient who presents with the following:  Treated for dislocated shoulder and saw his FMD Monday who prescribed augmentin for an abscess of his left forearm.  No improvement with antibiotic.  No fever or chills.  Spontaneously draining.  Patient Active Problem List  Diagnosis  . HYPERLIPIDEMIA  . HYPERTENSION, BENIGN  . ATRIAL FIBRILLATION  . GERD  . Encounter for long-term (current) use of anticoagulants    Past Medical History  Diagnosis Date  . Atrial fibrillation   . Hypertension     benign  . Hyperlipidemia   . GERD (gastroesophageal reflux disease)   . Lung infiltrate 05/2010    left lower lobe infiltrate, questionable pneumonia  . Prostate cancer 2001    treated with radiation  . Dysphagia 2001    history s/p esophageal dilatation  . Diverticulosis     history  . Urinary frequency     history  . Prostate cancer     Past Surgical History  Procedure Laterality Date  . Esophageal dilation  2001  . Tonsillectomy    . Cataract extraction  2008  . Replacement total knee  2012    left    History  Substance Use Topics  . Smoking status: Former Games developer  . Smokeless tobacco: Never Used     Comment: smoked only 7 years and not everyday  . Alcohol Use: Yes    Family History  Problem Relation Age of Onset  . Heart attack Mother   . Heart attack Father     No Known Allergies  Medication list has been reviewed and updated.  Current Outpatient Prescriptions on File Prior to Visit  Medication Sig Dispense Refill  . acetaminophen (TYLENOL) 500 MG tablet Take 1,000 mg by mouth every 6 (six) hours as needed. For hip pain      . Ascorbic Acid (VITAMIN C) 1000 MG  tablet Take 1,000 mg by mouth daily.        . Calcium Citrate-Vitamin D (CITRACAL + D PO) Take 1 tablet by mouth daily.      . Cyanocobalamin (VITAMIN B 12 PO) Take 1 tablet by mouth daily.        . famotidine (PEPCID) 20 MG tablet Take 20 mg by mouth 2 (two) times daily.       . fish oil-omega-3 fatty acids 1000 MG capsule Take 1 g by mouth daily.      . Glucosamine-Chondroitin (GLUCOSAMINE CHONDR COMPLEX PO) Take 1 tablet by mouth daily.      Marland Kitchen ibandronate (BONIVA) 150 MG tablet Take 150 mg by mouth every 30 (thirty) days. Take in the morning with a full glass of water, on an empty stomach, and do not take anything else by mouth or lie down for the next 30 min.       . ramipril (ALTACE) 2.5 MG tablet Take 1 tablet (2.5 mg total) by mouth daily.  90 tablet  1  . terazosin (HYTRIN) 2 MG capsule Take 2 mg by mouth at bedtime.        Marland Kitchen warfarin (COUMADIN) 5 MG tablet Take 2.5-5 mg by mouth daily. Take 2.5mg  on  Mon, Wed, and Fri and take 5mg  on the rest of the days       No current facility-administered medications on file prior to visit.    Review of Systems:  As per HPI, otherwise negative.    Physical Examination: Filed Vitals:   05/18/12 1247  BP: 157/80  Pulse: 69  Temp: 97.5 F (36.4 C)  Resp: 16   Filed Vitals:   05/18/12 1247  Height: 5\' 9"  (1.753 m)  Weight: 149 lb (67.586 kg)   Body mass index is 21.99 kg/(m^2). Ideal Body Weight: Weight in (lb) to have BMI = 25: 168.9   GEN: WDWN, NAD, Non-toxic, Alert & Oriented x 3 HEENT: Atraumatic, Normocephalic.  Ears and Nose: No external deformity. EXTR: No clubbing/cyanosis/edema NEURO: Normal gait.  PSYCH: Normally interactive. Conversant. Not depressed or anxious appearing.  Calm demeanor.  FOREARM:  Left forearm abscess  Assessment and Plan: Abscess forearm. Dislocated shoulder. Continue sling Local heat Septra Follow up in one week  Carmelina Dane, MD

## 2012-05-18 NOTE — Patient Instructions (Addendum)
Abscess An abscess is an infected area that contains a collection of pus and debris. It can occur in almost any part of the body. An abscess is also known as a furuncle or boil. CAUSES   An abscess occurs when tissue gets infected. This can occur from blockage of oil or sweat glands, infection of hair follicles, or a minor injury to the skin. As the body tries to fight the infection, pus collects in the area and creates pressure under the skin. This pressure causes pain. People with weakened immune systems have difficulty fighting infections and get certain abscesses more often.   SYMPTOMS Usually an abscess develops on the skin and becomes a painful mass that is red, warm, and tender. If the abscess forms under the skin, you may feel a moveable soft area under the skin. Some abscesses break open (rupture) on their own, but most will continue to get worse without care. The infection can spread deeper into the body and eventually into the bloodstream, causing you to feel ill.   DIAGNOSIS   Your caregiver will take your medical history and perform a physical exam. A sample of fluid may also be taken from the abscess to determine what is causing your infection. TREATMENT   Your caregiver may prescribe antibiotic medicines to fight the infection. However, taking antibiotics alone usually does not cure an abscess. Your caregiver may need to make a small cut (incision) in the abscess to drain the pus. In some cases, gauze is packed into the abscess to reduce pain and to continue draining the area. HOME CARE INSTRUCTIONS    Only take over-the-counter or prescription medicines for pain, discomfort, or fever as directed by your caregiver.   If you were prescribed antibiotics, take them as directed. Finish them even if you start to feel better.   If gauze is used, follow your caregiver's directions for changing the gauze.   To avoid spreading the infection:   Keep your draining abscess covered with a  bandage.   Wash your hands well.   Do not share personal care items, towels, or whirlpools with others.   Avoid skin contact with others.   Keep your skin and clothes clean around the abscess.   Keep all follow-up appointments as directed by your caregiver.  SEEK MEDICAL CARE IF:    You have increased pain, swelling, redness, fluid drainage, or bleeding.   You have muscle aches, chills, or a general ill feeling.   You have a fever.  MAKE SURE YOU:    Understand these instructions.   Will watch your condition.   Will get help right away if you are not doing well or get worse.  Document Released: 12/29/2004 Document Revised: 09/20/2011 Document Reviewed: 06/03/2011 Exit[employer] Patient Information 2013 [employer].    

## 2012-05-20 IMAGING — CR DG KNEE 1-2V PORT*L*
1 series · 2 of 2 positions shown · non-contrast
Comparison: None

CLINICAL DATA: Postop left total knee replacement surgery.

PORTABLE LEFT KNEE - 1-2 VIEW

[Series 1: AP · left · 2 of 2 slices shown]
[im 1/2]
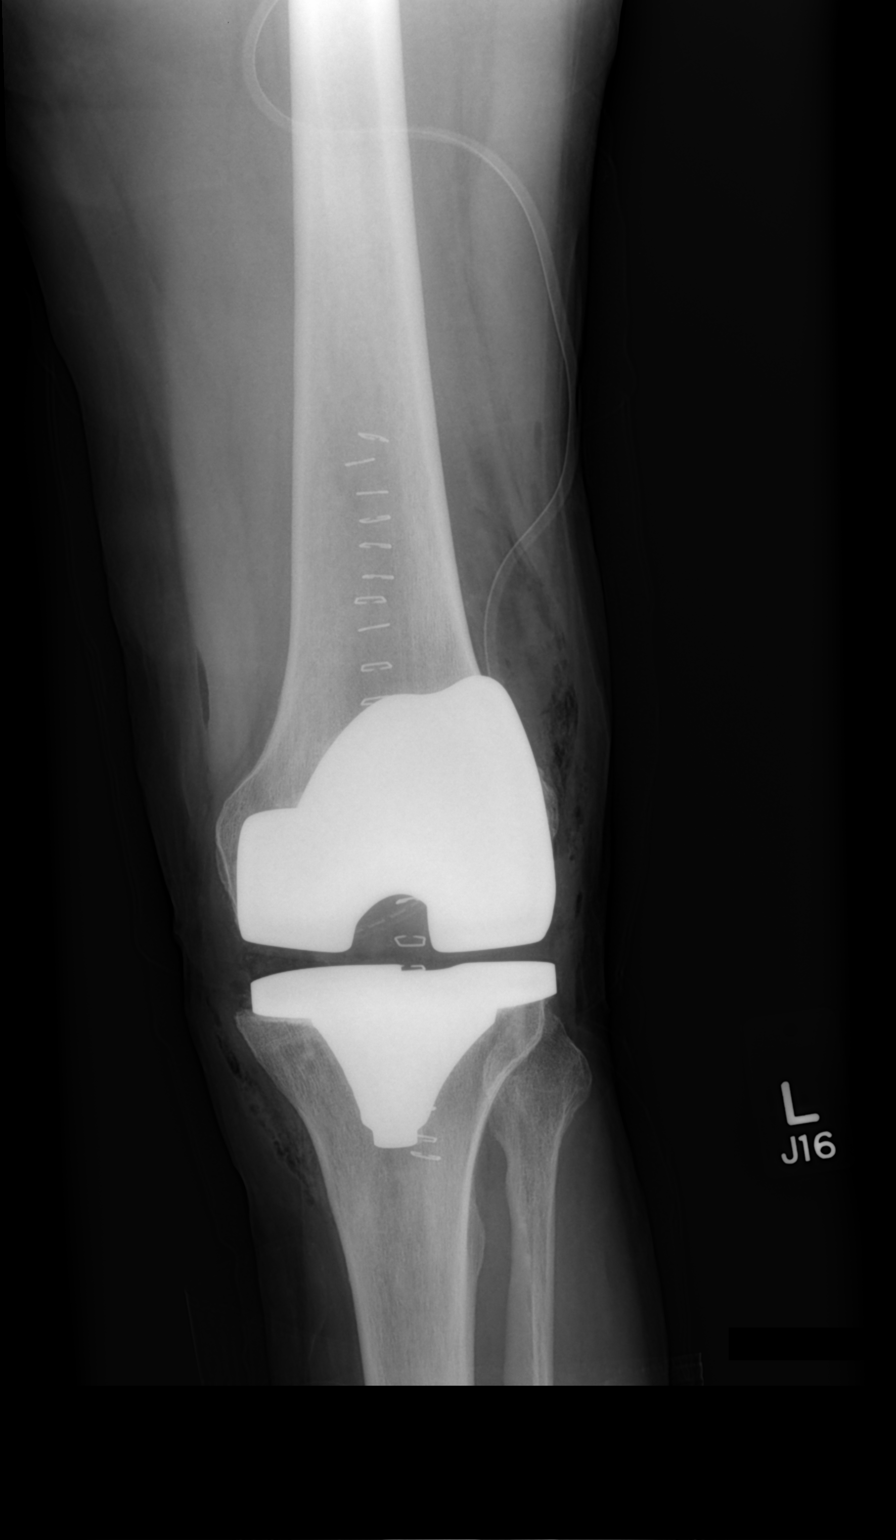
[im 2/2]
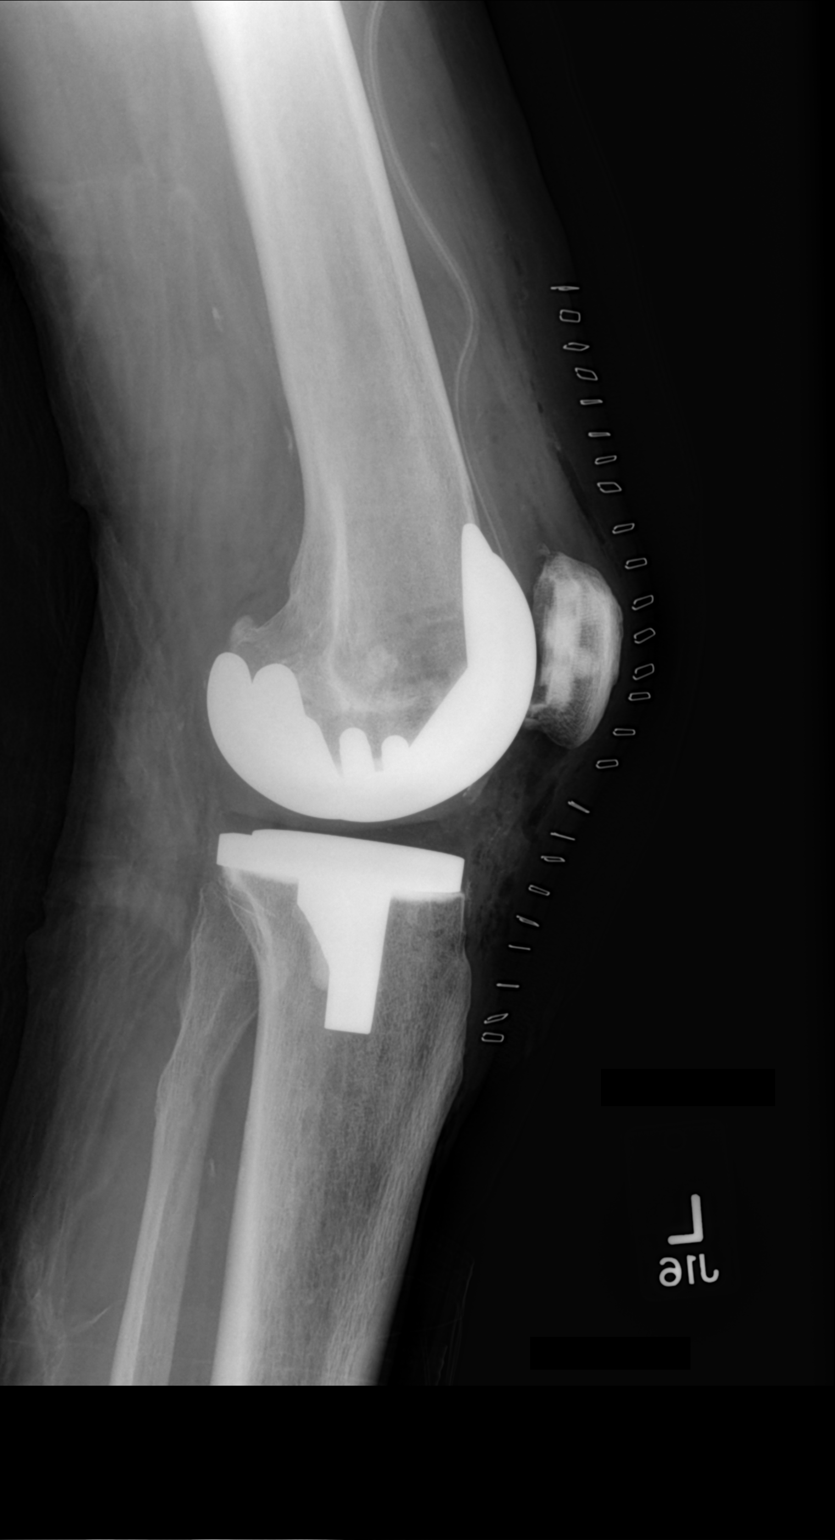

[2 of 2 positions shown; findings below may reference images not displayed]

FINDINGS: There are well seated components of a total left knee
arthroplasty.  No complicating features are demonstrated.
IMPRESSION: Well seated components of a total left knee arthroplasty.

## 2012-05-29 ENCOUNTER — Ambulatory Visit (INDEPENDENT_AMBULATORY_CARE_PROVIDER_SITE_OTHER): Payer: Medicare Other | Admitting: *Deleted

## 2012-05-29 DIAGNOSIS — Z7901 Long term (current) use of anticoagulants: Secondary | ICD-10-CM

## 2012-05-29 DIAGNOSIS — I4891 Unspecified atrial fibrillation: Secondary | ICD-10-CM

## 2012-05-29 LAB — POCT INR: INR: 4

## 2012-06-22 ENCOUNTER — Ambulatory Visit (INDEPENDENT_AMBULATORY_CARE_PROVIDER_SITE_OTHER): Payer: Medicare Other | Admitting: Pharmacist

## 2012-06-22 DIAGNOSIS — Z7901 Long term (current) use of anticoagulants: Secondary | ICD-10-CM

## 2012-06-22 DIAGNOSIS — I4891 Unspecified atrial fibrillation: Secondary | ICD-10-CM

## 2012-06-22 LAB — POCT INR: INR: 2.9

## 2012-07-20 ENCOUNTER — Other Ambulatory Visit: Payer: Self-pay | Admitting: Cardiology

## 2012-07-20 MED ORDER — WARFARIN SODIUM 5 MG PO TABS: 2.5000 mg | ORAL_TABLET | Freq: Every day | ORAL | Status: DC

## 2012-07-24 ENCOUNTER — Ambulatory Visit (INDEPENDENT_AMBULATORY_CARE_PROVIDER_SITE_OTHER): Payer: Medicare Other | Admitting: *Deleted

## 2012-07-24 ENCOUNTER — Ambulatory Visit (INDEPENDENT_AMBULATORY_CARE_PROVIDER_SITE_OTHER): Payer: Medicare Other | Admitting: Cardiology

## 2012-07-24 ENCOUNTER — Encounter: Payer: Self-pay | Admitting: Cardiology

## 2012-07-24 VITALS — BP 118/58 | HR 62 | Ht 69.0 in | Wt 148.0 lb

## 2012-07-24 DIAGNOSIS — I4891 Unspecified atrial fibrillation: Secondary | ICD-10-CM

## 2012-07-24 DIAGNOSIS — Z7901 Long term (current) use of anticoagulants: Secondary | ICD-10-CM

## 2012-07-24 LAB — POCT INR: INR: 2.5

## 2012-07-24 NOTE — Patient Instructions (Addendum)
Your physician wants you to follow-up in: 1 year. You will receive a reminder letter in the mail two months in advance. If you don't receive a letter, please call our office to schedule the follow-up appointment.  

## 2012-07-24 NOTE — Assessment & Plan Note (Signed)
Asymptomatic with good rate control and anticoagulation. Patient advised about the risk of being on Coumadin and falling. We will have him return the office in one year to see Dr. Jens Som.

## 2012-07-24 NOTE — Progress Notes (Signed)
HPI Gregory Werner returns today for evaluation and management as chronic A. fib and anticoagulation.  He's always been totally asymptomatic. He did experience a fall and winter and injured his left shoulder. There was no head injury. He's had no further falls. He denies any melena or bleeding  Past Medical History  Diagnosis Date  . Atrial fibrillation   . Hypertension     benign  . Hyperlipidemia   . GERD (gastroesophageal reflux disease)   . Lung infiltrate 05/2010    left lower lobe infiltrate, questionable pneumonia  . Prostate cancer 2001    treated with radiation  . Dysphagia 2001    history s/p esophageal dilatation  . Diverticulosis     history  . Urinary frequency     history  . Prostate cancer     Current Outpatient Prescriptions  Medication Sig Dispense Refill  . Ascorbic Acid (VITAMIN C) 1000 MG tablet Take 1,000 mg by mouth daily.        . Calcium Citrate-Vitamin D (CITRACAL + D PO) Take 1 tablet by mouth daily.      . Cyanocobalamin (VITAMIN B 12 PO) Take 1 tablet by mouth daily.        . famotidine (PEPCID) 20 MG tablet Take 20 mg by mouth 2 (two) times daily.       . fish oil-omega-3 fatty acids 1000 MG capsule Take 1 g by mouth daily.      . Glucosamine-Chondroitin (GLUCOSAMINE CHONDR COMPLEX PO) Take 1 tablet by mouth daily.      Marland Kitchen ibandronate (BONIVA) 150 MG tablet Take 150 mg by mouth every 30 (thirty) days. Take in the morning with a full glass of water, on an empty stomach, and do not take anything else by mouth or lie down for the next 30 min.       Marland Kitchen MYRBETRIQ 50 MG TB24 Take 1 tablet by mouth daily.      . ramipril (ALTACE) 2.5 MG tablet Take 1 tablet (2.5 mg total) by mouth daily.  90 tablet  1  . terazosin (HYTRIN) 2 MG capsule Take 2 mg by mouth at bedtime.        Marland Kitchen warfarin (COUMADIN) 5 MG tablet Take 0.5-1 tablets (2.5-5 mg total) by mouth daily. Take 2.5mg  on Mon, Wed, and Fri and take 5mg  on the rest of the days  30 tablet  3   No current  facility-administered medications for this visit.    No Known Allergies  Family History  Problem Relation Age of Onset  . Heart attack Mother   . Heart attack Father     History   Social History  . Marital Status: Married    Spouse Name: N/A    Number of Children: N/A  . Years of Education: N/A   Occupational History  . retired     Office manager   Social History Main Topics  . Smoking status: Former Games developer  . Smokeless tobacco: Never Used     Comment: smoked only 7 years and not everyday  . Alcohol Use: Yes  . Drug Use: No  . Sexually Active: Not on file   Other Topics Concern  . Not on file   Social History Narrative  . No narrative on file    ROS ALL NEGATIVE EXCEPT THOSE NOTED IN HPI  PE  General Appearance: well developed, well nourished in no acute distress, frail HEENT: symmetrical face, PERRLA, good dentition  Neck: no JVD, thyromegaly, or adenopathy, trachea midline  Chest: symmetric without deformity Cardiac: PMI non-displaced, irregular rate and rhythm, normal S1, S2, no gallop or murmur Lung: clear to ausculation and percussion Vascular: all pulses full without bruits  Abdominal: nondistended, nontender, good bowel sounds, no HSM, no bruits Extremities: no cyanosis, clubbing or edema, no sign of DVT, no varicosities  Skin: normal color, no rashes Neuro: alert and oriented x 3, non-focal Pysch: normal affect  EKG Chronic A. fib, incomplete right bundle, no acute changes BMET    Component Value Date/Time   NA 134* 09/26/2010 0432   K 4.1 09/26/2010 0432   CL 101 09/26/2010 0432   CO2 30 09/26/2010 0432   GLUCOSE 97 09/26/2010 0432   BUN 19 09/26/2010 0432   CREATININE 0.86 09/26/2010 0432   CALCIUM 8.4 09/26/2010 0432   GFRNONAA >60 09/26/2010 0432   GFRAA >60 09/26/2010 0432    Lipid Panel     Component Value Date/Time   CHOL  Value: 175        ATP III CLASSIFICATION:  <200     mg/dL   Desirable  914-782  mg/dL   Borderline High  >=956     mg/dL   High        05/05/3084 0140   TRIG 34 05/11/2008 0140   HDL 64 05/11/2008 0140   CHOLHDL 2.7 05/11/2008 0140   VLDL 7 05/11/2008 0140   LDLCALC  Value: 104        Total Cholesterol/HDL:CHD Risk Coronary Heart Disease Risk Table                     Men   Women  1/2 Average Risk   3.4   3.3  Average Risk       5.0   4.4  2 X Average Risk   9.6   7.1  3 X Average Risk  23.4   11.0        Use the calculated Patient Ratio above and the CHD Risk Table to determine the patient's CHD Risk.        ATP III CLASSIFICATION (LDL):  <100     mg/dL   Optimal  578-469  mg/dL   Near or Above                    Optimal  130-159  mg/dL   Borderline  629-528  mg/dL   High  >413     mg/dL   Very High* 05/08/4008 2725    CBC    Component Value Date/Time   WBC 8.3 09/26/2010 0432   RBC 2.76* 09/26/2010 0432   HGB 8.8* 09/26/2010 0432   HCT 26.3* 09/26/2010 0432   PLT 170 09/26/2010 0432   MCV 95.3 09/26/2010 0432   MCH 31.9 09/26/2010 0432   MCHC 33.5 09/26/2010 0432   RDW 13.5 09/26/2010 0432   LYMPHSABS 1.2 05/10/2008 1816   MONOABS 0.6 05/10/2008 1816   EOSABS 0.1 05/10/2008 1816   BASOSABS 0.0 05/10/2008 1816

## 2012-09-04 ENCOUNTER — Ambulatory Visit (INDEPENDENT_AMBULATORY_CARE_PROVIDER_SITE_OTHER): Payer: Medicare Other

## 2012-09-04 DIAGNOSIS — I4891 Unspecified atrial fibrillation: Secondary | ICD-10-CM

## 2012-09-04 DIAGNOSIS — Z7901 Long term (current) use of anticoagulants: Secondary | ICD-10-CM

## 2012-09-04 LAB — POCT INR: INR: 2.8

## 2012-10-16 ENCOUNTER — Ambulatory Visit (INDEPENDENT_AMBULATORY_CARE_PROVIDER_SITE_OTHER): Payer: Medicare Other | Admitting: Pharmacist

## 2012-10-16 DIAGNOSIS — Z7901 Long term (current) use of anticoagulants: Secondary | ICD-10-CM

## 2012-10-16 DIAGNOSIS — I4891 Unspecified atrial fibrillation: Secondary | ICD-10-CM

## 2012-11-09 ENCOUNTER — Other Ambulatory Visit: Payer: Self-pay | Admitting: Cardiology

## 2012-11-27 ENCOUNTER — Ambulatory Visit (INDEPENDENT_AMBULATORY_CARE_PROVIDER_SITE_OTHER): Payer: Medicare Other | Admitting: Pharmacist

## 2012-11-27 DIAGNOSIS — I4891 Unspecified atrial fibrillation: Secondary | ICD-10-CM

## 2012-11-27 DIAGNOSIS — Z7901 Long term (current) use of anticoagulants: Secondary | ICD-10-CM

## 2012-11-29 ENCOUNTER — Other Ambulatory Visit: Payer: Self-pay | Admitting: Cardiology

## 2013-01-08 ENCOUNTER — Ambulatory Visit (INDEPENDENT_AMBULATORY_CARE_PROVIDER_SITE_OTHER): Payer: Medicare Other | Admitting: *Deleted

## 2013-01-08 DIAGNOSIS — I4891 Unspecified atrial fibrillation: Secondary | ICD-10-CM

## 2013-01-08 DIAGNOSIS — Z7901 Long term (current) use of anticoagulants: Secondary | ICD-10-CM

## 2013-01-08 LAB — POCT INR: INR: 2.3

## 2013-02-19 ENCOUNTER — Ambulatory Visit (INDEPENDENT_AMBULATORY_CARE_PROVIDER_SITE_OTHER): Payer: Medicare Other | Admitting: Pharmacist Clinician (PhC)/ Clinical Pharmacy Specialist

## 2013-02-19 VITALS — BP 98/60 | HR 56

## 2013-02-19 DIAGNOSIS — I4891 Unspecified atrial fibrillation: Secondary | ICD-10-CM

## 2013-02-19 DIAGNOSIS — Z7901 Long term (current) use of anticoagulants: Secondary | ICD-10-CM

## 2013-03-05 ENCOUNTER — Other Ambulatory Visit: Payer: Self-pay | Admitting: Cardiology

## 2013-03-13 ENCOUNTER — Ambulatory Visit (INDEPENDENT_AMBULATORY_CARE_PROVIDER_SITE_OTHER): Payer: Medicare Other | Admitting: Pharmacist Clinician (PhC)/ Clinical Pharmacy Specialist

## 2013-03-13 DIAGNOSIS — I4891 Unspecified atrial fibrillation: Secondary | ICD-10-CM

## 2013-03-13 DIAGNOSIS — Z7901 Long term (current) use of anticoagulants: Secondary | ICD-10-CM

## 2013-04-02 ENCOUNTER — Ambulatory Visit: Payer: Medicare Other | Admitting: Pharmacist Clinician (PhC)/ Clinical Pharmacy Specialist

## 2013-04-03 ENCOUNTER — Ambulatory Visit (INDEPENDENT_AMBULATORY_CARE_PROVIDER_SITE_OTHER): Payer: Medicare Other | Admitting: Pharmacist Clinician (PhC)/ Clinical Pharmacy Specialist

## 2013-04-03 VITALS — BP 142/72 | HR 64

## 2013-04-03 DIAGNOSIS — Z7901 Long term (current) use of anticoagulants: Secondary | ICD-10-CM

## 2013-04-03 DIAGNOSIS — I4891 Unspecified atrial fibrillation: Secondary | ICD-10-CM

## 2013-04-06 ENCOUNTER — Ambulatory Visit: Payer: Medicare Other

## 2013-04-06 ENCOUNTER — Ambulatory Visit (INDEPENDENT_AMBULATORY_CARE_PROVIDER_SITE_OTHER): Payer: Medicare Other | Admitting: Internal Medicine

## 2013-04-06 VITALS — BP 120/80 | HR 60 | Temp 98.6°F | Resp 17 | Ht 70.0 in | Wt 151.0 lb

## 2013-04-06 DIAGNOSIS — M4800 Spinal stenosis, site unspecified: Secondary | ICD-10-CM

## 2013-04-06 DIAGNOSIS — M25551 Pain in right hip: Secondary | ICD-10-CM

## 2013-04-06 DIAGNOSIS — M25559 Pain in unspecified hip: Secondary | ICD-10-CM

## 2013-04-06 MED ORDER — HYDROCODONE-ACETAMINOPHEN 5-325 MG PO TABS: 1.0000 | ORAL_TABLET | Freq: Four times a day (QID) | ORAL | Status: DC | PRN

## 2013-04-06 MED ORDER — PREDNISONE 20 MG PO TABS: ORAL_TABLET | ORAL | Status: DC

## 2013-04-06 NOTE — Progress Notes (Addendum)
Subjective:    Patient ID: Gregory Werner, male    DOB: 28-Sep-1924, 80 y.o.   MRN: [id number]  HPI This chart was scribed for Tami Lin, MD by Thea Alken, Scribe. This patient was seen in room  and the patient's care was started at 10:26 AM.  HPI Comments: Gregory Werner is a 80 y.o. male with h/o spinal stenosis who presents to the [facility] complaining of constant, worsening, right hip pain. Pt states pain worsens when sleeping on his right side, sitting for long periods of time and crossing his legs . Pt states that the pain is relieved after walking for awhile. Nki. Pt also has hx right knee pain-has been told needs repl and has appt Dr Maureen Ralphs soon to discuss. Ricka Burdock has been replaced.  Has had recent injection for spinal stenosis by Dr. Nelva Bush but it only helped for 2 weeks.  Patient Active Problem List   Diagnosis Date Noted  . Encounter for long-term (current) use of anticoagulants 06/29/2010  . HYPERLIPIDEMIA 05/29/2008  . HYPERTENSION, BENIGN 05/29/2008  . GERD 05/29/2008  . ATRIAL FIBRILLATION 05/10/2008    - status post seed implants for prostate cancer Current outpatient prescriptions:Ascorbic Acid (VITAMIN C) 1000 MG tablet, Take 1,000 mg by mouth daily.  , Disp: , Rfl: ;   Calcium Citrate-Vitamin D (CITRACAL + D PO), Take 1 tablet by mouth daily., Disp: , Rfl: ;   Cyanocobalamin (VITAMIN B 12 PO), Take 1 tablet by mouth daily.  , Disp: , Rfl: ;   famotidine (PEPCID) 20 MG tablet, Take 20 mg by mouth 2 (two) times daily. , Disp: , Rfl:  fish oil-omega-3 fatty acids 1000 MG capsule, Take 1 g by mouth daily., Disp: , Rfl: ;   Glucosamine-Chondroitin (GLUCOSAMINE CHONDR COMPLEX PO), Take 1 tablet by mouth daily., Disp: , Rfl: ;   ibandronate (BONIVA) 150 MG tablet, Take 150 mg by mouth every 30 (thirty) days. Take in the morning with a full glass of water, on an empty stomach, and do not take anything else by mouth or lie down for the next 30 min. ,  Disp: , Rfl:  MYRBETRIQ 50 MG TB24, Take 1 tablet by mouth daily., Disp: , Rfl: ;   ramipril (ALTACE) 2.5 MG capsule, TAKE ONE CAPSULE BY MOUTH ONE TIME DAILY, Disp: 90 capsule, Rfl: 3;   ramipril (ALTACE) 2.5 MG tablet, Take 1 tablet (2.5 mg total) by mouth daily., Disp: 90 tablet, Rfl: 1;   terazosin (HYTRIN) 2 MG capsule, Take 2 mg by mouth at bedtime.  , Disp: , Rfl:  warfarin (COUMADIN) 5 MG tablet, TAKE AS DIRECTED BY ANTICOAGULATION CLINIC., Disp: 35 tablet, Rfl: 3  Past Medical History  Diagnosis Date  . Atrial fibrillation   . Hypertension     benign  . Hyperlipidemia   . GERD (gastroesophageal reflux disease)   . Lung infiltrate 05/2010    left lower lobe infiltrate, questionable pneumonia  . Prostate cancer 2001    treated with radiation  . Dysphagia 2001    history s/p esophageal dilatation  . Diverticulosis     history  . Urinary frequency     history  . Prostate cancer   . Arthritis    Past Surgical History  Procedure Laterality Date  . Esophageal dilation  2001  . Tonsillectomy    . Cataract extraction  2008  . Replacement total knee  2012    left  . Eye surgery    .  Prostate surgery    . Joint replacement    . Vasectomy     Family History  Problem Relation Age of Onset  . Heart attack Mother   . Heart attack Father   . Stroke Father    History   Social History  . Marital Status: Married    Spouse Name: N/A    Number of Children: N/A  . Years of Education: N/A   Occupational History  . retired     Water quality scientist   Social History Main Topics  . Smoking status: Former Research scientist (life sciences)  . Smokeless tobacco: Never Used     Comment: smoked only 7 years and not everyday  . Alcohol Use: Yes  . Drug Use: No  . Sexual Activity: No   Other Topics Concern  . Not on file   Social History Narrative  . No narrative on file     Review of Systems No fever chills or night sweats  No shortness of breath Heart rhythm has been stable No problems with  Coumadin therapy recently No changes in bowel or bladder function    Objective:   Physical Exam BP 120/80  Pulse 60  Temp(Src) 98.6 F (37 C) (Oral)  Resp 17  Ht 5\' 10"  (1.778 m)  Wt 151 lb (68.493 kg)  BMI 21.67 kg/m2  SpO2 99% No acute distress The right hip is not particularly tender to palpation or range of motion except for external rotation/he is tight. There is no swelling or redness Right knee is swollen and has a painful range of motion but there is no effusion. Straight leg raise at least to 75 does not exacerbate radicular symptoms No sensory losses     [facility] reading (PRIMARY) by  Dr. Genevieve Arbaugh= mild joint space narrowing right hip but no destructive lesions   Assessment & Plan:  I have completed the patient encounter in its entirety as documented by the scribe, with editing by me where necessary. Cassey Bacigalupo P. Laney Pastor, M.D.  Hip pain-? Related to change of gait from right knee pain  Spinal stenosis Osteoarthritis knee  Meds ordered this encounter  Medications  . predniSONE (DELTASONE) 20 MG tablet    Sig: 3/3/2/2/1/1 single daily dose for 6 days    Dispense:  12 tablet    Refill:  0  . HYDROcodone-acetaminophen (NORCO/VICODIN) 5-325 MG per tablet    Sig: Take 1 tablet by mouth every 6 (six) hours as needed for moderate pain.    Dispense:  30 tablet    Refill:  0   Follow up with orthopedics in 10 days as planned

## 2013-04-07 NOTE — Addendum Note (Signed)
Addended by: Leandrew Koyanagi on: 04/07/2013 09:21 AM   Modules accepted: Level of Service

## 2013-04-25 ENCOUNTER — Telehealth: Payer: Self-pay | Admitting: *Deleted

## 2013-04-25 NOTE — Telephone Encounter (Signed)
Received surgical clearance form from Dr. Wynelle Link office regarding (08/19/13) TKA scheduled Former Dr. Verl Blalock pt was referred to Dr. Stanford Breed by Dr. Verl Blalock After talking with pt, I have scheduled him for a March appointment for follow-up & surgical clearance  Mailed reminder to pt.  Horton Chin RN

## 2013-05-14 ENCOUNTER — Ambulatory Visit (INDEPENDENT_AMBULATORY_CARE_PROVIDER_SITE_OTHER): Payer: Medicare Other | Admitting: Pharmacist Clinician (PhC)/ Clinical Pharmacy Specialist

## 2013-05-14 VITALS — BP 132/68 | HR 72

## 2013-05-14 DIAGNOSIS — I4891 Unspecified atrial fibrillation: Secondary | ICD-10-CM

## 2013-05-14 DIAGNOSIS — Z7901 Long term (current) use of anticoagulants: Secondary | ICD-10-CM

## 2013-05-14 LAB — POCT INR: INR: 3.3

## 2013-06-04 ENCOUNTER — Ambulatory Visit (INDEPENDENT_AMBULATORY_CARE_PROVIDER_SITE_OTHER): Payer: Medicare Other | Admitting: Pharmacist Clinician (PhC)/ Clinical Pharmacy Specialist

## 2013-06-04 VITALS — BP 138/60 | HR 72

## 2013-06-04 DIAGNOSIS — I4891 Unspecified atrial fibrillation: Secondary | ICD-10-CM

## 2013-06-04 DIAGNOSIS — Z7901 Long term (current) use of anticoagulants: Secondary | ICD-10-CM

## 2013-06-04 LAB — POCT INR: INR: 2.9

## 2013-06-07 ENCOUNTER — Encounter: Payer: Self-pay | Admitting: Cardiology

## 2013-06-07 ENCOUNTER — Ambulatory Visit (INDEPENDENT_AMBULATORY_CARE_PROVIDER_SITE_OTHER): Payer: Medicare Other | Admitting: Cardiology

## 2013-06-07 VITALS — BP 154/64 | HR 68 | Ht 70.0 in | Wt 153.2 lb

## 2013-06-07 DIAGNOSIS — I10 Essential (primary) hypertension: Secondary | ICD-10-CM

## 2013-06-07 DIAGNOSIS — I6529 Occlusion and stenosis of unspecified carotid artery: Secondary | ICD-10-CM

## 2013-06-07 DIAGNOSIS — I4891 Unspecified atrial fibrillation: Secondary | ICD-10-CM

## 2013-06-07 NOTE — Patient Instructions (Addendum)
The current medical regimen is effective;  continue present plan and medications.  Your physician has requested that you have a carotid duplex. This test is an ultrasound of the carotid arteries in your neck. It looks at blood flow through these arteries that supply the brain with blood. Allow one hour for this exam. There are no restrictions or special instructions.  Follow up in 6 months with Dr Percival Spanish.  You will receive a letter in the mail 2 months before you are due.  Please call us when you receive this letter to schedule your follow up appointment.

## 2013-06-07 NOTE — Progress Notes (Signed)
HPI The patient presents as a new patient.  He has seen Dr. Verl Blalock in the past.  He has a history of atrial fib.  He is going to have a knee replacement.  He is somewhat limited by this but he can walk up stairs.  The patient denies any new symptoms such as chest discomfort, neck or arm discomfort. There has been no new shortness of breath, PND or orthopnea. There have been no reported palpitations, presyncope or syncope.  He does not notice his atrial fib.  He tolerates the blood thinner.   No Known Allergies  Current Outpatient Prescriptions  Medication Sig Dispense Refill  . Ascorbic Acid (VITAMIN C) 1000 MG tablet Take 1,000 mg by mouth daily.        . Calcium Citrate-Vitamin D (CITRACAL + D PO) Take 1 tablet by mouth daily.      . Cyanocobalamin (VITAMIN B 12 PO) Take 1 tablet by mouth daily.        . famotidine (PEPCID) 20 MG tablet Take 20 mg by mouth 2 (two) times daily.       . fish oil-omega-3 fatty acids 1000 MG capsule Take 1 g by mouth daily.      . furosemide (LASIX) 20 MG tablet Take 40 mg by mouth daily.      . Glucosamine-Chondroitin (GLUCOSAMINE CHONDR COMPLEX PO) Take 1 tablet by mouth daily.      Marland Kitchen ibandronate (BONIVA) 150 MG tablet Take 150 mg by mouth every 30 (thirty) days. Take in the morning with a full glass of water, on an empty stomach, and do not take anything else by mouth or lie down for the next 30 min.       Marland Kitchen MYRBETRIQ 50 MG TB24 Take 1 tablet by mouth daily.      . ramipril (ALTACE) 2.5 MG capsule TAKE ONE CAPSULE BY MOUTH ONE TIME DAILY  90 capsule  3  . terazosin (HYTRIN) 2 MG capsule Take 2 mg by mouth at bedtime.        Marland Kitchen warfarin (COUMADIN) 5 MG tablet TAKE AS DIRECTED BY ANTICOAGULATION CLINIC.  35 tablet  3   No current facility-administered medications for this visit.    Past Medical History  Diagnosis Date  . Atrial fibrillation   . Hypertension     benign  . Hyperlipidemia   . GERD (gastroesophageal reflux disease)   . Lung infiltrate  05/2010    left lower lobe infiltrate, questionable pneumonia  . Prostate cancer 2001    treated with radiation  . Dysphagia 2001    history s/p esophageal dilatation  . Diverticulosis     history  . Urinary frequency     history  . Prostate cancer   . Arthritis     Past Surgical History  Procedure Laterality Date  . Esophageal dilation  2001  . Tonsillectomy    . Cataract extraction  2008  . Replacement total knee  2012    left  . Eye surgery    . Prostate surgery    . Joint replacement    . Vasectomy      ROS:v  As stated in the HPI and negative for all other systems.  PHYSICAL EXAM BP 154/64  Pulse 68  Ht 5\' 10"  (1.778 m)  Wt 153 lb 3.2 oz (69.491 kg)  BMI 21.98 kg/m2 GENERAL:  Well appearing but slight.   HEENT:  Pupils equal round and reactive, fundi not visualized, oral mucosa unremarkable NECK:  No jugular venous distention, waveform within normal limits, carotid upstroke brisk and symmetric, slight right bruit, no thyromegaly LYMPHATICS:  No cervical, inguinal adenopathy LUNGS:  Clear to auscultation bilaterally, lordosis3 BACK:  No CVA tenderness CHEST:  Unremarkable HEART:  PMI not displaced or sustained,S1 and S2 within normal limits, no S3,  no clicks, no rubs, slight early peaking systolic  murmurs, irregular ABD:  Flat, positive bowel sounds normal in frequency in pitch, no bruits, no rebound, no guarding, no midline pulsatile mass, no hepatomegaly, no splenomegaly EXT:  2 plus pulses throughout, mild edema, no cyanosis no clubbing, chronic venous stasis changes.  SKIN:  Diffuse rash on trunk and face no nodules NEURO:  Cranial nerves II through XII grossly intact, motor grossly intact throughout PSYCH:  Cognitively intact, oriented to person place and time   EKG:  Atrial fibrillation, rate 64, leftward axis, poor anterior R wave progression, premature ectopic complexes, no acute ST-T wave changes.  06/07/2013   ASSESSMENT AND PLAN  ATRIAL FIB:  The  patient  tolerates this rhythm and rate control and anticoagulation. We will continue with the meds as listed.  He can stop the warfarin as needed.  The patient is not interested in switching to a novel oral anticoagulant.    PREOP:  The patient has a moderate functional level with greater than 4 METS of routine activity.Marland Kitchen  He is not going for a high-risk procedure. He has no high-risk symptoms or findings. Therefore, based on ACC/AHA guidelines, the patient would be at acceptable risk for the planned procedure without further cardiovascular testing.  CAROTID BRUIT:  He will need a Doppler.

## 2013-06-18 ENCOUNTER — Encounter: Payer: Self-pay | Admitting: Cardiovascular Disease

## 2013-06-18 ENCOUNTER — Ambulatory Visit (HOSPITAL_COMMUNITY): Payer: Medicare Other | Attending: Cardiovascular Disease | Admitting: Cardiology

## 2013-06-18 DIAGNOSIS — R0989 Other specified symptoms and signs involving the circulatory and respiratory systems: Secondary | ICD-10-CM

## 2013-06-18 DIAGNOSIS — I6529 Occlusion and stenosis of unspecified carotid artery: Secondary | ICD-10-CM

## 2013-06-18 NOTE — Progress Notes (Signed)
Carotid duplex complete 

## 2013-06-27 ENCOUNTER — Institutional Professional Consult (permissible substitution): Payer: Medicare Other | Admitting: Cardiology

## 2013-07-05 ENCOUNTER — Ambulatory Visit: Payer: Medicare Other | Admitting: Pharmacist Clinician (PhC)/ Clinical Pharmacy Specialist

## 2013-07-08 ENCOUNTER — Ambulatory Visit (INDEPENDENT_AMBULATORY_CARE_PROVIDER_SITE_OTHER): Payer: Medicare Other | Admitting: Pharmacist Clinician (PhC)/ Clinical Pharmacy Specialist

## 2013-07-08 DIAGNOSIS — Z7901 Long term (current) use of anticoagulants: Secondary | ICD-10-CM

## 2013-07-08 DIAGNOSIS — I4891 Unspecified atrial fibrillation: Secondary | ICD-10-CM

## 2013-07-08 LAB — POCT INR: INR: 3.2

## 2013-07-29 NOTE — Progress Notes (Signed)
Surgery on 08/19/13.  Need orders in EPIC.  Thank You.  

## 2013-07-30 ENCOUNTER — Telehealth: Payer: Self-pay | Admitting: Cardiology

## 2013-07-30 NOTE — Telephone Encounter (Signed)
completed

## 2013-07-30 NOTE — Telephone Encounter (Signed)
New Message  Pt called. Requests that we send a cardiac clearance for knee replacement to the office of Dr. Wynelle Link With [facility]//   Fax # [phone]

## 2013-08-02 ENCOUNTER — Encounter (HOSPITAL_COMMUNITY): Payer: Self-pay | Admitting: Pharmacy Technician

## 2013-08-03 ENCOUNTER — Other Ambulatory Visit: Payer: Self-pay | Admitting: Orthopedic Surgery

## 2013-08-05 ENCOUNTER — Ambulatory Visit (INDEPENDENT_AMBULATORY_CARE_PROVIDER_SITE_OTHER): Payer: Medicare Other | Admitting: Pharmacist Clinician (PhC)/ Clinical Pharmacy Specialist

## 2013-08-05 DIAGNOSIS — Z7901 Long term (current) use of anticoagulants: Secondary | ICD-10-CM

## 2013-08-05 DIAGNOSIS — I4891 Unspecified atrial fibrillation: Secondary | ICD-10-CM

## 2013-08-05 LAB — POCT INR: INR: 2.3

## 2013-08-09 ENCOUNTER — Encounter (HOSPITAL_COMMUNITY)
Admission: RE | Admit: 2013-08-09 | Discharge: 2013-08-09 | Disposition: A | Discharge status: Home / Self Care | Payer: Medicare Other | Source: Ambulatory Visit | Attending: Orthopedic Surgery | Admitting: Orthopedic Surgery

## 2013-08-09 ENCOUNTER — Ambulatory Visit (HOSPITAL_COMMUNITY)
Admission: RE | Admit: 2013-08-09 | Discharge: 2013-08-09 | Disposition: A | Discharge status: Home / Self Care | Payer: Medicare Other | Source: Ambulatory Visit | Attending: Anesthesiology | Admitting: Anesthesiology

## 2013-08-09 ENCOUNTER — Encounter (HOSPITAL_COMMUNITY): Payer: Self-pay

## 2013-08-09 DIAGNOSIS — F17200 Nicotine dependence, unspecified, uncomplicated: Secondary | ICD-10-CM | POA: Insufficient documentation

## 2013-08-09 DIAGNOSIS — Z01818 Encounter for other preprocedural examination: Secondary | ICD-10-CM | POA: Insufficient documentation

## 2013-08-09 DIAGNOSIS — Z01812 Encounter for preprocedural laboratory examination: Secondary | ICD-10-CM | POA: Insufficient documentation

## 2013-08-09 DIAGNOSIS — I517 Cardiomegaly: Secondary | ICD-10-CM | POA: Insufficient documentation

## 2013-08-09 HISTORY — DX: Cardiac arrhythmia, unspecified: I49.9

## 2013-08-09 HISTORY — DX: Unspecified hearing loss, unspecified ear: H91.90

## 2013-08-09 LAB — COMPREHENSIVE METABOLIC PANEL
ALBUMIN: 3.5 g/dL (ref 3.5–5.2)
ALT: 20 U/L (ref 0–53)
AST: 25 U/L (ref 0–37)
Alkaline Phosphatase: 72 U/L (ref 39–117)
BILIRUBIN TOTAL: 0.4 mg/dL (ref 0.3–1.2)
BUN: 29 mg/dL — AB (ref 6–23)
CHLORIDE: 98 meq/L (ref 96–112)
CO2: 28 mEq/L (ref 19–32)
Calcium: 9.3 mg/dL (ref 8.4–10.5)
Creatinine, Ser: 1.06 mg/dL (ref 0.50–1.35)
GFR calc Af Amer: 71 mL/min — ABNORMAL LOW (ref >90)
GFR calc non Af Amer: 61 mL/min — ABNORMAL LOW (ref >90)
Glucose, Bld: 99 mg/dL (ref 70–99)
POTASSIUM: 4.7 meq/L (ref 3.7–5.3)
SODIUM: 136 meq/L — AB (ref 137–147)
Total Protein: 6.5 g/dL (ref 6.0–8.3)

## 2013-08-09 LAB — CBC
HCT: 33.2 % — ABNORMAL LOW (ref 39.0–52.0)
Hemoglobin: 10.8 g/dL — ABNORMAL LOW (ref 13.0–17.0)
MCH: 31.7 pg (ref 26.0–34.0)
MCHC: 32.5 g/dL (ref 30.0–36.0)
MCV: 97.4 fL (ref 78.0–100.0)
PLATELETS: 202 10*3/uL (ref 150–400)
RBC: 3.41 MIL/uL — ABNORMAL LOW (ref 4.22–5.81)
RDW: 13.5 % (ref 11.5–15.5)
WBC: 5.8 10*3/uL (ref 4.0–10.5)

## 2013-08-09 LAB — URINALYSIS, ROUTINE W REFLEX MICROSCOPIC
Bilirubin Urine: NEGATIVE
Glucose, UA: NEGATIVE mg/dL
HGB URINE DIPSTICK: NEGATIVE
Ketones, ur: NEGATIVE mg/dL
Leukocytes, UA: NEGATIVE
Nitrite: NEGATIVE
PH: 7 (ref 5.0–8.0)
Protein, ur: NEGATIVE mg/dL
Specific Gravity, Urine: 1.023 (ref 1.005–1.030)
UROBILINOGEN UA: 2 mg/dL — AB (ref 0.0–1.0)

## 2013-08-09 LAB — PROTIME-INR
INR: 2.22 — ABNORMAL HIGH (ref 0.00–1.49)
PROTHROMBIN TIME: 23.9 s — AB (ref 11.6–15.2)

## 2013-08-09 LAB — SURGICAL PCR SCREEN
MRSA, PCR: POSITIVE — AB
STAPHYLOCOCCUS AUREUS: POSITIVE — AB

## 2013-08-09 LAB — APTT: APTT: 43 s — AB (ref 24–37)

## 2013-08-09 NOTE — Pre-Procedure Instructions (Addendum)
08-09-13  EKG 3'15 Epic, 07-23-13 CBC/d,CMP(abnormal)- repeated today. CXR done today. 08-09-13 1700 Pt. And Dr. Anne Fu office made aware of Positive MRSA -PCR screen ,Rx for Mupirocin called to [facility]. Pt. To use as directed by Nicholaus Corolla,.

## 2013-08-09 NOTE — Patient Instructions (Signed)
Gregory Werner  08/09/2013   Your procedure is scheduled on:5-18   -2015  Enter through [facility] Entrance and follow signs to [place]. Arrive at   0600     AM.  Call this number if you have problems the morning of surgery: [phone]  Or Presurgical Testing [phone](Gregory Werner) For Living Will and/or Health Care Power Attorney Forms: please provide copy for your medical record,may bring AM of surgery(Forms should be already notarized -we do not provide this service).(Yes/ to bring AM of 08-19-13.).     Do not eat food:After Midnight.    Take these medicines the morning of surgery with A SIP OF WATER: none except Tylenol.   Do not wear jewelry, make-up or nail polish.  Do not wear lotions, powders, or perfumes. You may wear deodorant.  Do not shave 48 hours(2 days) prior to first CHG shower(legs and under arms).(Shaving face and neck okay.)  Do not bring valuables to the hospital.(Hospital is not responsible for lost valuables).  Contacts, dentures or removable bridgework, body piercing, hair pins may not be worn into surgery.  Leave suitcase in the car. After surgery it may be brought to your room.  For patients admitted to the hospital, checkout time is 11:00 AM the day of discharge.(Restricted visitors-Any Persons displaying flu-like symptoms or illness).    Patients discharged the day of surgery will not be allowed to drive home. Must have responsible person with you x 24 hours once discharged.  Name and phone number of your driver:  Gregory Werner -spouse  [phone] Special Instructions: CHG(Chlorhedine 4%-"Hibiclens","Betasept","Aplicare") Shower Use Special Wash: see special instructions.(avoid face and genitals)   Please read over the following fact sheets that you were given: MRSA Information, Blood Transfusion fact sheet, Incentive Spirometry Instruction.  Remember : Type/Screen "Blue armbands" - may not be removed once applied(would result in being  retested AM of surgery, if removed).  Failure to follow these instructions may result in Cancellation of your surgery.   Patient signature_______________________________________________________

## 2013-08-12 NOTE — Progress Notes (Signed)
08-12-13 0900 -Pt. Aware of Positive MRSA and need for Contact Isolation during his stay. Spoke with spouse and pt. Using ointment as directed.Note to Dr. Anne Fu office to note labs viewable in Epic.Will repeat PT/INR AM of.Pt to stop warfarin 08-13-13. W. Floy Sabina

## 2013-08-18 ENCOUNTER — Other Ambulatory Visit: Payer: Self-pay | Admitting: Orthopedic Surgery

## 2013-08-18 NOTE — H&P (Signed)
Gregory Werner DOB: 07-May-1924 Married / Language: English / Race: White Male  Date of Admission:  08-19-2013  Chief Complaint:  Right Knee Pain  History of Present Illness The patient is a 80 year old male who comes in for a preoperative History and Physical. The patient is scheduled for a right total knee arthroplasty to be performed by Dr. Dione Plover. Aluisio, MD at [facility] on 08-19-2013. The patient is a 80 year old male who presents today for follow up of their knee. The patient is being followed for their right knee pain. Symptoms reported today include: pain, aching, throbbing, catching and giving way. The patient feels that they are doing poorly and report their pain level to be moderate to severe. The patient indicates that they have questions or concerns today regarding pain and activity. He is now ready to proceed with the knee replacement. They have been treated conservatively in the past for the above stated problem and despite conservative measures, they continue to have progressive pain and severe functional limitations and dysfunction. They have failed non-operative management including home exercise, medications, and injections. It is felt that they would benefit from undergoing total joint replacement. Risks and benefits of the procedure have been discussed with the patient and they elect to proceed with surgery. There are no active contraindications to surgery such as ongoing infection or rapidly progressive neurological disease.  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 mother Heart Disease. Father. father    Social History Illicit drug use. no Exercise. does other Drug/Alcohol Rehab (Previously). no Number of flights of stairs before winded. less than 1 Marital status. married Living situation. live with spouse Current work status. working part time Children. 3 Alcohol use. current drinker; drinks beer; 5-7 per week Tobacco use. Never smoker. former smoker; smoke(d) less than 1/2 pack(s) per day Tobacco / smoke exposure. no Pain Contract. no Post-Surgical Plans. Plan is to look into [facility]. Advance Directives. Living Will, Healthcare POA    Medication History Warfarin Sodium ( Oral) Specific dose unknown - Active. Ramipril (2.5MG  Capsule, Oral) Active. Terazosin HCl (2MG  Capsule, Oral) Active. Ibandronate Sodium (150MG  Tablet, Oral) Active. Myrbetriq (50MG  Tablet ER 24HR, Oral) Active. Furosemide (40MG  Tablet, Oral) Active. CeleBREX (200MG  Capsule, Oral) Active. Fish Oil ( Oral) Specific dose unknown - Active. Glucosamine Chondr 1500 Complx ( Oral) Active. Calcium Citrate (1 (one) Oral) Specific dose unknown - Active. Vitamin C CR ( Oral) Specific dose unknown - Active. Vitamin B12 ( Oral) Specific dose unknown - Active. Famotidine (20MG  Tablet, Oral) Active. Acetaminophen (500MG  Tablet, Oral) Active.    Past Surgical History Straighten Nasal Septum Cataract Surgery. bilateral Tonsillectomy Vasectomy Total Knee Replacement. left   Review of Systems General:Not Present- Chills, Fever, Night Sweats, Fatigue, Weight Gain, Weight Loss and Memory Loss. Skin:Not Present- Hives, Itching, Rash, Eczema and Lesions. HEENT:Not Present- Tinnitus, Headache, Double Vision, Visual Loss, Hearing Loss and  Dentures. Respiratory:Not Present- Shortness of breath with exertion, Shortness of breath at rest, Allergies, Coughing up blood and Chronic Cough. Cardiovascular:Not Present- Chest Pain, Racing/skipping  heartbeats, Difficulty Breathing Lying Down, Murmur, Swelling and Palpitations. Gastrointestinal:Not Present- Bloody Stool, Heartburn, Abdominal Pain, Vomiting, Nausea, Constipation, Diarrhea, Difficulty Swallowing, Jaundice and Loss of appetitie. Male Genitourinary:Not Present- Urinary frequency, Blood in Urine, Weak urinary stream, Discharge, Flank Pain, Incontinence, Painful Urination, Urgency, Urinary Retention and Urinating at Night. Musculoskeletal:Present- Joint Pain and Morning Stiffness. Not Present- Muscle Weakness, Muscle Pain, Joint Swelling, Back Pain and Spasms. Neurological:Not Present- Tremor, Dizziness, Blackout spells, Paralysis, Difficulty with balance and Weakness. Psychiatric:Not Present- Insomnia.    Vitals Weight: 150 lb Height: 70 in Weight was reported by patient. Body Surface Area: 1.83 m Body Mass Index: 21.52 kg/m Pulse: 60 (Regular) Resp.: 14 (Unlabored) BP: 146/70 (Sitting, Right Arm, Standard)     Physical Exam The physical exam findings are as follows:   General Mental Status - Alert, cooperative and good historian. General Appearance- pleasant. Not in acute distress. Orientation- Oriented X3. Build & Nutrition- Cachectic (slightly), Lean and Well developed.   Head and Neck Head- normocephalic, atraumatic . Neck Global Assessment- supple. no bruit auscultated on the right and no bruit auscultated on the left.   Note: Bilateral hearing aids.  Eye Vision- Wears corrective lenses. Pupil- Bilateral- Regular and Round. Motion- Bilateral- EOMI.   Chest and Lung Exam Auscultation: Breath sounds:- clear at anterior chest wall and - clear at posterior chest wall. Adventitious sounds:- No Adventitious  sounds.   Cardiovascular Auscultation:Rhythm- Regular rate and rhythm. Heart Sounds- S1 WNL and S2 WNL. Murmurs & Other Heart Sounds:Auscultation of the heart reveals - No Murmurs.   Abdomen Palpation/Percussion:Tenderness- Abdomen is non-tender to palpation. Rigidity (guarding)- Abdomen is soft. Auscultation:Auscultation of the abdomen reveals - Bowel sounds normal.   Male Genitourinary Not done, not pertinent to present illness  Musculoskeletal  He is a well developed male. He is alert and oriented. No apparent distress. The hips both show a normal range of motion without pain on range of motion. He is very tender over the right greater trochanter and palpation there reproduces his pain. He does not have any tenderness along the left hip. The left knee range is about 5-120 with no tenderness or instability. The right knee range is 15-115 with marked crepitus on range of motion. There is tenderness medial greater than lateral with no instability noted. Pulse, sensation and motor are intact both lower extremities.  RADIOGRAPHS: AP pelvis and lateral of the right hip show no arthritis, acute or chronic bony abnormalities. AP both knees and lateral at the right show the left knee has kicked into varus. This is a left knee replacement. The joint space medially is more narrow than laterally, meaning he has eccentric polyethylene wear. His right knee shows a severe tricompartmental bone on bone arthritis with bone on bone in all three compartments and massive osteophyte formation.   Assessment & Plan Osteoarthritis, Knee (715.96) Impression: Right Knee  Note: Plan is for a Right Total Knee Replacement by Dr. Wynelle Link.  Plan is to go to [facility] again following the surgery.  PCP - Dr. Thressa Sheller Cardiology - Dr. Percival Spanish  The patient does not have any contraindications and will receive TXA (tranexamic acid) prior to surgery.  Please note that the patient is on  warfarin for A.Fib and will stop this medication five days prior to surgery. He will be plaed back onto the warfarin postoperatively along with a lovenox bridge until he is therapeutic with the warfarin.  Signed electronically by Joelene Millin, III PA-C

## 2013-08-19 ENCOUNTER — Encounter (HOSPITAL_COMMUNITY)
Admission: RE | Disposition: A | Discharge status: Skilled Nursing Facility | Payer: Self-pay | Source: Ambulatory Visit | Attending: Orthopedic Surgery

## 2013-08-19 ENCOUNTER — Encounter (HOSPITAL_COMMUNITY): Payer: Self-pay | Admitting: Certified Registered Nurse Anesthetist

## 2013-08-19 ENCOUNTER — Encounter (HOSPITAL_COMMUNITY): Payer: Medicare Other | Admitting: Certified Registered Nurse Anesthetist

## 2013-08-19 ENCOUNTER — Inpatient Hospital Stay (HOSPITAL_COMMUNITY): Payer: Medicare Other | Admitting: Certified Registered Nurse Anesthetist

## 2013-08-19 ENCOUNTER — Inpatient Hospital Stay (HOSPITAL_COMMUNITY)
Admission: RE | Admit: 2013-08-19 | Discharge: 2013-08-22 | DRG: 470 | Disposition: A | Discharge status: Skilled Nursing Facility | Payer: Medicare Other | Source: Ambulatory Visit | Attending: Orthopedic Surgery | Admitting: Orthopedic Surgery

## 2013-08-19 DIAGNOSIS — M1710 Unilateral primary osteoarthritis, unspecified knee: Secondary | ICD-10-CM | POA: Diagnosis present

## 2013-08-19 DIAGNOSIS — E785 Hyperlipidemia, unspecified: Secondary | ICD-10-CM | POA: Diagnosis present

## 2013-08-19 DIAGNOSIS — Z8546 Personal history of malignant neoplasm of prostate: Secondary | ICD-10-CM

## 2013-08-19 DIAGNOSIS — M179 Osteoarthritis of knee, unspecified: Secondary | ICD-10-CM

## 2013-08-19 DIAGNOSIS — Z96651 Presence of right artificial knee joint: Secondary | ICD-10-CM

## 2013-08-19 DIAGNOSIS — E871 Hypo-osmolality and hyponatremia: Secondary | ICD-10-CM | POA: Diagnosis present

## 2013-08-19 DIAGNOSIS — Z87891 Personal history of nicotine dependence: Secondary | ICD-10-CM | POA: Diagnosis not present

## 2013-08-19 DIAGNOSIS — M25569 Pain in unspecified knee: Secondary | ICD-10-CM | POA: Diagnosis present

## 2013-08-19 DIAGNOSIS — K219 Gastro-esophageal reflux disease without esophagitis: Secondary | ICD-10-CM | POA: Diagnosis present

## 2013-08-19 DIAGNOSIS — I4891 Unspecified atrial fibrillation: Secondary | ICD-10-CM | POA: Diagnosis present

## 2013-08-19 DIAGNOSIS — Z923 Personal history of irradiation: Secondary | ICD-10-CM

## 2013-08-19 DIAGNOSIS — IMO0002 Reserved for concepts with insufficient information to code with codable children: Secondary | ICD-10-CM | POA: Diagnosis not present

## 2013-08-19 DIAGNOSIS — I10 Essential (primary) hypertension: Secondary | ICD-10-CM | POA: Diagnosis present

## 2013-08-19 DIAGNOSIS — H9190 Unspecified hearing loss, unspecified ear: Secondary | ICD-10-CM | POA: Diagnosis present

## 2013-08-19 HISTORY — PX: TOTAL KNEE ARTHROPLASTY: SHX125

## 2013-08-19 LAB — TYPE AND SCREEN
ABO/RH(D): A POS
Antibody Screen: NEGATIVE

## 2013-08-19 LAB — PROTIME-INR
INR: 1.02 (ref 0.00–1.49)
Prothrombin Time: 13.2 seconds (ref 11.6–15.2)

## 2013-08-19 SURGERY — ARTHROPLASTY, KNEE, TOTAL
Anesthesia: Spinal | Site: Knee | Laterality: Right

## 2013-08-19 MED ORDER — DEXTROSE-NACL 5-0.9 % IV SOLN
INTRAVENOUS | Status: DC
  Administered 2013-08-19 (×2): via INTRAVENOUS

## 2013-08-19 MED ORDER — MIDAZOLAM HCL 5 MG/5ML IJ SOLN
INTRAMUSCULAR | Status: DC | PRN
  Administered 2013-08-19: 2 mg via INTRAVENOUS

## 2013-08-19 MED ORDER — TERAZOSIN HCL 2 MG PO CAPS
2.0000 mg | ORAL_CAPSULE | Freq: Every day | ORAL | Status: DC
  Administered 2013-08-19 – 2013-08-21 (×3): 2 mg via ORAL
  Filled 2013-08-19 (×4): qty 1

## 2013-08-19 MED ORDER — PROPOFOL INFUSION 10 MG/ML OPTIME
INTRAVENOUS | Status: DC | PRN
  Administered 2013-08-19: 75 ug/kg/min via INTRAVENOUS

## 2013-08-19 MED ORDER — METOCLOPRAMIDE HCL 5 MG/ML IJ SOLN: 5.0000 mg | Freq: Three times a day (TID) | INTRAMUSCULAR | Status: DC | PRN

## 2013-08-19 MED ORDER — ACETAMINOPHEN 10 MG/ML IV SOLN
1000.0000 mg | Freq: Once | INTRAVENOUS | Status: AC
  Administered 2013-08-19: 1000 mg via INTRAVENOUS
  Filled 2013-08-19: qty 100

## 2013-08-19 MED ORDER — TRANEXAMIC ACID 100 MG/ML IV SOLN
1000.0000 mg | INTRAVENOUS | Status: AC
  Administered 2013-08-19: 1000 mg via INTRAVENOUS
  Filled 2013-08-19: qty 10

## 2013-08-19 MED ORDER — BUPIVACAINE HCL 0.25 % IJ SOLN
INTRAMUSCULAR | Status: DC | PRN
  Administered 2013-08-19: 20 mL

## 2013-08-19 MED ORDER — METHOCARBAMOL 500 MG PO TABS
500.0000 mg | ORAL_TABLET | Freq: Four times a day (QID) | ORAL | Status: DC | PRN
  Administered 2013-08-19 – 2013-08-22 (×9): 500 mg via ORAL
  Filled 2013-08-19 (×9): qty 1

## 2013-08-19 MED ORDER — CEFAZOLIN SODIUM 1-5 GM-% IV SOLN
1.0000 g | Freq: Four times a day (QID) | INTRAVENOUS | Status: AC
  Administered 2013-08-19 (×2): 1 g via INTRAVENOUS
  Filled 2013-08-19 (×3): qty 50

## 2013-08-19 MED ORDER — MORPHINE SULFATE 2 MG/ML IJ SOLN
1.0000 mg | INTRAMUSCULAR | Status: DC | PRN
  Administered 2013-08-19 (×2): 2 mg via INTRAVENOUS
  Filled 2013-08-19 (×2): qty 1

## 2013-08-19 MED ORDER — FLEET ENEMA 7-19 GM/118ML RE ENEM: 1.0000 | ENEMA | Freq: Once | RECTAL | Status: AC | PRN

## 2013-08-19 MED ORDER — DEXAMETHASONE SODIUM PHOSPHATE 10 MG/ML IJ SOLN
INTRAMUSCULAR | Status: AC
  Filled 2013-08-19: qty 1

## 2013-08-19 MED ORDER — BISACODYL 10 MG RE SUPP: 10.0000 mg | Freq: Every day | RECTAL | Status: DC | PRN

## 2013-08-19 MED ORDER — SODIUM CHLORIDE 0.9 % IJ SOLN
INTRAMUSCULAR | Status: AC
  Filled 2013-08-19: qty 50

## 2013-08-19 MED ORDER — MIRABEGRON ER 50 MG PO TB24
50.0000 mg | ORAL_TABLET | Freq: Every day | ORAL | Status: DC
  Administered 2013-08-19 – 2013-08-21 (×3): 50 mg via ORAL
  Filled 2013-08-19 (×4): qty 1

## 2013-08-19 MED ORDER — MEPERIDINE HCL 50 MG/ML IJ SOLN: 6.2500 mg | INTRAMUSCULAR | Status: DC | PRN

## 2013-08-19 MED ORDER — SODIUM CHLORIDE 0.9 % IV SOLN: INTRAVENOUS | Status: DC

## 2013-08-19 MED ORDER — CEFAZOLIN SODIUM-DEXTROSE 2-3 GM-% IV SOLR
2.0000 g | INTRAVENOUS | Status: AC
  Administered 2013-08-19: 2 g via INTRAVENOUS

## 2013-08-19 MED ORDER — POLYETHYLENE GLYCOL 3350 17 G PO PACK: 17.0000 g | PACK | Freq: Every day | ORAL | Status: DC | PRN

## 2013-08-19 MED ORDER — DOCUSATE SODIUM 100 MG PO CAPS
100.0000 mg | ORAL_CAPSULE | Freq: Two times a day (BID) | ORAL | Status: DC
  Administered 2013-08-19 – 2013-08-22 (×7): 100 mg via ORAL

## 2013-08-19 MED ORDER — SODIUM CHLORIDE 0.9 % IJ SOLN
INTRAMUSCULAR | Status: DC | PRN
  Administered 2013-08-19: 30 mL

## 2013-08-19 MED ORDER — ENOXAPARIN SODIUM 30 MG/0.3ML ~~LOC~~ SOLN
30.0000 mg | Freq: Two times a day (BID) | SUBCUTANEOUS | Status: DC
  Administered 2013-08-20 – 2013-08-22 (×5): 30 mg via SUBCUTANEOUS
  Filled 2013-08-19 (×7): qty 0.3

## 2013-08-19 MED ORDER — RAMIPRIL 2.5 MG PO CAPS
2.5000 mg | ORAL_CAPSULE | Freq: Every morning | ORAL | Status: DC
  Administered 2013-08-19 – 2013-08-22 (×4): 2.5 mg via ORAL
  Filled 2013-08-19 (×4): qty 1

## 2013-08-19 MED ORDER — DEXAMETHASONE 6 MG PO TABS
10.0000 mg | ORAL_TABLET | Freq: Every day | ORAL | Status: AC
  Administered 2013-08-20: 10 mg via ORAL
  Filled 2013-08-19: qty 1

## 2013-08-19 MED ORDER — DIPHENHYDRAMINE HCL 12.5 MG/5ML PO ELIX: 12.5000 mg | ORAL_SOLUTION | ORAL | Status: DC | PRN

## 2013-08-19 MED ORDER — FAMOTIDINE 10 MG PO TABS
10.0000 mg | ORAL_TABLET | Freq: Every day | ORAL | Status: DC
  Administered 2013-08-19 – 2013-08-21 (×3): 10 mg via ORAL
  Filled 2013-08-19 (×5): qty 1

## 2013-08-19 MED ORDER — TRAMADOL HCL 50 MG PO TABS: 50.0000 mg | ORAL_TABLET | Freq: Four times a day (QID) | ORAL | Status: DC | PRN

## 2013-08-19 MED ORDER — ACETAMINOPHEN 500 MG PO TABS
1000.0000 mg | ORAL_TABLET | Freq: Four times a day (QID) | ORAL | Status: AC
  Administered 2013-08-19 – 2013-08-20 (×4): 1000 mg via ORAL
  Filled 2013-08-19 (×5): qty 2

## 2013-08-19 MED ORDER — DEXAMETHASONE SODIUM PHOSPHATE 10 MG/ML IJ SOLN
10.0000 mg | Freq: Once | INTRAMUSCULAR | Status: AC
  Administered 2013-08-19: 10 mg via INTRAVENOUS

## 2013-08-19 MED ORDER — FAMOTIDINE 20 MG PO TABS
20.0000 mg | ORAL_TABLET | Freq: Every day | ORAL | Status: DC
  Administered 2013-08-19 – 2013-08-22 (×4): 20 mg via ORAL
  Filled 2013-08-19 (×4): qty 1

## 2013-08-19 MED ORDER — PROPOFOL 10 MG/ML IV BOLUS
INTRAVENOUS | Status: AC
  Filled 2013-08-19: qty 20

## 2013-08-19 MED ORDER — PROMETHAZINE HCL 25 MG/ML IJ SOLN: 6.2500 mg | INTRAMUSCULAR | Status: DC | PRN

## 2013-08-19 MED ORDER — ACETAMINOPHEN 650 MG RE SUPP
650.0000 mg | Freq: Four times a day (QID) | RECTAL | Status: DC | PRN
Start: 2013-08-19 — End: 2013-08-22

## 2013-08-19 MED ORDER — BUPIVACAINE HCL (PF) 0.25 % IJ SOLN
INTRAMUSCULAR | Status: AC
  Filled 2013-08-19: qty 30

## 2013-08-19 MED ORDER — METHOCARBAMOL 1000 MG/10ML IJ SOLN
500.0000 mg | Freq: Four times a day (QID) | INTRAVENOUS | Status: DC | PRN
  Administered 2013-08-19: 500 mg via INTRAVENOUS
  Filled 2013-08-19: qty 5

## 2013-08-19 MED ORDER — ONDANSETRON HCL 4 MG PO TABS: 4.0000 mg | ORAL_TABLET | Freq: Four times a day (QID) | ORAL | Status: DC | PRN

## 2013-08-19 MED ORDER — CHLORHEXIDINE GLUCONATE 4 % EX LIQD: 60.0000 mL | Freq: Once | CUTANEOUS | Status: DC

## 2013-08-19 MED ORDER — BUPIVACAINE LIPOSOME 1.3 % IJ SUSP
20.0000 mL | Freq: Once | INTRAMUSCULAR | Status: DC
  Filled 2013-08-19: qty 20

## 2013-08-19 MED ORDER — MIDAZOLAM HCL 2 MG/2ML IJ SOLN
INTRAMUSCULAR | Status: AC
  Filled 2013-08-19: qty 2

## 2013-08-19 MED ORDER — ONDANSETRON HCL 4 MG/2ML IJ SOLN: 4.0000 mg | Freq: Four times a day (QID) | INTRAMUSCULAR | Status: DC | PRN

## 2013-08-19 MED ORDER — LACTATED RINGERS IV SOLN
INTRAVENOUS | Status: DC | PRN
  Administered 2013-08-19: 08:00:00 via INTRAVENOUS

## 2013-08-19 MED ORDER — WARFARIN - PHARMACIST DOSING INPATIENT: Freq: Every day | Status: DC

## 2013-08-19 MED ORDER — CEFAZOLIN SODIUM-DEXTROSE 2-3 GM-% IV SOLR
INTRAVENOUS | Status: AC
  Filled 2013-08-19: qty 50

## 2013-08-19 MED ORDER — DEXAMETHASONE SODIUM PHOSPHATE 10 MG/ML IJ SOLN
10.0000 mg | Freq: Every day | INTRAMUSCULAR | Status: AC
  Filled 2013-08-19: qty 1

## 2013-08-19 MED ORDER — FUROSEMIDE 40 MG PO TABS
60.0000 mg | ORAL_TABLET | Freq: Every day | ORAL | Status: DC
  Filled 2013-08-19 (×4): qty 1

## 2013-08-19 MED ORDER — MENTHOL 3 MG MT LOZG: 1.0000 | LOZENGE | OROMUCOSAL | Status: DC | PRN

## 2013-08-19 MED ORDER — FENTANYL CITRATE 0.05 MG/ML IJ SOLN
INTRAMUSCULAR | Status: AC
  Filled 2013-08-19: qty 2

## 2013-08-19 MED ORDER — ACETAMINOPHEN 325 MG PO TABS
650.0000 mg | ORAL_TABLET | Freq: Four times a day (QID) | ORAL | Status: DC | PRN
  Administered 2013-08-20: 650 mg via ORAL
  Filled 2013-08-19: qty 2

## 2013-08-19 MED ORDER — BUPIVACAINE HCL (PF) 0.75 % IJ SOLN
INTRAMUSCULAR | Status: DC | PRN
  Administered 2013-08-19: 2 mL

## 2013-08-19 MED ORDER — BUPIVACAINE LIPOSOME 1.3 % IJ SUSP
INTRAMUSCULAR | Status: DC | PRN
  Administered 2013-08-19: 20 mL

## 2013-08-19 MED ORDER — FENTANYL CITRATE 0.05 MG/ML IJ SOLN: 25.0000 ug | INTRAMUSCULAR | Status: DC | PRN

## 2013-08-19 MED ORDER — OXYCODONE HCL 5 MG PO TABS
5.0000 mg | ORAL_TABLET | ORAL | Status: DC | PRN
  Administered 2013-08-19 – 2013-08-20 (×3): 5 mg via ORAL
  Administered 2013-08-21 – 2013-08-22 (×4): 10 mg via ORAL
  Filled 2013-08-19: qty 2
  Filled 2013-08-19: qty 1
  Filled 2013-08-19 (×5): qty 2

## 2013-08-19 MED ORDER — PHENOL 1.4 % MT LIQD: 1.0000 | OROMUCOSAL | Status: DC | PRN

## 2013-08-19 MED ORDER — WARFARIN SODIUM 5 MG PO TABS
5.0000 mg | ORAL_TABLET | Freq: Once | ORAL | Status: AC
  Administered 2013-08-19: 5 mg via ORAL
  Filled 2013-08-19 (×2): qty 1

## 2013-08-19 MED ORDER — METOCLOPRAMIDE HCL 10 MG PO TABS: 5.0000 mg | ORAL_TABLET | Freq: Three times a day (TID) | ORAL | Status: DC | PRN

## 2013-08-19 SURGICAL SUPPLY — 57 items
BAG ZIPLOCK 12X15 (MISCELLANEOUS) ×3 IMPLANT
BANDAGE ELASTIC 6 VELCRO ST LF (GAUZE/BANDAGES/DRESSINGS) ×3 IMPLANT
BANDAGE ESMARK 6X9 LF (GAUZE/BANDAGES/DRESSINGS) ×1 IMPLANT
BLADE SAG 18X100X1.27 (BLADE) ×3 IMPLANT
BLADE SAW SGTL 11.0X1.19X90.0M (BLADE) ×3 IMPLANT
BNDG ESMARK 6X9 LF (GAUZE/BANDAGES/DRESSINGS) ×3
BOWL SMART MIX CTS (DISPOSABLE) ×3 IMPLANT
CAPT RP KNEE ×3 IMPLANT
CEMENT HV SMART SET (Cement) ×6 IMPLANT
CLOSURE WOUND 1/2 X4 (GAUZE/BANDAGES/DRESSINGS) ×1
CUFF TOURN SGL QUICK 34 (TOURNIQUET CUFF) ×2
CUFF TRNQT CYL 34X4X40X1 (TOURNIQUET CUFF) ×1 IMPLANT
DECANTER SPIKE VIAL GLASS SM (MISCELLANEOUS) ×3 IMPLANT
DRAPE EXTREMITY T 121X128X90 (DRAPE) ×3 IMPLANT
DRAPE POUCH INSTRU U-SHP 10X18 (DRAPES) ×3 IMPLANT
DRAPE U-SHAPE 47X51 STRL (DRAPES) ×3 IMPLANT
DRSG ADAPTIC 3X8 NADH LF (GAUZE/BANDAGES/DRESSINGS) ×3 IMPLANT
DRSG PAD ABDOMINAL 8X10 ST (GAUZE/BANDAGES/DRESSINGS) IMPLANT
DURAPREP 26ML APPLICATOR (WOUND CARE) ×3 IMPLANT
ELECT REM PT RETURN 9FT ADLT (ELECTROSURGICAL) ×3
ELECTRODE REM PT RTRN 9FT ADLT (ELECTROSURGICAL) ×1 IMPLANT
EVACUATOR 1/8 PVC DRAIN (DRAIN) ×3 IMPLANT
FACESHIELD WRAPAROUND (MASK) ×15 IMPLANT
GLOVE BIO SURGEON STRL SZ7.5 (GLOVE) IMPLANT
GLOVE BIO SURGEON STRL SZ8 (GLOVE) ×3 IMPLANT
GLOVE BIOGEL PI IND STRL 8 (GLOVE) ×2 IMPLANT
GLOVE BIOGEL PI INDICATOR 8 (GLOVE) ×4
GLOVE SURG SS PI 6.5 STRL IVOR (GLOVE) IMPLANT
GOWN STRL REUS W/TWL LRG LVL3 (GOWN DISPOSABLE) ×3 IMPLANT
GOWN STRL REUS W/TWL XL LVL3 (GOWN DISPOSABLE) IMPLANT
HANDPIECE INTERPULSE COAX TIP (DISPOSABLE) ×2
IMMOBILIZER KNEE 20 (SOFTGOODS) ×3 IMPLANT
KIT BASIN OR (CUSTOM PROCEDURE TRAY) ×3 IMPLANT
MANIFOLD NEPTUNE II (INSTRUMENTS) ×3 IMPLANT
NDL SAFETY ECLIPSE 18X1.5 (NEEDLE) ×2 IMPLANT
NEEDLE HYPO 18GX1.5 SHARP (NEEDLE) ×4
NS IRRIG 1000ML POUR BTL (IV SOLUTION) ×3 IMPLANT
PACK TOTAL JOINT (CUSTOM PROCEDURE TRAY) ×3 IMPLANT
PAD ABD 8X10 STRL (GAUZE/BANDAGES/DRESSINGS) ×3 IMPLANT
PADDING CAST COTTON 6X4 STRL (CAST SUPPLIES) ×3 IMPLANT
POSITIONER SURGICAL ARM (MISCELLANEOUS) ×3 IMPLANT
SET HNDPC FAN SPRY TIP SCT (DISPOSABLE) ×1 IMPLANT
SPONGE GAUZE 4X4 12PLY (GAUZE/BANDAGES/DRESSINGS) ×3 IMPLANT
STRIP CLOSURE SKIN 1/2X4 (GAUZE/BANDAGES/DRESSINGS) ×2 IMPLANT
SUCTION FRAZIER 12FR DISP (SUCTIONS) ×3 IMPLANT
SUT MNCRL AB 4-0 PS2 18 (SUTURE) ×3 IMPLANT
SUT VIC AB 2-0 CT1 27 (SUTURE) ×6
SUT VIC AB 2-0 CT1 TAPERPNT 27 (SUTURE) ×3 IMPLANT
SUT VLOC 180 0 24IN GS25 (SUTURE) ×3 IMPLANT
SYR 20CC LL (SYRINGE) ×3 IMPLANT
SYR 50ML LL SCALE MARK (SYRINGE) ×3 IMPLANT
TOWEL OR 17X26 10 PK STRL BLUE (TOWEL DISPOSABLE) ×3 IMPLANT
TOWEL OR NON WOVEN STRL DISP B (DISPOSABLE) IMPLANT
TRAY FOLEY CATH 14FRSI W/METER (CATHETERS) IMPLANT
TRAY FOLEY CATH 16FRSI W/METER (SET/KITS/TRAYS/PACK) ×3 IMPLANT
WATER STERILE IRR 1500ML POUR (IV SOLUTION) ×3 IMPLANT
WRAP KNEE MAXI GEL POST OP (GAUZE/BANDAGES/DRESSINGS) ×3 IMPLANT

## 2013-08-19 NOTE — Op Note (Signed)
Pre-operative diagnosis- Osteoarthritis  Right knee(s)  Post-operative diagnosis- Osteoarthritis Right knee(s)  Procedure-  Right  Total Knee Arthroplasty  Surgeon- Dione Plover. Nami Strawder, MD  Assistant- Arlee Muslim, PA-C   Anesthesia-  Spinal  EBL-* No blood loss amount entered *   Drains Hemovac  Tourniquet time-  Total Tourniquet Time Documented: Thigh (Right) - 32 minutes Total: Thigh (Right) - 32 minutes     Complications- None  Condition-PACU - hemodynamically stable.   Brief Clinical Note  Gregory Werner is a 80 y.o. year old male with end stage OA of his right knee with progressively worsening pain and dysfunction. He has constant pain, with activity and at rest and significant functional deficits with difficulties even with ADLs. He has had extensive non-op management including analgesics, injections of cortisone, and home exercise program, but remains in significant pain with significant dysfunction. Radiographs show tricompartmental bone on bone arthritis and clinically he has a 20 degree flexion contracture. He presents now for right Total Knee Arthroplasty.    Procedure in detail---   The patient is brought into the operating room and positioned supine on the operating table. After successful administration of  Spinal,   a tourniquet is placed high on the  Right thigh(s) and the lower extremity is prepped and draped in the usual sterile fashion. Time out is performed by the operating team and then the  Right lower extremity is wrapped in Esmarch, knee flexed and the tourniquet inflated to 300 mmHg.       A midline incision is made with a ten blade through the subcutaneous tissue to the level of the extensor mechanism. A fresh blade is used to make a medial parapatellar arthrotomy. Soft tissue over the proximal medial tibia is subperiosteally elevated to the joint line with a knife and into the semimembranosus bursa with a Cobb elevator. Soft tissue over the proximal lateral  tibia is elevated with attention being paid to avoiding the patellar tendon on the tibial tubercle. The patella is everted, knee flexed 90 degrees and the ACL and PCL are removed. Findings are bone on bone all 3 compartments with massive global osteophytes.        The drill is used to create a starting hole in the distal femur and the canal is thoroughly irrigated with sterile saline to remove the fatty contents. The 5 degree Right  valgus alignment guide is placed into the femoral canal and the distal femoral cutting block is pinned to remove 12 mm off the distal femur. Resection is made with an oscillating saw.      The tibia is subluxed forward and the menisci are removed. The extramedullary alignment guide is placed referencing proximally at the medial aspect of the tibial tubercle and distally along the second metatarsal axis and tibial crest. The block is pinned to remove 24mm off the more deficient medial  side. Resection is made with an oscillating saw. Size 4is the most appropriate size for the tibia and the proximal tibia is prepared with the modular drill and keel punch for that size.      The femoral sizing guide is placed and size 4 is most appropriate. Rotation is marked off the epicondylar axis and confirmed by creating a rectangular flexion gap at 90 degrees. The size 4 cutting block is pinned in this rotation and the anterior, posterior and chamfer cuts are made with the oscillating saw. The intercondylar block is then placed and that cut is made.  Trial size 4 tibial component, trial size 4 posterior stabilized femur and a 12.5  mm posterior stabilized rotating platform insert trial is placed. Full extension is achieved with excellent varus/valgus and anterior/posterior balance throughout full range of motion. The patella is everted and thickness measured to be 25  mm. Free hand resection is taken to 15 mm, a 38 template is placed, lug holes are drilled, trial patella is placed, and it  tracks normally. Osteophytes are removed off the posterior femur with the trial in place. All trials are removed and the cut bone surfaces prepared with pulsatile lavage. Cement is mixed and once ready for implantation, the size 4 tibial implant, size  4 posterior stabilized femoral component, and the size 38 patella are cemented in place and the patella is held with the clamp. The trial insert is placed and the knee held in full extension. The Exparel (20 ml mixed with 30 ml saline) and .25% Bupivicaine, are injected into the extensor mechanism, posterior capsule, medial and lateral gutters and subcutaneous tissues.  All extruded cement is removed and once the cement is hard the permanent 12.5 mm posterior stabilized rotating platform insert is placed into the tibial tray.      The wound is copiously irrigated with saline solution and the extensor mechanism closed over a hemovac drain with #1 V-loc suture. The tourniquet is released for a total tourniquet time of 32  minutes. Flexion against gravity is 140 degrees and the patella tracks normally. Subcutaneous tissue is closed with 2.0 vicryl and subcuticular with running 4.0 Monocryl. The incision is cleaned and dried and steri-strips and a bulky sterile dressing are applied. The limb is placed into a knee immobilizer and the patient is awakened and transported to recovery in stable condition.      Please note that a surgical assistant was a medical necessity for this procedure in order to perform it in a safe and expeditious manner. Surgical assistant was necessary to retract the ligaments and vital neurovascular structures to prevent injury to them and also necessary for proper positioning of the limb to allow for anatomic placement of the prosthesis.   Dione Plover Gregory Beery, MD    08/19/2013, 9:07 AM

## 2013-08-19 NOTE — H&P (View-Only) (Signed)
Gregory Werner DOB: 1924-12-24 Married / Language: English / Race: White Male  Date of Admission:  08-19-2013  Chief Complaint:  Right Knee Pain  History of Present Illness The patient is a 80 year old male who comes in for a preoperative History and Physical. The patient is scheduled for a right total knee arthroplasty to be performed by Dr. Dione Werner. Aluisio, MD at [facility] on 08-19-2013. The patient is a 80 year old male who presents today for follow up of their knee. The patient is being followed for their right knee pain. Symptoms reported today include: pain, aching, throbbing, catching and giving way. The patient feels that they are doing poorly and report their pain level to be moderate to severe. The patient indicates that they have questions or concerns today regarding pain and activity. He is now ready to proceed with the knee replacement. They have been treated conservatively in the past for the above stated problem and despite conservative measures, they continue to have progressive pain and severe functional limitations and dysfunction. They have failed non-operative management including home exercise, medications, and injections. It is felt that they would benefit from undergoing total joint replacement. Risks and benefits of the procedure have been discussed with the patient and they elect to proceed with surgery. There are no active contraindications to surgery such as ongoing infection or rapidly progressive neurological disease.  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 mother Heart Disease. Father. father    Social History Illicit drug use. no Exercise. does other Drug/Alcohol Rehab (Previously). no Number of flights of stairs before winded. less than 1 Marital status. married Living situation. live with spouse Current work status. working part time Children. 3 Alcohol use. current drinker; drinks beer; 5-7 per week Tobacco use. Never smoker. former smoker; smoke(d) less than 1/2 pack(s) per day Tobacco / smoke exposure. no Pain Contract. no Post-Surgical Plans. Plan is to look into [facility]. Advance Directives. Living Will, Healthcare POA    Medication History Warfarin Sodium ( Oral) Specific dose unknown - Active. Ramipril (2.5MG  Capsule, Oral) Active. Terazosin HCl (2MG  Capsule, Oral) Active. Ibandronate Sodium (150MG  Tablet, Oral) Active. Myrbetriq (50MG  Tablet ER 24HR, Oral) Active. Furosemide (40MG  Tablet, Oral) Active. CeleBREX (200MG  Capsule, Oral) Active. Fish Oil ( Oral) Specific dose unknown - Active. Glucosamine Chondr 1500 Complx ( Oral) Active. Calcium Citrate (1 (one) Oral) Specific dose unknown - Active. Vitamin C CR ( Oral) Specific dose unknown - Active. Vitamin B12 ( Oral) Specific dose unknown - Active. Famotidine (20MG  Tablet, Oral) Active. Acetaminophen (500MG  Tablet, Oral) Active.    Past Surgical History Straighten Nasal Septum Cataract Surgery. bilateral Tonsillectomy Vasectomy Total Knee Replacement. left   Review of Systems General:Not Present- Chills, Fever, Night Sweats, Fatigue, Weight Gain, Weight Loss and Memory Loss. Skin:Not Present- Hives, Itching, Rash, Eczema and Lesions. HEENT:Not Present- Tinnitus, Headache, Double Vision, Visual Loss, Hearing Loss and  Dentures. Respiratory:Not Present- Shortness of breath with exertion, Shortness of breath at rest, Allergies, Coughing up blood and Chronic Cough. Cardiovascular:Not Present- Chest Pain, Racing/skipping  heartbeats, Difficulty Breathing Lying Down, Murmur, Swelling and Palpitations. Gastrointestinal:Not Present- Bloody Stool, Heartburn, Abdominal Pain, Vomiting, Nausea, Constipation, Diarrhea, Difficulty Swallowing, Jaundice and Loss of appetitie. Male Genitourinary:Not Present- Urinary frequency, Blood in Urine, Weak urinary stream, Discharge, Flank Pain, Incontinence, Painful Urination, Urgency, Urinary Retention and Urinating at Night. Musculoskeletal:Present- Joint Pain and Morning Stiffness. Not Present- Muscle Weakness, Muscle Pain, Joint Swelling, Back Pain and Spasms. Neurological:Not Present- Tremor, Dizziness, Blackout spells, Paralysis, Difficulty with balance and Weakness. Psychiatric:Not Present- Insomnia.    Vitals Weight: 150 lb Height: 70 in Weight was reported by patient. Body Surface Area: 1.83 m Body Mass Index: 21.52 kg/m Pulse: 60 (Regular) Resp.: 14 (Unlabored) BP: 146/70 (Sitting, Right Arm, Standard)     Physical Exam The physical exam findings are as follows:   General Mental Status - Alert, cooperative and good historian. General Appearance- pleasant. Not in acute distress. Orientation- Oriented X3. Build & Nutrition- Cachectic (slightly), Lean and Well developed.   Head and Neck Head- normocephalic, atraumatic . Neck Global Assessment- supple. no bruit auscultated on the right and no bruit auscultated on the left.   Note: Bilateral hearing aids.  Eye Vision- Wears corrective lenses. Pupil- Bilateral- Regular and Round. Motion- Bilateral- EOMI.   Chest and Lung Exam Auscultation: Breath sounds:- clear at anterior chest wall and - clear at posterior chest wall. Adventitious sounds:- No Adventitious  sounds.   Cardiovascular Auscultation:Rhythm- Regular rate and rhythm. Heart Sounds- S1 WNL and S2 WNL. Murmurs & Other Heart Sounds:Auscultation of the heart reveals - No Murmurs.   Abdomen Palpation/Percussion:Tenderness- Abdomen is non-tender to palpation. Rigidity (guarding)- Abdomen is soft. Auscultation:Auscultation of the abdomen reveals - Bowel sounds normal.   Male Genitourinary Not done, not pertinent to present illness  Musculoskeletal  He is a well developed male. He is alert and oriented. No apparent distress. The hips both show a normal range of motion without pain on range of motion. He is very tender over the right greater trochanter and palpation there reproduces his pain. He does not have any tenderness along the left hip. The left knee range is about 5-120 with no tenderness or instability. The right knee range is 15-115 with marked crepitus on range of motion. There is tenderness medial greater than lateral with no instability noted. Pulse, sensation and motor are intact both lower extremities.  RADIOGRAPHS: AP pelvis and lateral of the right hip show no arthritis, acute or chronic bony abnormalities. AP both knees and lateral at the right show the left knee has kicked into varus. This is a left knee replacement. The joint space medially is more narrow than laterally, meaning he has eccentric polyethylene wear. His right knee shows a severe tricompartmental bone on bone arthritis with bone on bone in all three compartments and massive osteophyte formation.   Assessment & Plan Osteoarthritis, Knee (715.96) Impression: Right Knee  Note: Plan is for a Right Total Knee Replacement by Dr. Wynelle Link.  Plan is to go to [facility] again following the surgery.  PCP - Dr. Thressa Sheller Cardiology - Dr. Percival Spanish  The patient does not have any contraindications and will receive TXA (tranexamic acid) prior to surgery.  Please note that the patient is on  warfarin for A.Fib and will stop this medication five days prior to surgery. He will be plaed back onto the warfarin postoperatively along with a lovenox bridge until he is therapeutic with the warfarin.  Signed electronically by Joelene Millin, III PA-C

## 2013-08-19 NOTE — Transfer of Care (Signed)
Immediate Anesthesia Transfer of Care Note  Patient: Gregory Werner  Procedure(s) Performed: Procedure(s): RIGHT TOTAL KNEE ARTHROPLASTY (Right)  Patient Location: PACU  Anesthesia Type:Spinal  Level of Consciousness: awake and alert   Airway & Oxygen Therapy: Patient Spontanous Breathing and Patient connected to face mask oxygen  Post-op Assessment: Report given to PACU RN and Post -op Vital signs reviewed and stable  Post vital signs: Reviewed and stable  Complications: No apparent anesthesia complications

## 2013-08-19 NOTE — Anesthesia Preprocedure Evaluation (Signed)
Anesthesia Evaluation  Patient identified by MRN, date of birth, ID band Patient awake    Reviewed: Allergy & Precautions, H&P , NPO status , Patient's Chart, lab work & pertinent test results  Airway Mallampati: III TM Distance: >3 FB Neck ROM: Full  Mouth opening: Limited Mouth Opening  Dental no notable dental hx.    Pulmonary former smoker,  breath sounds clear to auscultation  Pulmonary exam normal       Cardiovascular hypertension, Pt. on medications + dysrhythmias Atrial Fibrillation Rhythm:Regular Rate:Normal     Neuro/Psych negative neurological ROS  negative psych ROS   GI/Hepatic negative GI ROS, Neg liver ROS,   Endo/Other  negative endocrine ROS  Renal/GU negative Renal ROS  negative genitourinary   Musculoskeletal negative musculoskeletal ROS (+)   Abdominal   Peds negative pediatric ROS (+)  Hematology negative hematology ROS (+)   Anesthesia Other Findings   Reproductive/Obstetrics negative OB ROS                           Anesthesia Physical Anesthesia Plan  ASA: III  Anesthesia Plan: Spinal   Post-op Pain Management:    Induction: Intravenous  Airway Management Planned: Simple Face Mask  Additional Equipment:   Intra-op Plan:   Post-operative Plan:   Informed Consent: I have reviewed the patients History and Physical, chart, labs and discussed the procedure including the risks, benefits and alternatives for the proposed anesthesia with the patient or authorized representative who has indicated his/her understanding and acceptance.   Dental advisory given  Plan Discussed with: CRNA  Anesthesia Plan Comments:         Anesthesia Quick Evaluation

## 2013-08-19 NOTE — Evaluation (Signed)
Physical Therapy Evaluation Patient Details Name: Gregory Werner MRN: [id number] DOB: July 28, 1924 Today's Date: 08/19/2013   History of Present Illness  s/p  R TKA PMHx: afib, Spinal stenosis, HTN, L TKA  Clinical Impression  Pt will benefit from PT to address deficits below    Follow Up Recommendations SNF    Equipment Recommendations  None recommended by PT    Recommendations for Other Services       Precautions / Restrictions Precautions Precautions: Knee Required Braces or Orthoses: Knee Immobilizer - Left Knee Immobilizer - Left: Discontinue once straight leg raise with < 10 degree lag      Mobility  Bed Mobility Overal bed mobility: Needs Assistance Bed Mobility: Supine to Sit     Supine to sit: Mod assist     General bed mobility comments: incr time, cues for technique  Transfers Overall transfer level: Needs assistance Equipment used: Rolling walker (2 wheeled) Transfers: Sit to/from Stand Sit to Stand: Mod assist         General transfer comment: cues for hand placement, RLE position and wt shift  Ambulation/Gait Ambulation/Gait assistance: Mod assist Ambulation Distance (Feet): 7 Feet Assistive device: Rolling walker (2 wheeled) Gait Pattern/deviations: Antalgic;Narrow base of support;Trunk flexed;Step-to pattern     General Gait Details: cues for sequence, posture, pt places very little wt on RLE  Stairs            Wheelchair Mobility    Modified Rankin (Stroke Patients Only)       Balance Overall balance assessment: Needs assistance           Standing balance-Leahy Scale: Zero                               Pertinent Vitals/Pain BP 135/77 sats 96% HR 80s    Home Living Family/patient expects to be discharged to:: Skilled nursing facility                      Prior Function Level of Independence: Independent with assistive device(s);Independent         Comments: uses cane when going out,  first thing in the am-quad cane in the house     Hand Dominance        Extremity/Trunk Assessment   Upper Extremity Assessment: Defer to OT evaluation           Lower Extremity Assessment: RLE deficits/detail RLE Deficits / Details: able to assist with SLR, knee ROM grossly 25-40*flexion; pt had difficulty with knee ext prior to surgery       Communication   Communication: No difficulties  Cognition Arousal/Alertness: Awake/alert Behavior During Therapy: WFL for tasks assessed/performed Overall Cognitive Status: Within Functional Limits for tasks assessed                      General Comments      Exercises Total Joint Exercises Ankle Circles/Pumps: AROM;Both;5 reps Quad Sets: 5 reps;Both;AROM      Assessment/Plan    PT Assessment Patient needs continued PT services  PT Diagnosis Difficulty walking   PT Problem List Decreased strength;Decreased range of motion;Decreased activity tolerance;Decreased balance;Decreased mobility;Decreased knowledge of use of DME  PT Treatment Interventions DME instruction;Gait training;Functional mobility training;Therapeutic activities;Therapeutic exercise;Patient/family education   PT Goals (Current goals can be found in the Care Plan section) Acute Rehab PT Goals Patient Stated Goal: go to rehab PT Goal Formulation: With patient  Time For Goal Achievement: 08/26/13 Potential to Achieve Goals: Good    Frequency 7X/week   Barriers to discharge        Co-evaluation               End of Session Equipment Utilized During Treatment: Gait belt Activity Tolerance: Patient limited by fatigue;Treatment limited secondary to medical complications (Comment) (dizzy) Patient left: in chair;with call bell/phone within reach;with family/visitor present Nurse Communication: Mobility status         Time: 3546-5681 PT Time Calculation (min): 21 min   Charges:   PT Evaluation $Initial PT Evaluation Tier I: 1  Procedure PT Treatments $Gait Training: 8-22 mins   PT G Codes:          Neil Crouch 08/19/2013, 4:42 PM

## 2013-08-19 NOTE — Progress Notes (Signed)
Clinical Social Work Department BRIEF PSYCHOSOCIAL ASSESSMENT 08/19/2013  Patient:  Gregory Gregory, Gregory Gregory     Account Number:  [id number]     Admit date:  08/19/2013  Clinical Social Worker:  Lacie Scotts  Date/Time:  08/19/2013 01:26 PM  Referred by:  Physician  Date Referred:  08/19/2013 Referred for  SNF Placement   Other Referral:   Interview type:  Patient Other interview type:    PSYCHOSOCIAL DATA Living Status:  WIFE Admitted from facility:   Level of care:   Primary support name:  [place] Primary support relationship to patient:  SPOUSE Degree of support available:   supportive    CURRENT CONCERNS Current Concerns  Post-Acute Placement   Other Concerns:    SOCIAL WORK ASSESSMENT / PLAN Pt is an 80 yr old gentleman living at home prior to hospitalization. CSW met with pt to assist with d/c planning. This is a planned admission. Pt has made prior arrangements to have ST rehab at [facility] following hospital d/c. CSW has contacted SNF and d/c plans have been confirmed. CSW will continue to follow to assist with d/c planning to SNF.   Assessment/plan status:  Psychosocial Support/Ongoing Assessment of Needs Other assessment/ plan:   Information/referral to community resources:   Insurance coverage for SNF and ambulance transport reviewed.    PATIENT'S/FAMILY'S RESPONSE TO PLAN OF CARE: Pt states he feels pretty good for just having surgery. He is motivated to begin therapy and is looking forward to having rehab at [facility].   Gregory Lean LCSW [phone]

## 2013-08-19 NOTE — Interval H&P Note (Signed)
History and Physical Interval Note:  08/19/2013 7:11 AM  Gregory Werner  has presented today for surgery, with the diagnosis of OA OF RIGHT KNEE  The various methods of treatment have been discussed with the patient and family. After consideration of risks, benefits and other options for treatment, the patient has consented to  Procedure(s): RIGHT TOTAL KNEE ARTHROPLASTY (Right) as a surgical intervention .  The patient's history has been reviewed, patient examined, no change in status, stable for surgery.  I have reviewed the patient's chart and labs.  Questions were answered to the patient's satisfaction.     Dione Plover Carron Jaggi

## 2013-08-19 NOTE — Anesthesia Procedure Notes (Signed)
Spinal  Start time: 08/19/2013 8:16 AM Staffing Anesthesiologist: Montez Hageman CRNA/Resident: [place] Finleigh Cheong C Performed by: resident/CRNA  Preanesthetic Checklist Completed: patient identified, site marked, surgical consent, pre-op evaluation, timeout performed, IV checked, risks and benefits discussed and monitors and equipment checked Spinal Block Patient position: sitting Prep: Betadine Patient monitoring: heart rate, cardiac monitor, continuous pulse ox and blood pressure Approach: right paramedian Location: L2-3 Injection technique: single-shot Needle Needle gauge: 22 G

## 2013-08-19 NOTE — Progress Notes (Signed)
Clinical Social Work Department CLINICAL SOCIAL WORK PLACEMENT NOTE 08/19/2013  Patient:  Gregory Werner, Gregory Werner  Account Number:  [id number] Admit date:  08/19/2013  Clinical Social Worker:  [name], LCSW  Date/time:  08/19/2013 01:43 PM  Clinical Social Work is seeking post-discharge placement for this patient at the following level of care:   SKILLED NURSING   (*CSW will update this form in Epic as items are completed)     Patient/family provided with [place] Department of Clinical Social Work's list of facilities offering this level of care within the geographic area requested by the patient (or if unable, by the patient's family).  08/19/2013  Patient/family informed of their freedom to choose among providers that offer the needed level of care, that participate in Medicare, Medicaid or managed care program needed by the patient, have an available bed and are willing to accept the patient.    Patient/family informed of [facility]' ownership interest in [facility], as well as of the fact that they are under no obligation to receive care at this facility.  PASARR submitted to EDS on 08/19/2013 PASARR number received from EDS on 08/19/2013  FL2 transmitted to all facilities in geographic area requested by pt/family on  08/19/2013 FL2 transmitted to all facilities within larger geographic area on   Patient informed that his/her managed care company has contracts with or will negotiate with  certain facilities, including the following:     Patient/family informed of bed offers received:  08/19/2013 Patient chooses bed at [facility] Physician recommends and patient chooses bed at    Patient to be transferred to  on   Patient to be transferred to facility by   The following physician request were entered in Epic:   Additional Comments:  [name] LCSW [phone]

## 2013-08-19 NOTE — Transfer of Care (Signed)
Immediate Anesthesia Transfer of Care Note  Patient: Gregory Werner  Procedure(s) Performed: Procedure(s): RIGHT TOTAL KNEE ARTHROPLASTY (Right)  Patient Location: PACU  Anesthesia Type:Spinal  Level of Consciousness: awake and alert   Airway & Oxygen Therapy: Patient Spontanous Breathing and Patient connected to face mask oxygen  Post-op Assessment: Report given to PACU RN and Post -op Vital signs reviewed and stable  Post vital signs: Reviewed and stable  Complications: No apparent anesthesia complications 

## 2013-08-19 NOTE — Progress Notes (Signed)
ANTICOAGULATION CONSULT NOTE - Initial Consult  Pharmacy Consult for Warfarin Indication: atrial fibrillation and VTE prophylaxis  No Known Allergies  Patient Measurements: Height: 5\' 10"  (177.8 cm) Weight: 155 lb (70.308 kg) IBW/kg (Calculated) : 73  Vital Signs: Temp: 98.1 F (36.7 C) (05/18 1425) Temp src: Oral (05/18 1425) BP: 136/74 mmHg (05/18 1425) Pulse Rate: 77 (05/18 1425)  Labs:  Recent Labs  08/19/13 0630  LABPROT 13.2  INR 1.02    Estimated Creatinine Clearance: 49.7 ml/min (by C-G formula based on Cr of 1.06).   Medical History: Past Medical History  Diagnosis Date  . Atrial fibrillation   . Hypertension     benign  . Hyperlipidemia   . GERD (gastroesophageal reflux disease)   . Lung infiltrate 05/2010    left lower lobe infiltrate, questionable pneumonia  . Dysphagia 2001    history s/p esophageal dilatation  . Diverticulosis     history  . Urinary frequency     history  . Arthritis   . Lower leg edema     bilateral   . Hearing loss     bilateral hearing aids  . Dysrhythmia     hx. Atrial Fibrillation, RBBB  . Prostate cancer 2001    treated with radiation  . Prostate cancer   . Skin cancer     Medications:  Scheduled:  . acetaminophen  1,000 mg Oral 4 times per day  .  ceFAZolin (ANCEF) IV  1 g Intravenous Q6H  . [START ON 08/20/2013] dexamethasone  10 mg Oral Daily   Or  . [START ON 08/20/2013] dexamethasone  10 mg Intravenous Daily  . docusate sodium  100 mg Oral BID  . [START ON 08/20/2013] enoxaparin (LOVENOX) injection  30 mg Subcutaneous Q12H  . famotidine  10 mg Oral QHS  . famotidine  20 mg Oral Daily  . furosemide  60 mg Oral Daily  . mirabegron ER  50 mg Oral q1800  . ramipril  2.5 mg Oral q morning - 10a  . terazosin  2 mg Oral QHS  . warfarin  5 mg Oral ONCE-1800  . [START ON 08/20/2013] Warfarin - Pharmacist Dosing Inpatient   Does not apply q1800   Infusions:  . dextrose 5 % and 0.9% NaCl 75 mL/hr at 08/19/13 1309    PRN: acetaminophen, acetaminophen, bisacodyl, diphenhydrAMINE, menthol-cetylpyridinium, methocarbamol (ROBAXIN) IV, methocarbamol, metoCLOPramide (REGLAN) injection, metoCLOPramide, morphine injection, ondansetron (ZOFRAN) IV, ondansetron, oxyCODONE, phenol, polyethylene glycol, sodium phosphate, traMADol  Assessment: 88yom s/p R TKA. On warfarin PTA for afib. Home dose : 5mg  on Sunday, Tuesday and Thursday. 2.5mg  on Monday, Wednesday and Friday and Saturday. Pharmacy consulted to dose warfarin for VTE prophylaxis/ afib.  Goal of Therapy:  INR 2-3   Plan:  Give warfarin 5mg  dose x1 tonight F/u INR   Kizzie Furnish, PharmD Pager: [phone] 08/19/2013 2:53 PM

## 2013-08-19 NOTE — Progress Notes (Signed)
Utilization review completed.  

## 2013-08-19 NOTE — Anesthesia Postprocedure Evaluation (Signed)
  Anesthesia Post-op Note  Patient: Gregory Werner  Procedure(s) Performed: Procedure(s) (LRB): RIGHT TOTAL KNEE ARTHROPLASTY (Right)  Patient Location: PACU  Anesthesia Type: Spinal  Level of Consciousness: awake and alert   Airway and Oxygen Therapy: Patient Spontanous Breathing  Post-op Pain: mild  Post-op Assessment: Post-op Vital signs reviewed, Patient's Cardiovascular Status Stable, Respiratory Function Stable, Patent Airway and No signs of Nausea or vomiting  Last Vitals:  Filed Vitals:   08/19/13 1115  BP: 160/78  Pulse: 56  Temp: 36.4 C  Resp: 20    Post-op Vital Signs: stable   Complications: No apparent anesthesia complications

## 2013-08-20 DIAGNOSIS — E871 Hypo-osmolality and hyponatremia: Secondary | ICD-10-CM | POA: Diagnosis present

## 2013-08-20 LAB — CBC
HCT: 25.9 % — ABNORMAL LOW (ref 39.0–52.0)
Hemoglobin: 8.7 g/dL — ABNORMAL LOW (ref 13.0–17.0)
MCH: 32.6 pg (ref 26.0–34.0)
MCHC: 33.6 g/dL (ref 30.0–36.0)
MCV: 97 fL (ref 78.0–100.0)
Platelets: 171 10*3/uL (ref 150–400)
RBC: 2.67 MIL/uL — AB (ref 4.22–5.81)
RDW: 13.2 % (ref 11.5–15.5)
WBC: 11 10*3/uL — ABNORMAL HIGH (ref 4.0–10.5)

## 2013-08-20 LAB — BASIC METABOLIC PANEL
BUN: 20 mg/dL (ref 6–23)
CO2: 24 meq/L (ref 19–32)
Calcium: 8.7 mg/dL (ref 8.4–10.5)
Chloride: 102 mEq/L (ref 96–112)
Creatinine, Ser: 0.73 mg/dL (ref 0.50–1.35)
GFR calc Af Amer: >90 mL/min (ref >90)
GFR, EST NON AFRICAN AMERICAN: 82 mL/min — AB (ref >90)
Glucose, Bld: 131 mg/dL — ABNORMAL HIGH (ref 70–99)
POTASSIUM: 4.4 meq/L (ref 3.7–5.3)
SODIUM: 136 meq/L — AB (ref 137–147)

## 2013-08-20 LAB — PROTIME-INR
INR: 1.11 (ref 0.00–1.49)
Prothrombin Time: 14.1 seconds (ref 11.6–15.2)

## 2013-08-20 MED ORDER — WARFARIN SODIUM 5 MG PO TABS
5.0000 mg | ORAL_TABLET | Freq: Once | ORAL | Status: AC
  Administered 2013-08-20: 5 mg via ORAL
  Filled 2013-08-20: qty 1

## 2013-08-20 NOTE — Progress Notes (Signed)
ANTICOAGULATION CONSULT NOTE - Initial Consult  Pharmacy Consult for warfarin Indication: atrial fibrillation and VTE prophylaxis  No Known Allergies  Patient Measurements: Height: 5\' 10"  (177.8 cm) Weight: 155 lb (70.308 kg) IBW/kg (Calculated) : 73  Vital Signs: Temp: 98 F (36.7 C) (05/19 0500) Temp src: Oral (05/19 0500) BP: 119/70 mmHg (05/19 0500) Pulse Rate: 70 (05/19 0500)  Labs:  Recent Labs  08/19/13 0630 08/20/13 0430  HGB  --  8.7*  HCT  --  25.9*  PLT  --  171  LABPROT 13.2 14.1  INR 1.02 1.11  CREATININE  --  0.73    Estimated Creatinine Clearance: 65.9 ml/min (by C-G formula based on Cr of 0.73).   Medical History: Past Medical History  Diagnosis Date  . Atrial fibrillation   . Hypertension     benign  . Hyperlipidemia   . GERD (gastroesophageal reflux disease)   . Lung infiltrate 05/2010    left lower lobe infiltrate, questionable pneumonia  . Dysphagia 2001    history s/p esophageal dilatation  . Diverticulosis     history  . Urinary frequency     history  . Arthritis   . Lower leg edema     bilateral   . Hearing loss     bilateral hearing aids  . Dysrhythmia     hx. Atrial Fibrillation, RBBB  . Prostate cancer 2001    treated with radiation  . Prostate cancer   . Skin cancer     Medications:  Scheduled:  . acetaminophen  1,000 mg Oral 4 times per day  . dexamethasone  10 mg Oral Daily   Or  . dexamethasone  10 mg Intravenous Daily  . docusate sodium  100 mg Oral BID  . enoxaparin (LOVENOX) injection  30 mg Subcutaneous Q12H  . famotidine  10 mg Oral QHS  . famotidine  20 mg Oral Daily  . furosemide  60 mg Oral Daily  . mirabegron ER  50 mg Oral q1800  . ramipril  2.5 mg Oral q morning - 10a  . terazosin  2 mg Oral QHS  . warfarin  5 mg Oral ONCE-1800  . Warfarin - Pharmacist Dosing Inpatient   Does not apply q1800   Infusions:  . dextrose 5 % and 0.9% NaCl 75 mL/hr at 08/19/13 2310   PRN: acetaminophen,  acetaminophen, bisacodyl, diphenhydrAMINE, menthol-cetylpyridinium, methocarbamol (ROBAXIN) IV, methocarbamol, metoCLOPramide (REGLAN) injection, metoCLOPramide, morphine injection, ondansetron (ZOFRAN) IV, ondansetron, oxyCODONE, phenol, polyethylene glycol, traMADol  Assessment: 88yom s/p R TKA On warfarin PTA for afib. Home dose : 5mg  on Sunday, Tuesday and Thursday. 2.5mg  on Monday, Wednesday and Friday and Saturday. Pharmacy consulted to dose warfarin for afib/VTE prophylaxis. 5/19 INR = 1.11 Lovenox 30mg  SQ Q12H for bridge to warfarin. Continue until INR > 2  Goal of Therapy:  INR 2-3   Plan:  Repeat Warfarin 5mg  x 1 today F/u INR  Kizzie Furnish, PharmD Pager: [phone] 08/20/2013 8:22 AM

## 2013-08-20 NOTE — Progress Notes (Signed)
IV removed as it was bleeding around site and was unable to occlude without removal of catheter. Hemostasis achieved after removal.  Pt states he does not want to be stuck again to put in another IV.

## 2013-08-20 NOTE — Progress Notes (Signed)
   Subjective: 1 Day Post-Op Procedure(s) (LRB): RIGHT TOTAL KNEE ARTHROPLASTY (Right) Patient reports pain as moderate the night of surgery but better this morning. Patient seen in rounds with Dr. Wynelle Link. Patient is well, but has had some minor complaints of pain in the knee, requiring pain medications We will start therapy today.  Plan is to go [facility] after hospital stay.  Probably Thursday.  Objective: Vital signs in last 24 hours: Temp:  [95.9 F (35.5 C)-98.4 F (36.9 C)] 98 F (36.7 C) (05/19 0500) Pulse Rate:  [51-91] 70 (05/19 0500) Resp:  [18-20] 18 (05/19 0500) BP: (119-160)/(66-83) 119/70 mmHg (05/19 0500) SpO2:  [98 %-100 %] 98 % (05/19 0500) Weight:  [70.308 kg (155 lb)] 70.308 kg (155 lb) (05/18 1219)  Intake/Output from previous day:  Intake/Output Summary (Last 24 hours) at 08/20/13 0811 Last data filed at 08/20/13 0654  Gross per 24 hour  Intake 2743.75 ml  Output   1983 ml  Net 760.75 ml     Labs:  Recent Labs  08/20/13 0430  HGB 8.7*    Recent Labs  08/20/13 0430  WBC 11.0*  RBC 2.67*  HCT 25.9*  PLT 171    Recent Labs  08/20/13 0430  NA 136*  K 4.4  CL 102  CO2 24  BUN 20  CREATININE 0.73  GLUCOSE 131*  CALCIUM 8.7    Recent Labs  08/19/13 0630 08/20/13 0430  INR 1.02 1.11    EXAM General - Patient is Alert, Appropriate and Oriented Extremity - Neurovascular intact Sensation intact distally Dressing - dressing C/D/I Motor Function - intact, moving foot and toes well on exam.  Hemovac pulled without difficulty.  Past Medical History  Diagnosis Date  . Atrial fibrillation   . Hypertension     benign  . Hyperlipidemia   . GERD (gastroesophageal reflux disease)   . Lung infiltrate 05/2010    left lower lobe infiltrate, questionable pneumonia  . Dysphagia 2001    history s/p esophageal dilatation  . Diverticulosis     history  . Urinary frequency     history  . Arthritis   . Lower leg edema    bilateral   . Hearing loss     bilateral hearing aids  . Dysrhythmia     hx. Atrial Fibrillation, RBBB  . Prostate cancer 2001    treated with radiation  . Prostate cancer   . Skin cancer     Assessment/Plan: 1 Day Post-Op Procedure(s) (LRB): RIGHT TOTAL KNEE ARTHROPLASTY (Right) Principal Problem:   OA (osteoarthritis) of knee Active Problems:   HYPERLIPIDEMIA   HYPERTENSION, BENIGN   Atrial fibrillation   GERD   Hyponatremia  Estimated body mass index is 22.24 kg/(m^2) as calculated from the following:   Height as of this encounter: 5\' 10"  (1.778 m).   Weight as of this encounter: 70.308 kg (155 lb). Advance diet Up with therapy Discharge to SNF - [facility] Continue therapy - walked 7 feet yesterday.  DVT Prophylaxis - Lovenox and Coumadin Continue Lovenox injections until the INR is therapeutic at or greater than 2.0.  When INR reaches the therapeutic level of equal to or greater than 2.0, the patient may discontinue the Lovenox injections.  Weight-Bearing as tolerated to right leg D/C O2 and Pulse OX and try on Room Air  Arlee Muslim, PA-C Orthopaedic Surgery 08/20/2013, 8:11 AM

## 2013-08-20 NOTE — Progress Notes (Signed)
OT Cancellation Note  Patient Details Name: Gregory Werner MRN: [id number] DOB: May 19, 1924   Cancelled Treatment:   Noted plan is SNF . Will defer OT eval to SNF  Betsy Pries 08/20/2013, 10:43 AM

## 2013-08-20 NOTE — Care Management Note (Signed)
    Page 1 of 1   08/20/2013     2:56:36 PM CARE MANAGEMENT NOTE 08/20/2013  Patient:  Gregory Werner, Gregory Werner   Account Number:  [id number]  Date Initiated:  08/20/2013  Documentation initiated by:  [facility]  Subjective/Objective Assessment:   adm: R knee pain; R TOTAL KNEE ARTHROPLASTY     Action/Plan:   SNF for rehab/[facility]   Anticipated DC Date:  08/21/2013   Anticipated DC Plan:  [place]  CM consult      Choice offered to / List presented to:             Status of service:  Completed, signed off Medicare Important Message given?  YES (If response is "NO", the following Medicare IM given date fields will be blank) Date Medicare IM given:  08/09/2013 Date Additional Medicare IM given:    Discharge Disposition:  [place]  Per UR Regulation:    If discussed at Long Length of Stay Meetings, dates discussed:    Comments:  08/20/13 14:54 CM reviewed; CSW arranging SNF for rehab.  IM from medicare given to patient at pre-admission.  No other CM needs were communicated.  Mariane Masters, BSN, CM [phone].

## 2013-08-20 NOTE — Progress Notes (Signed)
Physical Therapy Treatment Patient Details Name: Gregory Werner MRN: [id number] DOB: 14-Aug-1924 Today's Date: 08/20/2013    History of Present Illness s/p  R TKA PMHx: afib, Spinal stenosis, HTN, L TKA    PT Comments    POD # 1 am session.  Applied KI and instructed pt on proper use for amb.  Assisted pt OOB to amb to BR then to recliner.  Limited activity tolerance.  Performed TKR TE's then applied ICE.   Follow Up Recommendations  SNF ([facility]     Equipment Recommendations  None recommended by PT    Recommendations for Other Services       Precautions / Restrictions Precautions Precautions: Knee Required Braces or Orthoses: Knee Immobilizer - Left Knee Immobilizer - Left: Discontinue once straight leg raise with < 10 degree lag Restrictions Weight Bearing Restrictions: No    Mobility  Bed Mobility Overal bed mobility: Needs Assistance Bed Mobility: Supine to Sit     Supine to sit: Mod assist;Min assist     General bed mobility comments: incr time, cues for technique  Transfers Overall transfer level: Needs assistance Equipment used: Rolling walker (2 wheeled) Transfers: Sit to/from Stand Sit to Stand: Mod assist;Min assist         General transfer comment: cues for hand placement, RLE position and wt shift  Ambulation/Gait Ambulation/Gait assistance: Mod assist Ambulation Distance (Feet): 22 Feet Assistive device: Rolling walker (2 wheeled) Gait Pattern/deviations: Step-to pattern;Trunk flexed;Narrow base of support;Decreased stance time - right Gait velocity: decreased   General Gait Details: cues for sequence, posture, pt places very little wt on RLE   Stairs            Wheelchair Mobility    Modified Rankin (Stroke Patients Only)       Balance                                    Cognition                            Exercises   Total Knee Replacement TE's 10 reps B LE ankle pumps 10 reps towel  squeezes 10 reps knee presses 10 reps heel slides  10 reps SAQ's 10 reps SLR's 10 reps ABD Followed by ICE    General Comments        Pertinent Vitals/Pain C/o 5/10 pain ICE applied    Home Living                      Prior Function            PT Goals (current goals can now be found in the care plan section) Progress towards PT goals: Progressing toward goals    Frequency  7X/week    PT Plan      Co-evaluation             End of Session Equipment Utilized During Treatment: Gait belt Activity Tolerance: Patient limited by fatigue;Treatment limited secondary to medical complications (Comment) Patient left: in chair;with call bell/phone within reach;with family/visitor present     Time: 1000-1041 PT Time Calculation (min): 41 min  Charges:  $Gait Training: 8-22 mins $Therapeutic Exercise: 8-22 mins $Therapeutic Activity: 8-22 mins                    G Codes:  Rica Koyanagi  PTA [facility]  Acute  Rehab Pager      [phone]

## 2013-08-20 NOTE — Progress Notes (Signed)
Physical Therapy Treatment Patient Details Name: Gregory Werner MRN: [id number] DOB: 1924/11/21 Today's Date: 08/20/2013    History of Present Illness s/p  R TKA PMHx: afib, Spinal stenosis, HTN, L TKA    PT Comments    POD # 1 pm session.  Applied KI then assisted pt out of recliner to amb to BR.  Amb in hallway.  Assisted back to bed for CPM.  Follow Up Recommendations  SNF ([facility])     Equipment Recommendations  None recommended by PT    Recommendations for Other Services       Precautions / Restrictions Precautions Precautions: Knee Required Braces or Orthoses: Knee Immobilizer - Left Knee Immobilizer - Left: Discontinue once straight leg raise with < 10 degree lag Restrictions Weight Bearing Restrictions: No    Mobility  Bed Mobility Overal bed mobility: Needs Assistance Bed Mobility: Sit to Supine     Supine to sit: Mod assist;Min assist Sit to supine: Mod assist   General bed mobility comments: incr time, cues for technique  Transfers Overall transfer level: Needs assistance Equipment used: Rolling walker (2 wheeled) Transfers: Sit to/from Stand Sit to Stand: Mod assist;Min assist         General transfer comment: cues for hand placement, RLE position and wt shift  Ambulation/Gait Ambulation/Gait assistance: Mod assist;Min assist Ambulation Distance (Feet): 42 Feet Assistive device: Rolling walker (2 wheeled) Gait Pattern/deviations: Step-to pattern;Narrow base of support;Trunk flexed;Decreased stance time - right Gait velocity: decreased   General Gait Details: cues for sequence, posture   Stairs            Wheelchair Mobility    Modified Rankin (Stroke Patients Only)       Balance                                    Cognition                            Exercises      General Comments        Pertinent Vitals/Pain C/o 4/10 Pre medicated     Home Living                       Prior Function            PT Goals (current goals can now be found in the care plan section) Progress towards PT goals: Progressing toward goals    Frequency  7X/week    PT Plan      Co-evaluation             End of Session Equipment Utilized During Treatment: Gait belt Activity Tolerance: Patient limited by fatigue Patient left: in bed;with call bell/phone within reach     Time: 1330-1400 PT Time Calculation (min): 30 min  Charges:  $Gait Training: 8-22 mins $Therapeutic Activity: 8-22 mins                    G Codes:     Rica Koyanagi  PTA [facility]  Acute  Rehab Pager      [phone]

## 2013-08-21 LAB — BASIC METABOLIC PANEL
BUN: 25 mg/dL — ABNORMAL HIGH (ref 6–23)
CHLORIDE: 102 meq/L (ref 96–112)
CO2: 25 meq/L (ref 19–32)
Calcium: 8.7 mg/dL (ref 8.4–10.5)
Creatinine, Ser: 0.73 mg/dL (ref 0.50–1.35)
GFR calc non Af Amer: 82 mL/min — ABNORMAL LOW (ref >90)
Glucose, Bld: 121 mg/dL — ABNORMAL HIGH (ref 70–99)
POTASSIUM: 4.3 meq/L (ref 3.7–5.3)
SODIUM: 137 meq/L (ref 137–147)

## 2013-08-21 LAB — CBC
HCT: 24.8 % — ABNORMAL LOW (ref 39.0–52.0)
Hemoglobin: 8.2 g/dL — ABNORMAL LOW (ref 13.0–17.0)
MCH: 32.2 pg (ref 26.0–34.0)
MCHC: 33.1 g/dL (ref 30.0–36.0)
MCV: 97.3 fL (ref 78.0–100.0)
PLATELETS: 172 10*3/uL (ref 150–400)
RBC: 2.55 MIL/uL — AB (ref 4.22–5.81)
RDW: 13.5 % (ref 11.5–15.5)
WBC: 10.7 10*3/uL — AB (ref 4.0–10.5)

## 2013-08-21 LAB — PROTIME-INR
INR: 1.42 (ref 0.00–1.49)
Prothrombin Time: 17 seconds — ABNORMAL HIGH (ref 11.6–15.2)

## 2013-08-21 MED ORDER — WARFARIN SODIUM 5 MG PO TABS
5.0000 mg | ORAL_TABLET | Freq: Once | ORAL | Status: AC
  Administered 2013-08-21: 5 mg via ORAL
  Filled 2013-08-21: qty 1

## 2013-08-21 NOTE — Progress Notes (Signed)
ANTICOAGULATION CONSULT NOTE - Initial Consult  Pharmacy Consult for warfarin Indication: atrial fibrillation and VTE prophylaxis  No Known Allergies  Patient Measurements: Height: 5\' 10"  (177.8 cm) Weight: 155 lb (70.308 kg) IBW/kg (Calculated) : 73  Vital Signs: Temp: 98 F (36.7 C) (05/20 0543) Temp src: Oral (05/20 0543) BP: 138/72 mmHg (05/20 0543) Pulse Rate: 66 (05/20 0543)  Labs:  Recent Labs  08/19/13 0630 08/20/13 0430 08/21/13 0436  HGB  --  8.7* 8.2*  HCT  --  25.9* 24.8*  PLT  --  171 172  LABPROT 13.2 14.1 17.0*  INR 1.02 1.11 1.42  CREATININE  --  0.73 0.73    Estimated Creatinine Clearance: 65.9 ml/min (by C-G formula based on Cr of 0.73).   Medical History: Past Medical History  Diagnosis Date  . Atrial fibrillation   . Hypertension     benign  . Hyperlipidemia   . GERD (gastroesophageal reflux disease)   . Lung infiltrate 05/2010    left lower lobe infiltrate, questionable pneumonia  . Dysphagia 2001    history s/p esophageal dilatation  . Diverticulosis     history  . Urinary frequency     history  . Arthritis   . Lower leg edema     bilateral   . Hearing loss     bilateral hearing aids  . Dysrhythmia     hx. Atrial Fibrillation, RBBB  . Prostate cancer 2001    treated with radiation  . Prostate cancer   . Skin cancer     Medications:  Scheduled:  . docusate sodium  100 mg Oral BID  . enoxaparin (LOVENOX) injection  30 mg Subcutaneous Q12H  . famotidine  10 mg Oral QHS  . famotidine  20 mg Oral Daily  . furosemide  60 mg Oral Daily  . mirabegron ER  50 mg Oral q1800  . ramipril  2.5 mg Oral q morning - 10a  . terazosin  2 mg Oral QHS  . warfarin  5 mg Oral ONCE-1800  . Warfarin - Pharmacist Dosing Inpatient   Does not apply q1800   Infusions:  . dextrose 5 % and 0.9% NaCl 75 mL/hr at 08/19/13 2310   PRN: acetaminophen, acetaminophen, bisacodyl, diphenhydrAMINE, menthol-cetylpyridinium, methocarbamol (ROBAXIN) IV,  methocarbamol, metoCLOPramide (REGLAN) injection, metoCLOPramide, morphine injection, ondansetron (ZOFRAN) IV, ondansetron, oxyCODONE, phenol, polyethylene glycol, traMADol  Assessment: 88yom s/p R TKA On warfarin PTA for afib. Home dose : 5mg  on Sunday, Tuesday and Thursday. 2.5mg  on Monday, Wednesday and Friday and Saturday. Pharmacy consulted to dose warfarin for afib/VTE prophylaxis. 5/19 INR = 1.42 Lovenox 30mg  SQ Q12H for bridge to warfarin. Continue until INR > 2  Goal of Therapy:  INR 2-3   Plan:  Repeat Warfarin 5mg  x 1 today F/u INR  Kizzie Furnish, PharmD Pager: [phone] 08/21/2013 10:14 AM

## 2013-08-21 NOTE — Progress Notes (Signed)
Physical Therapy Treatment Patient Details Name: Gregory Werner MRN: [id number] DOB: 1924-06-03 Today's Date: 08/21/2013    History of Present Illness s/p  R TKA PMHx: afib, Spinal stenosis, HTN, L TKA    PT Comments    POD # 2 am session.  Applied KI then assisted pt OOB to amb to BR.  Amb in hallway then assisted to recliner to perform TKR TE's followed by ICE.    Follow Up Recommendations  SNF ([facility])     Equipment Recommendations       Recommendations for Other Services       Precautions / Restrictions Precautions Precautions: Knee Precaution Comments: Instructed pt on KI use for amb Required Braces or Orthoses: Knee Immobilizer - Left Knee Immobilizer - Left: Discontinue once straight leg raise with < 10 degree lag Restrictions Weight Bearing Restrictions: No    Mobility  Bed Mobility Overal bed mobility: Needs Assistance Bed Mobility: Supine to Sit     Supine to sit: Min assist     General bed mobility comments: Min assist to support R LE off bed and increased time  Transfers Overall transfer level: Needs assistance Equipment used: Rolling walker (2 wheeled) Transfers: Sit to/from Stand Sit to Stand: Mod assist;Min assist         General transfer comment: cues for hand placement, RLE position and wt shift  Ambulation/Gait Ambulation/Gait assistance: Min assist;Mod assist Ambulation Distance (Feet): 52 Feet Assistive device: Rolling walker (2 wheeled) Gait Pattern/deviations: Step-to pattern;Trunk flexed Gait velocity: decreased   General Gait Details: cues for sequence, posture   Stairs            Wheelchair Mobility    Modified Rankin (Stroke Patients Only)       Balance                                    Cognition                            Exercises   Total Knee Replacement TE's 10 reps B LE ankle pumps 10 reps towel squeezes 10 reps knee presses 10 reps heel slides  10 reps SAQ's 10  reps SLR's 10 reps ABD Followed by ICE     General Comments        Pertinent Vitals/Pain C/o 4/10 during TE's ICE applied    Home Living                      Prior Function            PT Goals (current goals can now be found in the care plan section) Progress towards PT goals: Progressing toward goals    Frequency  7X/week    PT Plan      Co-evaluation             End of Session Equipment Utilized During Treatment: Gait belt Activity Tolerance: Patient limited by fatigue Patient left: in chair;with call bell/phone within reach;with family/visitor present     Time: 1030-1055 PT Time Calculation (min): 25 min  Charges:  $Gait Training: 8-22 mins $Therapeutic Exercise: 8-22 mins                    G Codes:      Rica Koyanagi  PTA [facility]  Acute  Rehab Pager  [phone] 

## 2013-08-21 NOTE — Progress Notes (Signed)
   Subjective: 2 Days Post-Op Procedure(s) (LRB): RIGHT TOTAL KNEE ARTHROPLASTY (Right) Patient reports pain as mild and moderate.   Patient seen in rounds for Dr. Wynelle Link. Sitting up in chair. Patient is well, but has had some minor complaints of pain in the knee, requiring pain medications Plan is to go [facility] after hospital stay.  Objective: Vital signs in last 24 hours: Temp:  [98 F (36.7 C)-98.6 F (37 C)] 98 F (36.7 C) (05/20 0543) Pulse Rate:  [61-90] 66 (05/20 0543) Resp:  [12-18] 12 (05/20 0543) BP: (106-155)/(62-87) 138/72 mmHg (05/20 0543) SpO2:  [96 %-99 %] 97 % (05/20 0543)  Intake/Output from previous day:  Intake/Output Summary (Last 24 hours) at 08/21/13 1309 Last data filed at 08/21/13 0930  Gross per 24 hour  Intake    240 ml  Output   1025 ml  Net   -785 ml    Intake/Output this shift: Total I/O In: 120 [P.O.:120] Out: 175 [Urine:175]  Labs:  Recent Labs  08/20/13 0430 08/21/13 0436  HGB 8.7* 8.2*    Recent Labs  08/20/13 0430 08/21/13 0436  WBC 11.0* 10.7*  RBC 2.67* 2.55*  HCT 25.9* 24.8*  PLT 171 172    Recent Labs  08/20/13 0430 08/21/13 0436  NA 136* 137  K 4.4 4.3  CL 102 102  CO2 24 25  BUN 20 25*  CREATININE 0.73 0.73  GLUCOSE 131* 121*  CALCIUM 8.7 8.7    Recent Labs  08/20/13 0430 08/21/13 0436  INR 1.11 1.42    EXAM General - Patient is Alert and Appropriate Extremity - Neurovascular intact Sensation intact distally Dorsiflexion/Plantar flexion intact Dressing/Incision - clean, dry, no drainage, some ecchymosis on the lateral knee and moderate amount noted over the proximal medial tibia region Motor Function - intact, moving foot and toes well on exam.   Past Medical History  Diagnosis Date  . Atrial fibrillation   . Hypertension     benign  . Hyperlipidemia   . GERD (gastroesophageal reflux disease)   . Lung infiltrate 05/2010    left lower lobe infiltrate, questionable pneumonia  .  Dysphagia 2001    history s/p esophageal dilatation  . Diverticulosis     history  . Urinary frequency     history  . Arthritis   . Lower leg edema     bilateral   . Hearing loss     bilateral hearing aids  . Dysrhythmia     hx. Atrial Fibrillation, RBBB  . Prostate cancer 2001    treated with radiation  . Prostate cancer   . Skin cancer     Assessment/Plan: 2 Days Post-Op Procedure(s) (LRB): RIGHT TOTAL KNEE ARTHROPLASTY (Right) Principal Problem:   OA (osteoarthritis) of knee Active Problems:   HYPERLIPIDEMIA   HYPERTENSION, BENIGN   Atrial fibrillation   GERD   Hyponatremia  Estimated body mass index is 22.24 kg/(m^2) as calculated from the following:   Height as of this encounter: 5\' 10"  (1.778 m).   Weight as of this encounter: 70.308 kg (155 lb). Up with therapy Plan for discharge tomorrow Discharge to SNF  DVT Prophylaxis - Lovenox and Coumadin INR 1.42 today Weight-Bearing as tolerated to right leg  Arlee Muslim, PA-C Orthopaedic Surgery 08/21/2013, 1:09 PM

## 2013-08-22 LAB — PROTIME-INR
INR: 1.63 — AB (ref 0.00–1.49)
PROTHROMBIN TIME: 18.9 s — AB (ref 11.6–15.2)

## 2013-08-22 LAB — CBC
HCT: 23.1 % — ABNORMAL LOW (ref 39.0–52.0)
HEMOGLOBIN: 7.7 g/dL — AB (ref 13.0–17.0)
MCH: 32.8 pg (ref 26.0–34.0)
MCHC: 33.3 g/dL (ref 30.0–36.0)
MCV: 98.3 fL (ref 78.0–100.0)
Platelets: 189 10*3/uL (ref 150–400)
RBC: 2.35 MIL/uL — AB (ref 4.22–5.81)
RDW: 13.7 % (ref 11.5–15.5)
WBC: 8.5 10*3/uL (ref 4.0–10.5)

## 2013-08-22 MED ORDER — METHOCARBAMOL 500 MG PO TABS: 500.0000 mg | ORAL_TABLET | Freq: Four times a day (QID) | ORAL | Status: DC | PRN

## 2013-08-22 MED ORDER — WARFARIN SODIUM 5 MG PO TABS: 5.0000 mg | ORAL_TABLET | Freq: Once | ORAL | Status: DC

## 2013-08-22 MED ORDER — METOCLOPRAMIDE HCL 5 MG PO TABS: 5.0000 mg | ORAL_TABLET | Freq: Three times a day (TID) | ORAL | Status: DC | PRN

## 2013-08-22 MED ORDER — OXYCODONE HCL 5 MG PO TABS: 5.0000 mg | ORAL_TABLET | ORAL | Status: DC | PRN

## 2013-08-22 MED ORDER — POLYETHYLENE GLYCOL 3350 17 G PO PACK: 17.0000 g | PACK | Freq: Every day | ORAL | Status: DC | PRN

## 2013-08-22 MED ORDER — ONDANSETRON HCL 4 MG PO TABS: 4.0000 mg | ORAL_TABLET | Freq: Four times a day (QID) | ORAL | Status: DC | PRN

## 2013-08-22 MED ORDER — ENOXAPARIN SODIUM 30 MG/0.3ML ~~LOC~~ SOLN: 30.0000 mg | Freq: Two times a day (BID) | SUBCUTANEOUS | Status: DC

## 2013-08-22 MED ORDER — TRAMADOL HCL 50 MG PO TABS: 50.0000 mg | ORAL_TABLET | Freq: Four times a day (QID) | ORAL | Status: DC | PRN

## 2013-08-22 MED ORDER — BISACODYL 10 MG RE SUPP: 10.0000 mg | Freq: Every day | RECTAL | Status: DC | PRN

## 2013-08-22 MED ORDER — DSS 100 MG PO CAPS: 100.0000 mg | ORAL_CAPSULE | Freq: Two times a day (BID) | ORAL | Status: DC

## 2013-08-22 MED ORDER — WARFARIN SODIUM 5 MG PO TABS
5.0000 mg | ORAL_TABLET | Freq: Once | ORAL | Status: DC
  Filled 2013-08-22: qty 1

## 2013-08-22 NOTE — Progress Notes (Signed)
ANTICOAGULATION CONSULT NOTE -Follow up consult  Pharmacy Consult for warfarin Indication: atrial fibrillation and VTE prophylaxis  No Known Allergies  Patient Measurements: Height: 5\' 10"  (177.8 cm) Weight: 155 lb (70.308 kg) IBW/kg (Calculated) : 73  Vital Signs: Temp: 97.5 F (36.4 C) (05/21 0531) Temp src: Oral (05/21 0531) BP: 121/82 mmHg (05/21 0531) Pulse Rate: 72 (05/21 0531)  Labs:  Recent Labs  08/20/13 0430 08/21/13 0436 08/22/13 0449  HGB 8.7* 8.2* 7.7*  HCT 25.9* 24.8* 23.1*  PLT 171 172 189  LABPROT 14.1 17.0* 18.9*  INR 1.11 1.42 1.63*  CREATININE 0.73 0.73  --     Estimated Creatinine Clearance: 65.9 ml/min (by C-G formula based on Cr of 0.73).   Medical History: Past Medical History  Diagnosis Date  . Atrial fibrillation   . Hypertension     benign  . Hyperlipidemia   . GERD (gastroesophageal reflux disease)   . Lung infiltrate 05/2010    left lower lobe infiltrate, questionable pneumonia  . Dysphagia 2001    history s/p esophageal dilatation  . Diverticulosis     history  . Urinary frequency     history  . Arthritis   . Lower leg edema     bilateral   . Hearing loss     bilateral hearing aids  . Dysrhythmia     hx. Atrial Fibrillation, RBBB  . Prostate cancer 2001    treated with radiation  . Prostate cancer   . Skin cancer     Medications:  Scheduled:  . docusate sodium  100 mg Oral BID  . enoxaparin (LOVENOX) injection  30 mg Subcutaneous Q12H  . famotidine  10 mg Oral QHS  . famotidine  20 mg Oral Daily  . furosemide  60 mg Oral Daily  . mirabegron ER  50 mg Oral q1800  . ramipril  2.5 mg Oral q morning - 10a  . terazosin  2 mg Oral QHS  . warfarin  5 mg Oral ONCE-1800  . Warfarin - Pharmacist Dosing Inpatient   Does not apply q1800   Infusions:    PRN: acetaminophen, acetaminophen, bisacodyl, diphenhydrAMINE, menthol-cetylpyridinium, methocarbamol (ROBAXIN) IV, methocarbamol, metoCLOPramide (REGLAN) injection,  metoCLOPramide, morphine injection, ondansetron (ZOFRAN) IV, ondansetron, oxyCODONE, phenol, polyethylene glycol, traMADol  Inpatient warfarin doses administered 5/18 - 5/20:  5mg , 5mg , 5mg .   Assessment: 80yo M s/p R TKA 5/18.  On warfarin PTA for afib, home dose : 5mg  on Sunday, Tuesday and Thursday; 2.5mg  on Monday, Wednesday and Friday and Saturday. Pharmacy was consulted to dose warfarin for afib/VTE prophylaxis postoperatively.   Concomitant prophylactic-dose Lovenox (30 mg SQ q12h) ordered by ortho.  5/21: POD#3 INR 1.63 (rising steadily) Plans for possible discharge to SNF today noted.  Goal of Therapy:  INR 2-3   Recommend:  1. Resume PTA warfarin dosage (2.5mg  M,W,F,Sat;  5 mg Sun, Tues, Thurs). 2. Continue prophylactic-dose Lovenox until INR 2.0 or above 3. INR daily at SNF until Lovenox discontinued, then at least 2x per week until stable and therapeutic, then weekly while at [facility]. 4. Further INR f/u per [facility].  Clayburn Pert, PharmD, BCPS Pager: [phone] 08/22/2013  7:09 AM

## 2013-08-22 NOTE — Discharge Summary (Signed)
Physician Discharge Summary   Patient ID: Gregory Werner MRN: [id number] DOB/AGE: 1925/01/28 80 y.o.  Admit date: 08/19/2013 Discharge date: 08-22-2013  Primary Diagnosis:  Osteoarthritis Right knee(s)  Admission Diagnoses:  Past Medical History  Diagnosis Date  . Atrial fibrillation   . Hypertension     benign  . Hyperlipidemia   . GERD (gastroesophageal reflux disease)   . Lung infiltrate 05/2010    left lower lobe infiltrate, questionable pneumonia  . Dysphagia 2001    history s/p esophageal dilatation  . Diverticulosis     history  . Urinary frequency     history  . Arthritis   . Lower leg edema     bilateral   . Hearing loss     bilateral hearing aids  . Dysrhythmia     hx. Atrial Fibrillation, RBBB  . Prostate cancer 2001    treated with radiation  . Prostate cancer   . Skin cancer    Discharge Diagnoses:   Principal Problem:   OA (osteoarthritis) of knee Active Problems:   HYPERLIPIDEMIA   HYPERTENSION, BENIGN   Atrial fibrillation   GERD   Hyponatremia  Estimated body mass index is 22.24 kg/(m^2) as calculated from the following:   Height as of this encounter: '5\' 10"'  (1.778 m).   Weight as of this encounter: 70.308 kg (155 lb).  Procedure:  Procedure(s) (LRB): RIGHT TOTAL KNEE ARTHROPLASTY (Right)   Consults: None  HPI: Gregory Werner is a 80 y.o. year old male with end stage OA of his right knee with progressively worsening pain and dysfunction. He has constant pain, with activity and at rest and significant functional deficits with difficulties even with ADLs. He has had extensive non-op management including analgesics, injections of cortisone, and home exercise program, but remains in significant pain with significant dysfunction. Radiographs show tricompartmental bone on bone arthritis and clinically he has a 20 degree flexion contracture. He presents now for right Total Knee Arthroplasty.   Laboratory Data: Admission on 08/19/2013  Component Date  Value Ref Range Status  . ABO/RH(D) 08/19/2013 A POS   Final  . Antibody Screen 08/19/2013 NEG   Final  . Sample Expiration 08/19/2013 08/22/2013   Final  . Prothrombin Time 08/19/2013 13.2  11.6 - 15.2 seconds Final  . INR 08/19/2013 1.02  0.00 - 1.49 Final  . WBC 08/20/2013 11.0* 4.0 - 10.5 K/uL Final  . RBC 08/20/2013 2.67* 4.22 - 5.81 MIL/uL Final  . Hemoglobin 08/20/2013 8.7* 13.0 - 17.0 g/dL Final  . HCT 08/20/2013 25.9* 39.0 - 52.0 % Final  . MCV 08/20/2013 97.0  78.0 - 100.0 fL Final  . MCH 08/20/2013 32.6  26.0 - 34.0 pg Final  . MCHC 08/20/2013 33.6  30.0 - 36.0 g/dL Final  . RDW 08/20/2013 13.2  11.5 - 15.5 % Final  . Platelets 08/20/2013 171  150 - 400 K/uL Final  . Sodium 08/20/2013 136* 137 - 147 mEq/L Final  . Potassium 08/20/2013 4.4  3.7 - 5.3 mEq/L Final  . Chloride 08/20/2013 102  96 - 112 mEq/L Final  . CO2 08/20/2013 24  19 - 32 mEq/L Final  . Glucose, Bld 08/20/2013 131* 70 - 99 mg/dL Final  . BUN 08/20/2013 20  6 - 23 mg/dL Final  . Creatinine, Ser 08/20/2013 0.73  0.50 - 1.35 mg/dL Final  . Calcium 08/20/2013 8.7  8.4 - 10.5 mg/dL Final  . GFR calc non Af Amer 08/20/2013 82* >90 mL/min Final  . GFR calc  Af Amer 08/20/2013 >90  >90 mL/min Final   Comment: (NOTE)                          The eGFR has been calculated using the CKD EPI equation.                          This calculation has not been validated in all clinical situations.                          eGFR's persistently <90 mL/min signify possible Chronic Kidney                          Disease.  Marland Kitchen Prothrombin Time 08/20/2013 14.1  11.6 - 15.2 seconds Final  . INR 08/20/2013 1.11  0.00 - 1.49 Final  . WBC 08/21/2013 10.7* 4.0 - 10.5 K/uL Final  . RBC 08/21/2013 2.55* 4.22 - 5.81 MIL/uL Final  . Hemoglobin 08/21/2013 8.2* 13.0 - 17.0 g/dL Final  . HCT 08/21/2013 24.8* 39.0 - 52.0 % Final  . MCV 08/21/2013 97.3  78.0 - 100.0 fL Final  . MCH 08/21/2013 32.2  26.0 - 34.0 pg Final  . MCHC 08/21/2013  33.1  30.0 - 36.0 g/dL Final  . RDW 08/21/2013 13.5  11.5 - 15.5 % Final  . Platelets 08/21/2013 172  150 - 400 K/uL Final  . Sodium 08/21/2013 137  137 - 147 mEq/L Final  . Potassium 08/21/2013 4.3  3.7 - 5.3 mEq/L Final  . Chloride 08/21/2013 102  96 - 112 mEq/L Final  . CO2 08/21/2013 25  19 - 32 mEq/L Final  . Glucose, Bld 08/21/2013 121* 70 - 99 mg/dL Final  . BUN 08/21/2013 25* 6 - 23 mg/dL Final  . Creatinine, Ser 08/21/2013 0.73  0.50 - 1.35 mg/dL Final  . Calcium 08/21/2013 8.7  8.4 - 10.5 mg/dL Final  . GFR calc non Af Amer 08/21/2013 82* >90 mL/min Final  . GFR calc Af Amer 08/21/2013 >90  >90 mL/min Final   Comment: (NOTE)                          The eGFR has been calculated using the CKD EPI equation.                          This calculation has not been validated in all clinical situations.                          eGFR's persistently <90 mL/min signify possible Chronic Kidney                          Disease.  Marland Kitchen Prothrombin Time 08/21/2013 17.0* 11.6 - 15.2 seconds Final  . INR 08/21/2013 1.42  0.00 - 1.49 Final  . WBC 08/22/2013 8.5  4.0 - 10.5 K/uL Final  . RBC 08/22/2013 2.35* 4.22 - 5.81 MIL/uL Final  . Hemoglobin 08/22/2013 7.7* 13.0 - 17.0 g/dL Final  . HCT 08/22/2013 23.1* 39.0 - 52.0 % Final  . MCV 08/22/2013 98.3  78.0 - 100.0 fL Final  . MCH 08/22/2013 32.8  26.0 - 34.0 pg Final  . MCHC 08/22/2013 33.3  30.0 - 36.0 g/dL Final  .  RDW 08/22/2013 13.7  11.5 - 15.5 % Final  . Platelets 08/22/2013 189  150 - 400 K/uL Final  . Prothrombin Time 08/22/2013 18.9* 11.6 - 15.2 seconds Final  . INR 08/22/2013 1.63* 0.00 - 1.49 Final  Hospital Outpatient Visit on 08/09/2013  Component Date Value Ref Range Status  . MRSA, PCR 08/09/2013 POSITIVE* NEGATIVE Final   Comment: RESULT CALLED TO, READ BACK BY AND VERIFIED WITH:                          M.FAISON AT 1659 ON [id number]  . Staphylococcus aureus 08/09/2013 POSITIVE* NEGATIVE Final   Comment:                                  The Xpert SA Assay (FDA                          approved for NASAL specimens                          in patients over 23 years of age),                          is one component of                          a comprehensive surveillance                          program.  Test performance has                          been validated by [employer] for patients greater                          than or equal to 97 year old.                          It is not intended                          to diagnose infection nor to                          guide or monitor treatment.  Marland Kitchen aPTT 08/09/2013 43* 24 - 37 seconds Final   Comment:                                 IF BASELINE aPTT IS ELEVATED,                          SUGGEST PATIENT RISK ASSESSMENT                          BE USED TO DETERMINE APPROPRIATE  ANTICOAGULANT THERAPY.  . WBC 08/09/2013 5.8  4.0 - 10.5 K/uL Final  . RBC 08/09/2013 3.41* 4.22 - 5.81 MIL/uL Final  . Hemoglobin 08/09/2013 10.8* 13.0 - 17.0 g/dL Final  . HCT 08/09/2013 33.2* 39.0 - 52.0 % Final  . MCV 08/09/2013 97.4  78.0 - 100.0 fL Final  . MCH 08/09/2013 31.7  26.0 - 34.0 pg Final  . MCHC 08/09/2013 32.5  30.0 - 36.0 g/dL Final  . RDW 08/09/2013 13.5  11.5 - 15.5 % Final  . Platelets 08/09/2013 202  150 - 400 K/uL Final  . Sodium 08/09/2013 136* 137 - 147 mEq/L Final  . Potassium 08/09/2013 4.7  3.7 - 5.3 mEq/L Final  . Chloride 08/09/2013 98  96 - 112 mEq/L Final  . CO2 08/09/2013 28  19 - 32 mEq/L Final  . Glucose, Bld 08/09/2013 99  70 - 99 mg/dL Final  . BUN 08/09/2013 29* 6 - 23 mg/dL Final  . Creatinine, Ser 08/09/2013 1.06  0.50 - 1.35 mg/dL Final  . Calcium 08/09/2013 9.3  8.4 - 10.5 mg/dL Final  . Total Protein 08/09/2013 6.5  6.0 - 8.3 g/dL Final  . Albumin 08/09/2013 3.5  3.5 - 5.2 g/dL Final  . AST 08/09/2013 25  0 - 37 U/L Final  . ALT 08/09/2013 20  0 - 53 U/L Final  . Alkaline Phosphatase  08/09/2013 72  39 - 117 U/L Final  . Total Bilirubin 08/09/2013 0.4  0.3 - 1.2 mg/dL Final  . GFR calc non Af Amer 08/09/2013 61* >90 mL/min Final  . GFR calc Af Amer 08/09/2013 71* >90 mL/min Final   Comment: (NOTE)                          The eGFR has been calculated using the CKD EPI equation.                          This calculation has not been validated in all clinical situations.                          eGFR's persistently <90 mL/min signify possible Chronic Kidney                          Disease.  Marland Kitchen Prothrombin Time 08/09/2013 23.9* 11.6 - 15.2 seconds Final  . INR 08/09/2013 2.22* 0.00 - 1.49 Final  . Color, Urine 08/09/2013 YELLOW  YELLOW Final  . APPearance 08/09/2013 CLEAR  CLEAR Final  . Specific Gravity, Urine 08/09/2013 1.023  1.005 - 1.030 Final  . pH 08/09/2013 7.0  5.0 - 8.0 Final  . Glucose, UA 08/09/2013 NEGATIVE  NEGATIVE mg/dL Final  . Hgb urine dipstick 08/09/2013 NEGATIVE  NEGATIVE Final  . Bilirubin Urine 08/09/2013 NEGATIVE  NEGATIVE Final  . Ketones, ur 08/09/2013 NEGATIVE  NEGATIVE mg/dL Final  . Protein, ur 08/09/2013 NEGATIVE  NEGATIVE mg/dL Final  . Urobilinogen, UA 08/09/2013 2.0* 0.0 - 1.0 mg/dL Final  . Nitrite 08/09/2013 NEGATIVE  NEGATIVE Final  . Leukocytes, UA 08/09/2013 NEGATIVE  NEGATIVE Final   MICROSCOPIC NOT DONE ON URINES WITH NEGATIVE PROTEIN, BLOOD, LEUKOCYTES, NITRITE, OR GLUCOSE <1000 mg/dL.  Anti-coag visit on 08/05/2013  Component Date Value Ref Range Status  . INR 08/05/2013 2.3   Final  Anti-coag visit on 07/08/2013  Component Date Value Ref Range Status  . INR  07/08/2013 3.2   Final     X-Rays:Dg Chest 2 View  08/09/2013   CLINICAL DATA:  Smoker, preop knee surgery.  EXAM: CHEST  2 VIEW  COMPARISON:  Chest x-ray 09/14/2010.  FINDINGS: Mediastinum and hilar structures are normal. Cardiomegaly, normal pulmonary vascularity. No pleural effusion or pneumothorax. Basilar atelectasis and/or scarring again noted. No acute bony  abnormality.  IMPRESSION: 1. Stable cardiomegaly, no CHF. 2. Stable bibasilar atelectasis and/or scarring.   Electronically Signed   By: Marcello Moores  Register   On: 08/09/2013 16:14    EKG: Orders placed in visit on 06/07/13  . EKG 12-LEAD     Hospital Course: Gregory Werner is a 80 y.o. who was admitted to [facility]. They were brought to the operating room on 08/19/2013 and underwent Procedure(s): RIGHT TOTAL KNEE ARTHROPLASTY.  Patient tolerated the procedure well and was later transferred to the recovery room and then to the orthopaedic floor for postoperative care.  They were given PO and IV analgesics for pain control following their surgery.  They were given 24 hours of postoperative antibiotics of  Anti-infectives   Start     Dose/Rate Route Frequency Ordered Stop   08/19/13 1400  ceFAZolin (ANCEF) IVPB 1 g/50 mL premix     1 g 100 mL/hr over 30 Minutes Intravenous Every 6 hours 08/19/13 1200 08/19/13 2031   08/19/13 0605  ceFAZolin (ANCEF) IVPB 2 g/50 mL premix     2 g 100 mL/hr over 30 Minutes Intravenous On call to O.R. 08/19/13 6010 08/19/13 0820     and started on DVT prophylaxis in the form of Lovenox and Coumadin.   PT and OT were ordered for total joint protocol.  Discharge planning consulted to help with postop disposition and equipment needs. Social worker got involved to assist with placement of the patient. Patient had a tough night on the evening of surgery due to pain but did get up and walked about 7 feet though.  They started to get up OOB with therapy on day one. Hemovac drain was pulled without difficulty.  Continued to work with therapy into day two.  Dressing was changed on day two and the incision was healing well but did have some ecchymosis on the lateral knee and moderate amount noted over the proximal medial tibia region.  By day three, the patient had progressed with therapy and meeting their goals.  Incision was healing well.  Patient was seen in rounds and  was ready to go to [facility] for rehab.  Discharge to SNF  Diet - Cardiac diet  Follow up - in 2 weeks  Activity - WBAT  Disposition - Skilled nursing facility - [facility]  Condition Upon Discharge - Good  D/C Meds - See DC Summary  DVT Prophylaxis - Lovenox and Coumadin  INR 1.63 at time of transfer.  Continue Lovenox injections until the INR is therapeutic at or greater than 2.0. When INR reaches the therapeutic level of equal to or greater than 2.0, the patient may discontinue the Lovenox injections.  Take Coumadin for three weeks for postoperative protocol and then the patient may resume their previous Coumadin home regimen. The dose may need to be adjusted based upon the INR. Please follow the INR and titrate Coumadin dose for a therapeutic range between 2.0 and 3.0 INR. After completing the three weeks of Coumadin, the patient may resume their previous Coumadin home regimen.   Discharge Instructions   Call MD / Call 911  Complete by:  As directed   If you experience chest pain or shortness of breath, CALL 911 and be transported to the hospital emergency room.  If you develope a fever above 101 F, pus (white drainage) or increased drainage or redness at the wound, or calf pain, call your surgeon's office.     Change dressing    Complete by:  As directed   Change dressing daily with sterile 4 x 4 inch gauze dressing and apply TED hose. Do not submerge the incision under water.     Constipation Prevention    Complete by:  As directed   Drink plenty of fluids.  Prune juice may be helpful.  You may use a stool softener, such as Colace (over the counter) 100 mg twice a day.  Use MiraLax (over the counter) for constipation as needed.     Diet - low sodium heart healthy    Complete by:  As directed      Discharge instructions    Complete by:  As directed   Pick up stool softner and laxative for home. Do not submerge incision under water. May shower. Continue to use ice for pain  and swelling from surgery.  Take Coumadin for three weeks for postoperative protocol and then the patient may resume their previous Coumadin home regimen.  The dose may need to be adjusted based upon the INR.  Please follow the INR and titrate Coumadin dose for a therapeutic range between 2.0 and 3.0 INR.  After completing the three weeks of Coumadin, the patient may resume their previous Coumadin home regimen.  Continue Lovenox injections until the INR is therapeutic at or greater than 2.0.  When INR reaches the therapeutic level of equal to or greater than 2.0, the patient may discontinue the Lovenox injections.  When discharged from the skilled rehab facility, please have the facility set up the patient's [place] prior to being released.  Also provide the patient with their medications at time of release from the facility to include their pain medication, the muscle relaxants, and their blood thinner medication.  If the patient is still at the rehab facility at time of follow up appointment, please also assist the patient in arranging follow up appointment in our office and any transportation needs.     Do not put a pillow under the knee. Place it under the heel.    Complete by:  As directed      Do not sit on low chairs, stoools or toilet seats, as it may be difficult to get up from low surfaces    Complete by:  As directed      Driving restrictions    Complete by:  As directed   No driving until released by the physician.     Increase activity slowly as tolerated    Complete by:  As directed      Lifting restrictions    Complete by:  As directed   No lifting until released by the physician.     Patient may shower    Complete by:  As directed   You may shower without a dressing once there is no drainage.  Do not wash over the wound.  If drainage remains, do not shower until drainage stops.     TED hose    Complete by:  As directed   Use stockings (TED hose) for 3  weeks on both leg(s).  You may remove them at night for sleeping.  Weight bearing as tolerated    Complete by:  As directed             Medication List    STOP taking these medications       celecoxib 200 MG capsule  Commonly known as:  CELEBREX     fish oil-omega-3 fatty acids 1000 MG capsule     GLUCOSAMINE CHONDR COMPLEX PO     ibandronate 150 MG tablet  Commonly known as:  BONIVA     VITAMIN B 12 PO     vitamin C 1000 MG tablet      TAKE these medications       acetaminophen 500 MG tablet  Commonly known as:  TYLENOL  Take 500 mg by mouth every 6 (six) hours as needed (Pain).     bisacodyl 10 MG suppository  Commonly known as:  DULCOLAX  Place 1 suppository (10 mg total) rectally daily as needed for moderate constipation.     DSS 100 MG Caps  Take 100 mg by mouth 2 (two) times daily.     enoxaparin 30 MG/0.3ML injection  Commonly known as:  LOVENOX  Inject 0.3 mLs (30 mg total) into the skin every 12 (twelve) hours. Continue Lovenox injections until the INR is therapeutic at or greater than 2.0.  When INR reaches the therapeutic level of equal to or greater than 2.0, the patient may discontinue the Lovenox injections.     famotidine 10 MG chewable tablet  Commonly known as:  PEPCID AC  Chew 10 mg by mouth at bedtime.     famotidine 20 MG tablet  Commonly known as:  PEPCID  Take 20 mg by mouth daily.     furosemide 20 MG tablet  Commonly known as:  LASIX  Take 60 mg by mouth daily.     methocarbamol 500 MG tablet  Commonly known as:  ROBAXIN  Take 1 tablet (500 mg total) by mouth every 6 (six) hours as needed for muscle spasms.     metoCLOPramide 5 MG tablet  Commonly known as:  REGLAN  Take 1 tablet (5 mg total) by mouth every 8 (eight) hours as needed for nausea (if ondansetron (ZOFRAN) ineffective.).     MYRBETRIQ 50 MG Tb24 tablet  Generic drug:  mirabegron ER  Take 1 tablet by mouth daily. Takes at PM     ondansetron 4 MG tablet    Commonly known as:  ZOFRAN  Take 1 tablet (4 mg total) by mouth every 6 (six) hours as needed for nausea.     oxyCODONE 5 MG immediate release tablet  Commonly known as:  Oxy IR/ROXICODONE  Take 1-2 tablets (5-10 mg total) by mouth every 3 (three) hours as needed for moderate pain, severe pain or breakthrough pain.     polyethylene glycol packet  Commonly known as:  MIRALAX / GLYCOLAX  Take 17 g by mouth daily as needed for mild constipation.     ramipril 2.5 MG capsule  Commonly known as:  ALTACE  Take 2.5 mg by mouth every morning.     terazosin 2 MG capsule  Commonly known as:  HYTRIN  Take 2 mg by mouth at bedtime.     traMADol 50 MG tablet  Commonly known as:  ULTRAM  Take 1-2 tablets (50-100 mg total) by mouth every 6 (six) hours as needed for moderate pain.     warfarin 5 MG tablet  Commonly known as:  COUMADIN  Take 1 tablet (5 mg total) by  mouth one time only at 6 PM. Take Coumadin for three weeks for postoperative protocol and then the patient may resume their previous Coumadin home regimen.  The dose may need to be adjusted based upon the INR.  Please follow the INR and titrate Coumadin dose for a therapeutic range between 2.0 and 3.0 INR.  After completing the three weeks of Coumadin, the patient may resume their previous Coumadin home regimen.           Follow-up Information   Follow up with Gearlean Alf, MD. Schedule an appointment as soon as possible for a visit on 09/03/2013.   Specialty:  Orthopedic Surgery   Contact information:   [address] [place] [address] [phone]       Signed: Arlee Muslim, PA-C Orthopaedic Surgery 08/22/2013, 9:16 AM

## 2013-08-22 NOTE — Progress Notes (Signed)
Subjective: 3 Days Post-Op Procedure(s) (LRB): RIGHT TOTAL KNEE ARTHROPLASTY (Right) Patient reports pain as mild.   Patient seen in rounds with Dr. Wynelle Link. Patient is well, and has had no acute complaints or problems Patient is ready to go to [facility] today.  Objective: Vital signs in last 24 hours: Temp:  [97.5 F (36.4 C)-99.5 F (37.5 C)] 97.5 F (36.4 C) (05/21 0531) Pulse Rate:  [66-72] 72 (05/21 0531) Resp:  [14-18] 16 (05/21 0531) BP: (96-149)/(56-82) 121/82 mmHg (05/21 0531) SpO2:  [94 %-100 %] 94 % (05/21 0531)  Intake/Output from previous day:  Intake/Output Summary (Last 24 hours) at 08/22/13 0906 Last data filed at 08/22/13 0830  Gross per 24 hour  Intake    480 ml  Output    825 ml  Net   -345 ml    Intake/Output this shift: Total I/O In: -  Out: 200 [Urine:200]  Labs:  Recent Labs  08/20/13 0430 08/21/13 0436 08/22/13 0449  HGB 8.7* 8.2* 7.7*    Recent Labs  08/21/13 0436 08/22/13 0449  WBC 10.7* 8.5  RBC 2.55* 2.35*  HCT 24.8* 23.1*  PLT 172 189    Recent Labs  08/20/13 0430 08/21/13 0436  NA 136* 137  K 4.4 4.3  CL 102 102  CO2 24 25  BUN 20 25*  CREATININE 0.73 0.73  GLUCOSE 131* 121*  CALCIUM 8.7 8.7    Recent Labs  08/21/13 0436 08/22/13 0449  INR 1.42 1.63*    EXAM: General - Patient is Alert, Appropriate and Oriented Extremity - Neurovascular intact Sensation intact distally Dorsiflexion/Plantar flexion intact Incision - clean, dry, no drainage, healing, some ecchymosis on the lateral knee and moderate amount noted over the proximal medial tibia region Motor Function - intact, moving foot and toes well on exam.   Assessment/Plan: 3 Days Post-Op Procedure(s) (LRB): RIGHT TOTAL KNEE ARTHROPLASTY (Right) Procedure(s) (LRB): RIGHT TOTAL KNEE ARTHROPLASTY (Right) Past Medical History  Diagnosis Date  . Atrial fibrillation   . Hypertension     benign  . Hyperlipidemia   . GERD (gastroesophageal  reflux disease)   . Lung infiltrate 05/2010    left lower lobe infiltrate, questionable pneumonia  . Dysphagia 2001    history s/p esophageal dilatation  . Diverticulosis     history  . Urinary frequency     history  . Arthritis   . Lower leg edema     bilateral   . Hearing loss     bilateral hearing aids  . Dysrhythmia     hx. Atrial Fibrillation, RBBB  . Prostate cancer 2001    treated with radiation  . Prostate cancer   . Skin cancer    Principal Problem:   OA (osteoarthritis) of knee Active Problems:   HYPERLIPIDEMIA   HYPERTENSION, BENIGN   Atrial fibrillation   GERD   Hyponatremia  Estimated body mass index is 22.24 kg/(m^2) as calculated from the following:   Height as of this encounter: 5\' 10"  (1.778 m).   Weight as of this encounter: 70.308 kg (155 lb). Discharge to SNF Diet - Cardiac diet Follow up - in 2 weeks Activity - WBAT Disposition - Skilled nursing facility - [facility] Condition Upon Discharge - Good D/C Meds - See DC Summary DVT Prophylaxis - Lovenox and Coumadin INR 1.63 at time of transfer. Continue Lovenox injections until the INR is therapeutic at or greater than 2.0.  When INR reaches the therapeutic level of equal to or greater than 2.0, the patient  may discontinue the Lovenox injections. Take Coumadin for three weeks for postoperative protocol and then the patient may resume their previous Coumadin home regimen.  The dose may need to be adjusted based upon the INR.  Please follow the INR and titrate Coumadin dose for a therapeutic range between 2.0 and 3.0 INR.  After completing the three weeks of Coumadin, the patient may resume their previous Coumadin home regimen.  Arlee Muslim, PA-C Orthopaedic Surgery 08/22/2013, 9:06 AM

## 2013-08-22 NOTE — Progress Notes (Signed)
Clinical Social Work Department CLINICAL SOCIAL WORK PLACEMENT NOTE 08/22/2013  Patient:  Gregory Werner, Gregory Werner  Account Number:  [id number] Admit date:  08/19/2013  Clinical Social Worker:  [name], LCSW  Date/time:  08/19/2013 01:43 PM  Clinical Social Work is seeking post-discharge placement for this patient at the following level of care:   SKILLED NURSING   (*CSW will update this form in Epic as items are completed)     Patient/family provided with [facility] Department of Clinical Social Work's list of facilities offering this level of care within the geographic area requested by the patient (or if unable, by the patient's family).  08/19/2013  Patient/family informed of their freedom to choose among providers that offer the needed level of care, that participate in Medicare, Medicaid or managed care program needed by the patient, have an available bed and are willing to accept the patient.    Patient/family informed of [facility]' ownership interest in [facility], as well as of the fact that they are under no obligation to receive care at this facility.  PASARR submitted to EDS on 08/19/2013 PASARR number received from EDS on 08/19/2013  FL2 transmitted to all facilities in geographic area requested by pt/family on  08/19/2013 FL2 transmitted to all facilities within larger geographic area on   Patient informed that his/her managed care company has contracts with or will negotiate with  certain facilities, including the following:     Patient/family informed of bed offers received:  08/19/2013 Patient chooses bed at [facility] Physician recommends and patient chooses bed at    Patient to be transferred to [facility] on  08/22/2013 Patient to be transferred to facility by [facility]  The following physician request were entered in Epic:   Additional Comments: [name] LCSW [phone]

## 2013-08-22 NOTE — Progress Notes (Signed)
Discharge summary sent to payer through [employer]  

## 2013-08-22 NOTE — Discharge Instructions (Signed)
° °Dr. Frank Aluisio °Total Joint Specialist °[facility] °[address] °[place], [place] [address] °[phone] ° °TOTAL KNEE REPLACEMENT POSTOPERATIVE DIRECTIONS ° ° ° °Knee Rehabilitation, Guidelines Following Surgery  °Results after knee surgery are often greatly improved when you follow the exercise, range of motion and muscle strengthening exercises prescribed by your doctor. Safety measures are also important to protect the knee from further injury. Any time any of these exercises cause you to have increased pain or swelling in your knee joint, decrease the amount until you are comfortable again and slowly increase them. If you have problems or questions, call your caregiver or physical therapist for advice.  ° °HOME CARE INSTRUCTIONS  °Remove items at home which could result in a fall. This includes throw rugs or furniture in walking pathways.  °Continue medications as instructed at time of discharge. °You may have some home medications which will be placed on hold until you complete the course of blood thinner medication.  °You may start showering once you are discharged home but do not submerge the incision under water. Just pat the incision dry and apply a dry gauze dressing on daily. °Walk with walker as instructed.  °You may resume a sexual relationship in one month or when given the OK by  your doctor.  °· Use walker as long as suggested by your caregivers. °· Avoid periods of inactivity such as sitting longer than an hour when not asleep. This helps prevent blood clots.  °You may put full weight on your legs and walk as much as is comfortable.  °You may return to work once you are cleared by your doctor.  °Do not drive a car for 6 weeks or until released by you surgeon.  °· Do not drive while taking narcotics.  °Wear the elastic stockings for three weeks following surgery during the day but you may remove then at night. °Make sure you keep all of your appointments after your  operation with all of your doctors and caregivers. You should call the office at the above phone number and make an appointment for approximately two weeks after the date of your surgery. °Change the dressing daily and reapply a dry dressing each time. °Please pick up a stool softener and laxative for home use as long as you are requiring pain medications. °· Continue to use ice on the knee for pain and swelling from surgery. You may notice swelling that will progress down to the foot and ankle.  This is normal after surgery.  Elevate the leg when you are not up walking on it.   °It is important for you to complete the blood thinner medication as prescribed by your doctor. °· Continue to use the breathing machine which will help keep your temperature down.  It is common for your temperature to cycle up and down following surgery, especially at night when you are not up moving around and exerting yourself.  The breathing machine keeps your lungs expanded and your temperature down. ° °RANGE OF MOTION AND STRENGTHENING EXERCISES  °Rehabilitation of the knee is important following a knee injury or an operation. After just a few days of immobilization, the muscles of the thigh which control the knee become weakened and shrink (atrophy). Knee exercises are designed to build up the tone and strength of the thigh muscles and to improve knee motion. Often times heat used for twenty to thirty minutes before working out will loosen up your tissues and help with improving the   range of motion but do not use heat for the first two weeks following surgery. These exercises can be done on a training (exercise) mat, on the floor, on a table or on a bed. Use what ever works the best and is most comfortable for you Knee exercises include:  °Leg Lifts - While your knee is still immobilized in a splint or cast, you can do straight leg raises. Lift the leg to 60 degrees, hold for 3 sec, and slowly lower the leg. Repeat 10-20 times 2-3  times daily. Perform this exercise against resistance later as your knee gets better.  °Quad and Hamstring Sets - Tighten up the muscle on the front of the thigh (Quad) and hold for 5-10 sec. Repeat this 10-20 times hourly. Hamstring sets are done by pushing the foot backward against an object and holding for 5-10 sec. Repeat as with quad sets.  °A rehabilitation program following serious knee injuries can speed recovery and prevent re-injury in the future due to weakened muscles. Contact your doctor or a physical therapist for more information on knee rehabilitation.  ° °SKILLED REHAB INSTRUCTIONS: °If the patient is transferred to a skilled rehab facility following release from the hospital, a list of the current medications will be sent to the facility for the patient to continue.  When discharged from the skilled rehab facility, please have the facility set up the patient's Home Health Physical Therapy prior to being released. Also, the skilled facility will be responsible for providing the patient with their medications at time of release from the facility to include their pain medication, the muscle relaxants, and their blood thinner medication. If the patient is still at the rehab facility at time of the two week follow up appointment, the skilled rehab facility will also need to assist the patient in arranging follow up appointment in our office and any transportation needs. ° °MAKE SURE YOU:  °Understand these instructions.  °Will watch your condition.  °Will get help right away if you are not doing well or get worse.  ° ° °Pick up stool softner and laxative for home. °Do not submerge incision under water. °May shower. °Continue to use ice for pain and swelling from surgery. ° °Take Coumadin for three weeks for postoperative protocol and then the patient may resume their previous Coumadin home regimen.  The dose may need to be adjusted based upon the INR.  Please follow the INR and titrate Coumadin dose for  a therapeutic range between 2.0 and 3.0 INR.  After completing the three weeks of Coumadin, the patient may resume their previous Coumadin home regimen. ° °Continue Lovenox injections until the INR is therapeutic at or greater than 2.0.  When INR reaches the therapeutic level of equal to or greater than 2.0, the patient may discontinue the Lovenox injections. ° °When discharged from the skilled rehab facility, please have the facility set up the patient's Home Health Physical Therapy prior to being released.  Also provide the patient with their medications at time of release from the facility to include their pain medication, the muscle relaxants, and their blood thinner medication.  If the patient is still at the rehab facility at time of follow up appointment, please also assist the patient in arranging follow up appointment in our office and any transportation needs. ° ° ° °

## 2013-09-02 ENCOUNTER — Emergency Department (HOSPITAL_COMMUNITY)
Admission: EM | Admit: 2013-09-02 | Discharge: 2013-09-02 | Disposition: A | Discharge status: Home / Self Care | Payer: Medicare Other | Attending: Emergency Medicine | Admitting: Emergency Medicine

## 2013-09-02 ENCOUNTER — Emergency Department (HOSPITAL_COMMUNITY): Payer: Medicare Other

## 2013-09-02 ENCOUNTER — Encounter (HOSPITAL_COMMUNITY): Payer: Self-pay | Admitting: Emergency Medicine

## 2013-09-02 DIAGNOSIS — I10 Essential (primary) hypertension: Secondary | ICD-10-CM | POA: Insufficient documentation

## 2013-09-02 DIAGNOSIS — Y9301 Activity, walking, marching and hiking: Secondary | ICD-10-CM | POA: Insufficient documentation

## 2013-09-02 DIAGNOSIS — Z923 Personal history of irradiation: Secondary | ICD-10-CM | POA: Insufficient documentation

## 2013-09-02 DIAGNOSIS — Z96659 Presence of unspecified artificial knee joint: Secondary | ICD-10-CM | POA: Insufficient documentation

## 2013-09-02 DIAGNOSIS — Z87891 Personal history of nicotine dependence: Secondary | ICD-10-CM | POA: Insufficient documentation

## 2013-09-02 DIAGNOSIS — S0003XA Contusion of scalp, initial encounter: Secondary | ICD-10-CM | POA: Insufficient documentation

## 2013-09-02 DIAGNOSIS — S0083XA Contusion of other part of head, initial encounter: Secondary | ICD-10-CM | POA: Insufficient documentation

## 2013-09-02 DIAGNOSIS — Z8546 Personal history of malignant neoplasm of prostate: Secondary | ICD-10-CM | POA: Insufficient documentation

## 2013-09-02 DIAGNOSIS — M129 Arthropathy, unspecified: Secondary | ICD-10-CM | POA: Insufficient documentation

## 2013-09-02 DIAGNOSIS — I4891 Unspecified atrial fibrillation: Secondary | ICD-10-CM | POA: Insufficient documentation

## 2013-09-02 DIAGNOSIS — S0100XA Unspecified open wound of scalp, initial encounter: Secondary | ICD-10-CM | POA: Insufficient documentation

## 2013-09-02 DIAGNOSIS — Y9210 Unspecified residential institution as the place of occurrence of the external cause: Secondary | ICD-10-CM | POA: Insufficient documentation

## 2013-09-02 DIAGNOSIS — K219 Gastro-esophageal reflux disease without esophagitis: Secondary | ICD-10-CM | POA: Insufficient documentation

## 2013-09-02 DIAGNOSIS — Z85828 Personal history of other malignant neoplasm of skin: Secondary | ICD-10-CM | POA: Insufficient documentation

## 2013-09-02 DIAGNOSIS — E785 Hyperlipidemia, unspecified: Secondary | ICD-10-CM | POA: Insufficient documentation

## 2013-09-02 DIAGNOSIS — W1809XA Striking against other object with subsequent fall, initial encounter: Secondary | ICD-10-CM | POA: Insufficient documentation

## 2013-09-02 DIAGNOSIS — S0101XA Laceration without foreign body of scalp, initial encounter: Secondary | ICD-10-CM

## 2013-09-02 DIAGNOSIS — Z7901 Long term (current) use of anticoagulants: Secondary | ICD-10-CM | POA: Insufficient documentation

## 2013-09-02 DIAGNOSIS — S1093XA Contusion of unspecified part of neck, initial encounter: Secondary | ICD-10-CM

## 2013-09-02 DIAGNOSIS — Z79899 Other long term (current) drug therapy: Secondary | ICD-10-CM | POA: Insufficient documentation

## 2013-09-02 DIAGNOSIS — Z8669 Personal history of other diseases of the nervous system and sense organs: Secondary | ICD-10-CM | POA: Insufficient documentation

## 2013-09-02 MED ORDER — ACETAMINOPHEN 500 MG PO TABS
1000.0000 mg | ORAL_TABLET | Freq: Once | ORAL | Status: AC
  Administered 2013-09-02: 1000 mg via ORAL
  Filled 2013-09-02: qty 2

## 2013-09-02 NOTE — Discharge Instructions (Signed)
Take your pain meds as prescribed.   Don't wash your hair today but you can wash it tomorrow but please don't scrub the area.   Keep wound clean and dry.   Staple removal in a week.   Follow up with your doctor.   Fall precautions at the facility.   Return to ER if you have severe pain, vomiting, headaches, bleeding.

## 2013-09-02 NOTE — ED Notes (Signed)
Bed: ZW25 Expected date:  Expected time:  Means of arrival:  Comments: EMS- Fall, Head lac

## 2013-09-02 NOTE — ED Notes (Signed)
MD at bedside. 

## 2013-09-02 NOTE — ED Notes (Addendum)
Per EMS, Pt, from [facility], presents after an unwitnessed fall. Pt sts he lost his balance, while trying to maneuver around a wheelchair, and fell.  Pt c/o headache and laceration to posterior head noted.  Bleeding is controlled.  Denies LOC.  A & Ox4.  Pt is on blood thinners.  Pt is at facility for rehab post R knee replacement.

## 2013-09-02 NOTE — ED Notes (Signed)
Pt given d/c instructions and verbalized understanding.  Pt directed to have staples removed x 1 week.

## 2013-09-02 NOTE — ED Provider Notes (Signed)
CSN: [id number]     Arrival date & time 09/02/13  0856 History   First MD Initiated Contact with Patient 09/02/13 [phone]     Chief Complaint  Patient presents with  . Fall  . Head Injury     (Consider location/radiation/quality/duration/timing/severity/associated sxs/prior Treatment) The history is provided by the patient.  Gregory Werner is a 80 y.o. male hx of afib on coumadin, HTN, HL, recent knee replacement here with fall. He had a right knee replacement 2 weeks ago and is currently in rehabilitation. The rehabilitation center he was walking and suddenly fall and hit his head. Denies syncope or loss of consciousness. Is currently on Coumadin and INR was subtherapeutic for a week ago. Denies headache or vomiting. Was noted to have posterior scalp laceration that stopped bleeding. Tetanus up to date. Denies other injuries.    Past Medical History  Diagnosis Date  . Atrial fibrillation   . Hypertension     benign  . Hyperlipidemia   . GERD (gastroesophageal reflux disease)   . Lung infiltrate 05/2010    left lower lobe infiltrate, questionable pneumonia  . Dysphagia 2001    history s/p esophageal dilatation  . Diverticulosis     history  . Urinary frequency     history  . Arthritis   . Lower leg edema     bilateral   . Hearing loss     bilateral hearing aids  . Dysrhythmia     hx. Atrial Fibrillation, RBBB  . Prostate cancer 2001    treated with radiation  . Prostate cancer   . Skin cancer    Past Surgical History  Procedure Laterality Date  . Esophageal dilation  2001  . Tonsillectomy    . Cataract extraction  2008  . Replacement total knee  2012    left  . Eye surgery    . Prostate surgery    . Vasectomy    . Joint replacement      LTKA  . Total knee arthroplasty Right 08/19/2013    Procedure: RIGHT TOTAL KNEE ARTHROPLASTY;  Surgeon: Gearlean Alf, MD;  Location: [facility];  Service: Orthopedics;  Laterality: Right;   Family History  Problem Relation Age of  Onset  . Heart attack Mother   . Heart attack Father   . Stroke Father    History  Substance Use Topics  . Smoking status: Former Research scientist (life sciences)  . Smokeless tobacco: Never Used     Comment: smoked only 7 years and not everyday  . Alcohol Use: 0.6 oz/week    1 Cans of beer per week     Comment: beer nightly 1    Review of Systems  Skin: Positive for wound.  All other systems reviewed and are negative.     Allergies  Review of patient's allergies indicates no known allergies.  Home Medications   Prior to Admission medications   Medication Sig Start Date End Date Taking? Authorizing Provider  acetaminophen (TYLENOL) 500 MG tablet Take 500 mg by mouth every 6 (six) hours as needed (Pain).   Yes Historical Provider, MD  bisacodyl (DULCOLAX) 10 MG suppository Place 1 suppository (10 mg total) rectally daily as needed for moderate constipation. 08/22/13  Yes Arlee Muslim, PA-C  docusate sodium 100 MG CAPS Take 100 mg by mouth 2 (two) times daily. 08/22/13  Yes Arlee Muslim, PA-C  enoxaparin (LOVENOX) 30 MG/0.3ML injection Inject 0.3 mLs (30 mg total) into the skin every 12 (twelve) hours. Continue Lovenox injections until  the INR is therapeutic at or greater than 2.0.  When INR reaches the therapeutic level of equal to or greater than 2.0, the patient may discontinue the Lovenox injections. 08/22/13  Yes Arlee Muslim, PA-C  famotidine (PEPCID AC) 10 MG chewable tablet Chew 10 mg by mouth at bedtime.   Yes Historical Provider, MD  famotidine (PEPCID) 20 MG tablet Take 20 mg by mouth daily.    Yes Historical Provider, MD  furosemide (LASIX) 20 MG tablet Take 60 mg by mouth daily.  05/28/13  Yes Historical Provider, MD  methocarbamol (ROBAXIN) 500 MG tablet Take 1 tablet (500 mg total) by mouth every 6 (six) hours as needed for muscle spasms. 08/22/13  Yes Arlee Muslim, PA-C  metoCLOPramide (REGLAN) 5 MG tablet Take 1 tablet (5 mg total) by mouth every 8 (eight) hours as needed for nausea (if  ondansetron (ZOFRAN) ineffective.). 08/22/13  Yes Arlee Muslim, PA-C  MYRBETRIQ 50 MG TB24 Take 1 tablet by mouth daily. Takes at [facility] 07/19/12  Yes Historical Provider, MD  ondansetron (ZOFRAN) 4 MG tablet Take 1 tablet (4 mg total) by mouth every 6 (six) hours as needed for nausea. 08/22/13  Yes Arlee Muslim, PA-C  oxyCODONE (OXY IR/ROXICODONE) 5 MG immediate release tablet Take 1-2 tablets (5-10 mg total) by mouth every 3 (three) hours as needed for moderate pain, severe pain or breakthrough pain. 08/22/13  Yes Arlee Muslim, PA-C  polyethylene glycol (MIRALAX / GLYCOLAX) packet Take 17 g by mouth daily as needed for mild constipation. 08/22/13  Yes Arlee Muslim, PA-C  ramipril (ALTACE) 2.5 MG capsule Take 2.5 mg by mouth every morning.   Yes Historical Provider, MD  terazosin (HYTRIN) 2 MG capsule Take 2 mg by mouth at bedtime.     Yes Historical Provider, MD  traMADol (ULTRAM) 50 MG tablet Take 1-2 tablets (50-100 mg total) by mouth every 6 (six) hours as needed for moderate pain. 08/22/13  Yes Arlee Muslim, PA-C  warfarin (COUMADIN) 5 MG tablet Take 1 tablet (5 mg total) by mouth one time only at 6 PM. Take Coumadin for three weeks for postoperative protocol and then the patient may resume their previous Coumadin home regimen.  The dose may need to be adjusted based upon the INR.  Please follow the INR and titrate Coumadin dose for a therapeutic range between 2.0 and 3.0 INR.  After completing the three weeks of Coumadin, the patient may resume their previous Coumadin home regimen. 08/22/13  Yes Arlee Muslim, PA-C   BP 139/62  Pulse 63  Temp(Src) 98.1 F (36.7 C) (Oral)  Resp 16  SpO2 100% Physical Exam  Nursing note and vitals reviewed. Constitutional: He is oriented to person, place, and time.  Chronically ill, NAD   HENT:  Head: Normocephalic.  Mouth/Throat: Oropharynx is clear and moist.  Small 1 cm posterior scalp laceration, small scalp hematoma   Eyes: Conjunctivae are normal. Pupils are  equal, round, and reactive to light.  Neck: Normal range of motion. Neck supple.  Cardiovascular: Normal rate, regular rhythm and normal heart sounds.   Pulmonary/Chest: Effort normal and breath sounds normal. No respiratory distress. He has no wheezes. He has no rales.  Abdominal: Soft. Bowel sounds are normal. He exhibits no distension. There is no tenderness. There is no rebound and no guarding.  Musculoskeletal: Normal range of motion. He exhibits no edema.  Pelvis stable. No obvious extremity trauma.   Neurological: He is alert and oriented to person, place, and time. No cranial nerve deficit.  Coordination normal.  Skin: Skin is warm and dry.  Psychiatric: He has a normal mood and affect. His behavior is normal. Judgment and thought content normal.    ED Course  Procedures (including critical care time)  LACERATION REPAIR Performed by: Wandra Arthurs Authorized by: Wandra Arthurs Consent: Verbal consent obtained. Risks and benefits: risks, benefits and alternatives were discussed Consent given by: patient Patient identity confirmed: provided demographic data Prepped and Draped in normal sterile fashion Wound explored  Laceration Location: posterior scalp  Laceration Length: 1 cm  No Foreign Bodies seen or palpated  Anesthesia: none   Irrigation method: syringe Amount of cleaning: standard  Skin closure: staples  Number of staples: 3   Patient tolerance: Patient tolerated the procedure well with no immediate complications.   Labs Review Labs Reviewed - No data to display  Imaging Review Ct Head Wo Contrast  09/02/2013   CLINICAL DATA:  Traumatic injury and pain  EXAM: CT HEAD WITHOUT CONTRAST  TECHNIQUE: Contiguous axial images were obtained from the base of the skull through the vertex without intravenous contrast.  COMPARISON:  04/24/2012  FINDINGS: The bony calvarium is intact. No gross soft tissue abnormality is. Mild atrophic changes are seen commensurate with the  patient's given age. Scattered areas of chronic white matter ischemic change are noted as well. No findings to suggest acute hemorrhage, acute infarction or space-occupying mass lesion are noted.  IMPRESSION: Chronic changes without acute abnormality.   Electronically Signed   By: Inez Catalina M.D.   On: 09/02/2013 09:29     EKG Interpretation None      MDM   Final diagnoses:  None   LUKIS BUNT is a 80 y.o. male here with posterior scalp laceration s/p fall. Since he is on coumadin, will get CT head.   10:10 AM CT head showed no bleed. Wound stapled. Stable for d/c.    Wandra Arthurs, MD 09/02/13 1010

## 2013-09-02 NOTE — ED Notes (Signed)
Patient transported to CT 

## 2013-09-02 NOTE — ED Notes (Signed)
Per MD, stapler at bedside.

## 2013-09-11 ENCOUNTER — Encounter (HOSPITAL_COMMUNITY): Payer: Self-pay | Admitting: Orthopedic Surgery

## 2013-09-11 NOTE — OR Nursing (Signed)
Procedure end added

## 2013-09-16 LAB — POCT INR: INR: 1.6

## 2013-09-17 ENCOUNTER — Ambulatory Visit (INDEPENDENT_AMBULATORY_CARE_PROVIDER_SITE_OTHER): Payer: Medicare Other | Admitting: Pharmacist Clinician (PhC)/ Clinical Pharmacy Specialist

## 2013-09-17 DIAGNOSIS — Z7901 Long term (current) use of anticoagulants: Secondary | ICD-10-CM

## 2013-09-17 DIAGNOSIS — I4891 Unspecified atrial fibrillation: Secondary | ICD-10-CM

## 2013-09-23 ENCOUNTER — Ambulatory Visit (INDEPENDENT_AMBULATORY_CARE_PROVIDER_SITE_OTHER): Payer: Medicare Other | Admitting: Pharmacist Clinician (PhC)/ Clinical Pharmacy Specialist

## 2013-09-23 DIAGNOSIS — Z7901 Long term (current) use of anticoagulants: Secondary | ICD-10-CM

## 2013-09-23 LAB — POCT INR: INR: 3.1

## 2013-09-30 LAB — POCT INR: INR: 2.8

## 2013-10-02 ENCOUNTER — Ambulatory Visit (INDEPENDENT_AMBULATORY_CARE_PROVIDER_SITE_OTHER): Payer: Medicare Other | Admitting: Pharmacist

## 2013-10-02 DIAGNOSIS — Z7901 Long term (current) use of anticoagulants: Secondary | ICD-10-CM

## 2013-10-02 DIAGNOSIS — I4891 Unspecified atrial fibrillation: Secondary | ICD-10-CM

## 2013-10-14 ENCOUNTER — Ambulatory Visit (INDEPENDENT_AMBULATORY_CARE_PROVIDER_SITE_OTHER): Payer: Medicare Other | Admitting: Pharmacist

## 2013-10-14 DIAGNOSIS — I4891 Unspecified atrial fibrillation: Secondary | ICD-10-CM

## 2013-10-14 DIAGNOSIS — Z7901 Long term (current) use of anticoagulants: Secondary | ICD-10-CM

## 2013-10-14 LAB — POCT INR: INR: 2.7

## 2013-11-02 ENCOUNTER — Other Ambulatory Visit: Payer: Self-pay | Admitting: Cardiology

## 2013-11-04 ENCOUNTER — Ambulatory Visit (INDEPENDENT_AMBULATORY_CARE_PROVIDER_SITE_OTHER): Payer: Medicare Other | Admitting: Pharmacist Clinician (PhC)/ Clinical Pharmacy Specialist

## 2013-11-04 ENCOUNTER — Other Ambulatory Visit: Payer: Self-pay | Admitting: Cardiology

## 2013-11-04 DIAGNOSIS — I4891 Unspecified atrial fibrillation: Secondary | ICD-10-CM

## 2013-11-04 DIAGNOSIS — Z7901 Long term (current) use of anticoagulants: Secondary | ICD-10-CM

## 2013-11-04 LAB — POCT INR: INR: 8

## 2013-11-06 ENCOUNTER — Telehealth: Payer: Self-pay | Admitting: Cardiology

## 2013-11-06 NOTE — Telephone Encounter (Signed)
New message    [facility] never received clearance to hold medication prior to surgery.  Please refax it to [phone]

## 2013-11-08 ENCOUNTER — Ambulatory Visit (INDEPENDENT_AMBULATORY_CARE_PROVIDER_SITE_OTHER): Payer: Medicare Other | Admitting: Pharmacist Clinician (PhC)/ Clinical Pharmacy Specialist

## 2013-11-08 ENCOUNTER — Telehealth: Payer: Self-pay | Admitting: Cardiology

## 2013-11-08 DIAGNOSIS — I4891 Unspecified atrial fibrillation: Secondary | ICD-10-CM

## 2013-11-08 DIAGNOSIS — Z7901 Long term (current) use of anticoagulants: Secondary | ICD-10-CM

## 2013-11-08 LAB — POCT INR: INR: 3.4

## 2013-11-08 NOTE — Telephone Encounter (Signed)
Gregory Werner is calling to follow on some medical clearance paperwork that was faxed to this office and would like to know if it was received. Please call And the fax # is [phone]  Thanks

## 2013-11-09 ENCOUNTER — Encounter: Payer: Self-pay | Admitting: *Deleted

## 2013-11-09 NOTE — Telephone Encounter (Signed)
Ok to hold coumadin for 5 days per Dr. Percival Spanish form letter faxed to Dr. Nelva Bush

## 2013-11-09 NOTE — Telephone Encounter (Signed)
Form letter faxed 11/09/13

## 2013-11-20 ENCOUNTER — Ambulatory Visit (INDEPENDENT_AMBULATORY_CARE_PROVIDER_SITE_OTHER): Payer: Medicare Other | Admitting: Pharmacist Clinician (PhC)/ Clinical Pharmacy Specialist

## 2013-11-20 DIAGNOSIS — Z7901 Long term (current) use of anticoagulants: Secondary | ICD-10-CM

## 2013-11-20 DIAGNOSIS — I4891 Unspecified atrial fibrillation: Secondary | ICD-10-CM

## 2013-11-20 LAB — POCT INR: INR: 1.1

## 2013-11-28 ENCOUNTER — Ambulatory Visit (INDEPENDENT_AMBULATORY_CARE_PROVIDER_SITE_OTHER): Payer: Medicare Other | Admitting: Podiatry

## 2013-11-28 ENCOUNTER — Ambulatory Visit (INDEPENDENT_AMBULATORY_CARE_PROVIDER_SITE_OTHER): Payer: Medicare Other

## 2013-11-28 ENCOUNTER — Encounter: Payer: Self-pay | Admitting: Podiatry

## 2013-11-28 VITALS — BP 112/68 | HR 68 | Resp 16 | Ht 70.0 in | Wt 150.0 lb

## 2013-11-28 DIAGNOSIS — L600 Ingrowing nail: Secondary | ICD-10-CM

## 2013-11-28 DIAGNOSIS — L03012 Cellulitis of left finger: Secondary | ICD-10-CM

## 2013-11-28 DIAGNOSIS — L97509 Non-pressure chronic ulcer of other part of unspecified foot with unspecified severity: Secondary | ICD-10-CM

## 2013-11-28 DIAGNOSIS — L97521 Non-pressure chronic ulcer of other part of left foot limited to breakdown of skin: Secondary | ICD-10-CM

## 2013-11-28 DIAGNOSIS — M79676 Pain in unspecified toe(s): Secondary | ICD-10-CM

## 2013-11-28 DIAGNOSIS — M79609 Pain in unspecified limb: Secondary | ICD-10-CM

## 2013-11-28 DIAGNOSIS — IMO0002 Reserved for concepts with insufficient information to code with codable children: Secondary | ICD-10-CM

## 2013-11-28 DIAGNOSIS — B351 Tinea unguium: Secondary | ICD-10-CM

## 2013-11-28 MED ORDER — CEPHALEXIN 500 MG PO CAPS: 500.0000 mg | ORAL_CAPSULE | Freq: Three times a day (TID) | ORAL | Status: DC

## 2013-11-28 NOTE — Progress Notes (Signed)
   Subjective:    Patient ID: Gregory Werner, male    DOB: 07/23/1924, 80 y.o.   MRN: [id number]  HPI Comments: Pt request debridement of 10 tender toenails. States that he tries to trim his nails at home however been on Coumadin he has noticed that he believes substantially because the nails too short. He denies any drainage from around the nails at home. Denies any systemic complaints such as fevers, chills, nausea, vomiting. He is also inquiring about shoe buying tips. No other complaints   Review of Systems  HENT: Positive for hearing loss.   Eyes: Positive for visual disturbance.  Musculoskeletal: Positive for gait problem.  Skin: Positive for rash.  Hematological: Bruises/bleeds easily.  All other systems reviewed and are negative.      Objective:   Physical Exam AAO x3, NAD  DP/PT pulses palpable b/l. CRT < 3sec Objective sensation intact the Semmes Weinstein monofilament, vibratory sensation intact. Nails hypertrophic, elongated, dystrophic, painful x10. Left hallux nail has significant ingrowing into both the medial and lateral borders of the distal aspect of the nail. Upon debridement of the nail there was noted to be a  collection of purulence within the medial border of the nail.  The nail was also loose and separated from the nail bed. The nail was sharply debrided to remove any loose nail. Upon debridement no further purulence was noted to be tracking in the proximal aspect of the nail and seems to be localized to the distal aspect. There is also a  granular ulceration over the distal medial aspect of the nail bed after the nail was removed likely from pressure of the nail. The wound had a small amount of probing, however it did not PTB. No tunneling or undermining. There is no surrounding erythema, ascending cellulitis, fluctuance or crepitus. ROM WNL     Assessment & Plan:  80 year old male with symptomatic onychomycosis with paronychia/wound of left medial nail border of  the hallux -X-rays were obtained. See x-ray report for full details. There is a questionable area on the distal aspect of the hallux however this may just be chronic change. Will obtain new x-ray in about 2 weeks to ensure no osteomyelitis.  -Conservative versus surgical intervention of the paronychia was discussed with the patient in detail including alternatives, risks, complications. -At this time elects to proceed with removal of the loose nail without any invasive procedure. Left hallux nail sharply debrided of all loose nail as well as incurvated borders. The purulence was noted to be at the distal medial aspect of the nail. After debridement of the nail there is found to be a  granular ulceration at the distal medial aspect of the nail bed.  -Betadine or epsom salt soaks followed by antibiotic ointment/dressing. -Keflex  -Followup in one week or sooner if any palms are to arise.  -Remaining nails sharply debrided without complications. -Discussed clinical signs and symptoms of infection. Patient directed to call the office immediately if any are to occur or go directly to the emergency room.

## 2013-11-28 NOTE — Patient Instructions (Signed)

## 2013-11-29 ENCOUNTER — Encounter: Payer: Self-pay | Admitting: Podiatry

## 2013-12-02 ENCOUNTER — Telehealth: Payer: Self-pay | Admitting: Podiatry

## 2013-12-02 NOTE — Telephone Encounter (Signed)
Called patient to follow up with him regarding the infection in his toe. He denies any fevers, chills, nausea, vomiting. No purulence, drainage. No ascending cellulitis. He continues with BID soaks, antibiotic  Follow up as scheduled, or sooner if there are any questions/concerns. Monitor for any worsening signs/symptoms of infection. Call with questions.

## 2013-12-06 ENCOUNTER — Encounter: Payer: Self-pay | Admitting: Podiatry

## 2013-12-06 ENCOUNTER — Ambulatory Visit (INDEPENDENT_AMBULATORY_CARE_PROVIDER_SITE_OTHER): Payer: Medicare Other | Admitting: Pharmacist Clinician (PhC)/ Clinical Pharmacy Specialist

## 2013-12-06 ENCOUNTER — Ambulatory Visit: Payer: Medicare Other | Admitting: Podiatry

## 2013-12-06 VITALS — BP 145/81 | HR 60 | Resp 18

## 2013-12-06 DIAGNOSIS — L03039 Cellulitis of unspecified toe: Secondary | ICD-10-CM

## 2013-12-06 DIAGNOSIS — L02818 Cutaneous abscess of other sites: Secondary | ICD-10-CM

## 2013-12-06 DIAGNOSIS — L03012 Cellulitis of left finger: Secondary | ICD-10-CM

## 2013-12-06 DIAGNOSIS — Z7901 Long term (current) use of anticoagulants: Secondary | ICD-10-CM

## 2013-12-06 DIAGNOSIS — I4891 Unspecified atrial fibrillation: Secondary | ICD-10-CM

## 2013-12-06 DIAGNOSIS — L03818 Cellulitis of other sites: Secondary | ICD-10-CM

## 2013-12-06 LAB — POCT INR: INR: 2

## 2013-12-06 NOTE — Progress Notes (Signed)
   Subjective:    Patient ID: Gregory Werner, male    DOB: 03-14-1925, 80 y.o.   MRN: [id number]  HPI  80 year old male returns the office today for followup evaluation of left hallux toenail. Upon routine debridement at last appointment there is purulence noted from underneath the nail. At that time the nail was debrided. Patient has been soaking his foot daily as well as applying antibiotic ointment and a Band-Aid. He states that he had noticed a little bit of redness around the area however states that it is doing better at this time. Denies any purulence from around the area, denies any pain. Denies any systemic complaints such as fevers, chills, nausea, vomiting. He has been taking antibiotics as directed without any side effects.  Review of Systems  All other systems reviewed and are negative.      Objective:   Physical Exam AAO x3,NAD DP/PT pulses palpable. CRt < 3sec Protective sensation intact with Simms Weinstein monofilament, vibratory sensation intact to Left hallux nail site without any evidence of erythema, drainage, purulence. No ascending cellulitis, fluctuance, crepitus. The remaining nail was loose. Upon further debridement of the nail no further purulence or signs of infection noted. Underlying nail bed granular.  No leg pain, swelling, warmth.       Assessment & Plan:  80 year old male followup of left hallux paronychia. -Conservative versus surgical intervention was discussed with the patient in detail including alternatives, risks, complications.  -The remaining loose nail sharply debrided without complications. There is only a small portion of the proximal nail remaining. Discussed the patient was may fall off.  -There is no evidence of purulence or clinical signs of infection. The underlying nail bed is granular.  -Continue with Epsom salts soaks twice a day followed by a antibiotic ointment and a Band-Aid.  -Followup in 2 weeks to ensure healing. Monitor for any  signs or symptoms of infection and directed to call the office immediately or go directly to the emergency room if any are to occur. Call with any questions or concerns or change in symptoms.

## 2013-12-06 NOTE — Patient Instructions (Signed)
Continue with daily soaks, antibiotic ointment/band-aid  Monitor for any signs/symptoms of infection. Call the office immediately if any occur or go directly to the emergency room. Call with any questions/concerns.

## 2013-12-08 ENCOUNTER — Encounter: Payer: Self-pay | Admitting: Podiatry

## 2013-12-20 ENCOUNTER — Ambulatory Visit (INDEPENDENT_AMBULATORY_CARE_PROVIDER_SITE_OTHER): Payer: Medicare Other | Admitting: Podiatry

## 2013-12-20 ENCOUNTER — Encounter: Payer: Self-pay | Admitting: Podiatry

## 2013-12-20 VITALS — BP 135/73 | HR 59 | Resp 18

## 2013-12-20 DIAGNOSIS — L03012 Cellulitis of left finger: Secondary | ICD-10-CM

## 2013-12-20 DIAGNOSIS — IMO0002 Reserved for concepts with insufficient information to code with codable children: Secondary | ICD-10-CM

## 2013-12-20 DIAGNOSIS — B351 Tinea unguium: Secondary | ICD-10-CM

## 2013-12-20 IMAGING — CR DG SHOULDER 1V*L*
2 series · 2 of 2 positions shown · non-contrast
Comparison: Portable film earlier today.

CLINICAL DATA: Post reduction

PORTABLE LEFT SHOULDER - 2+ VIEW

[AP (1 of 2)]
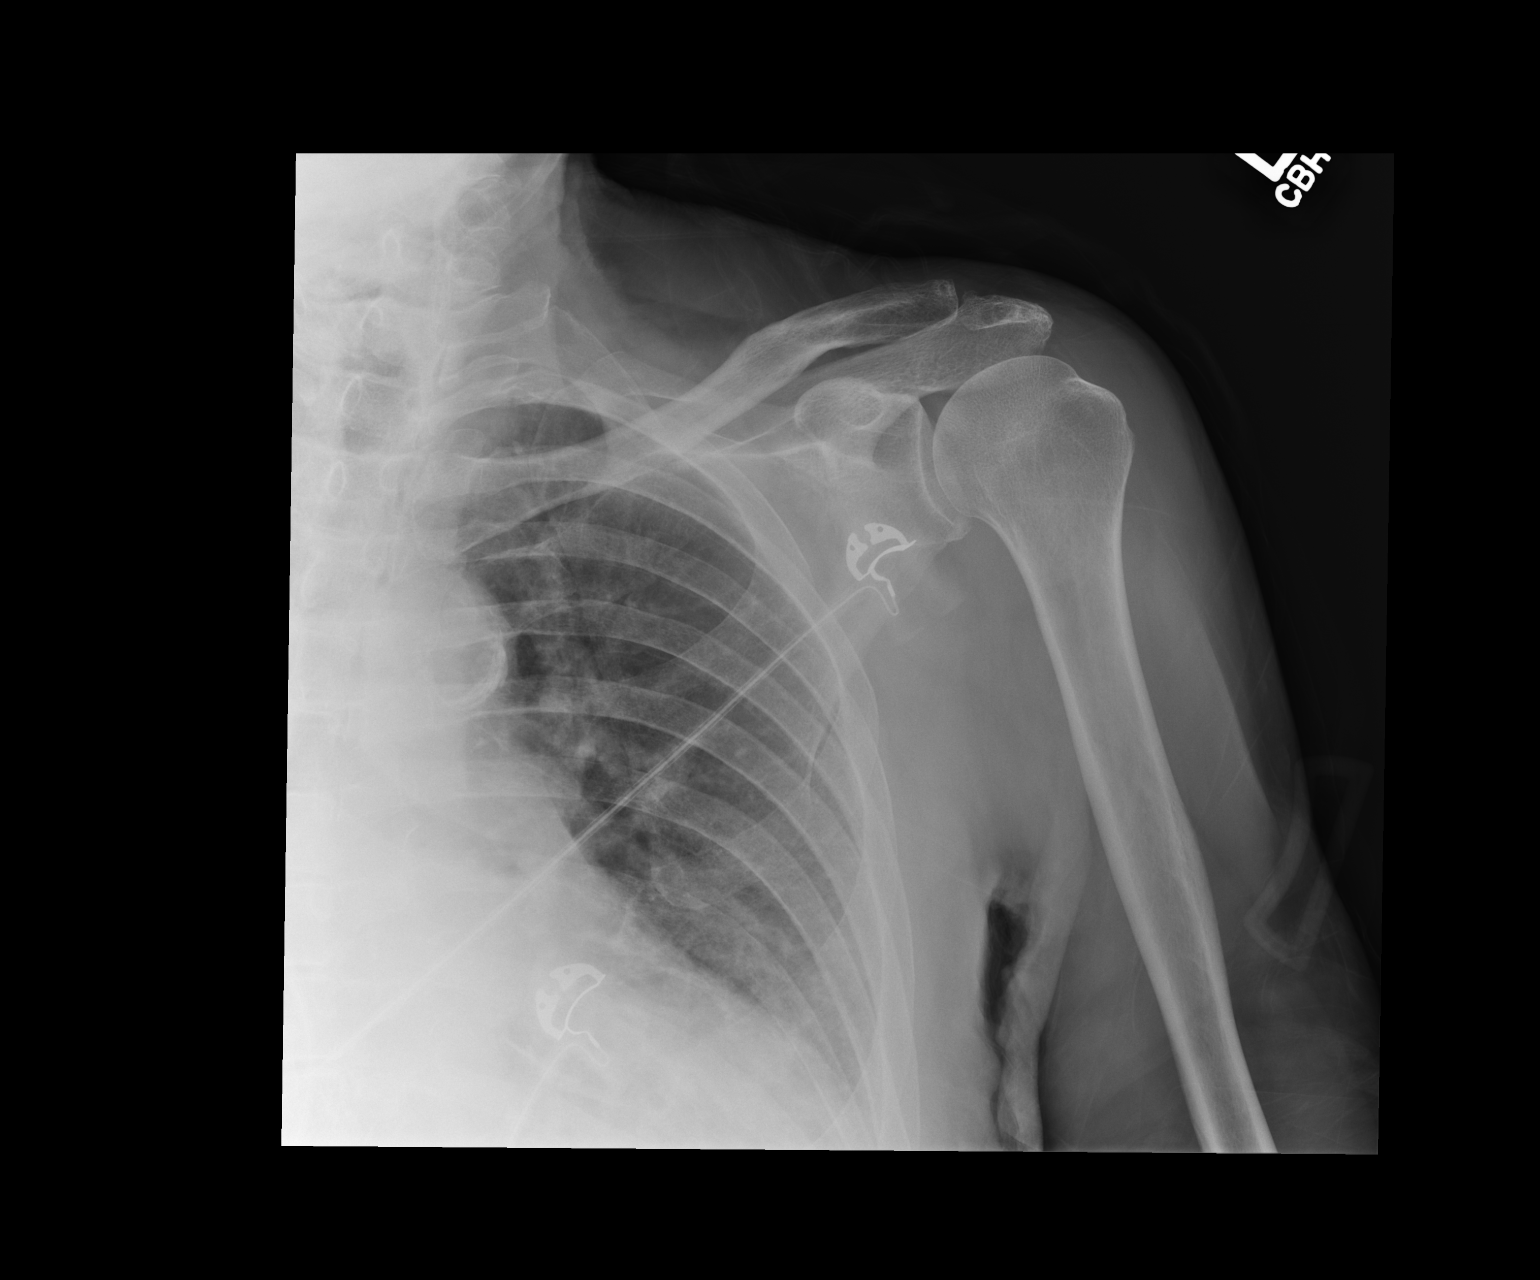

[AP (2 of 2)]
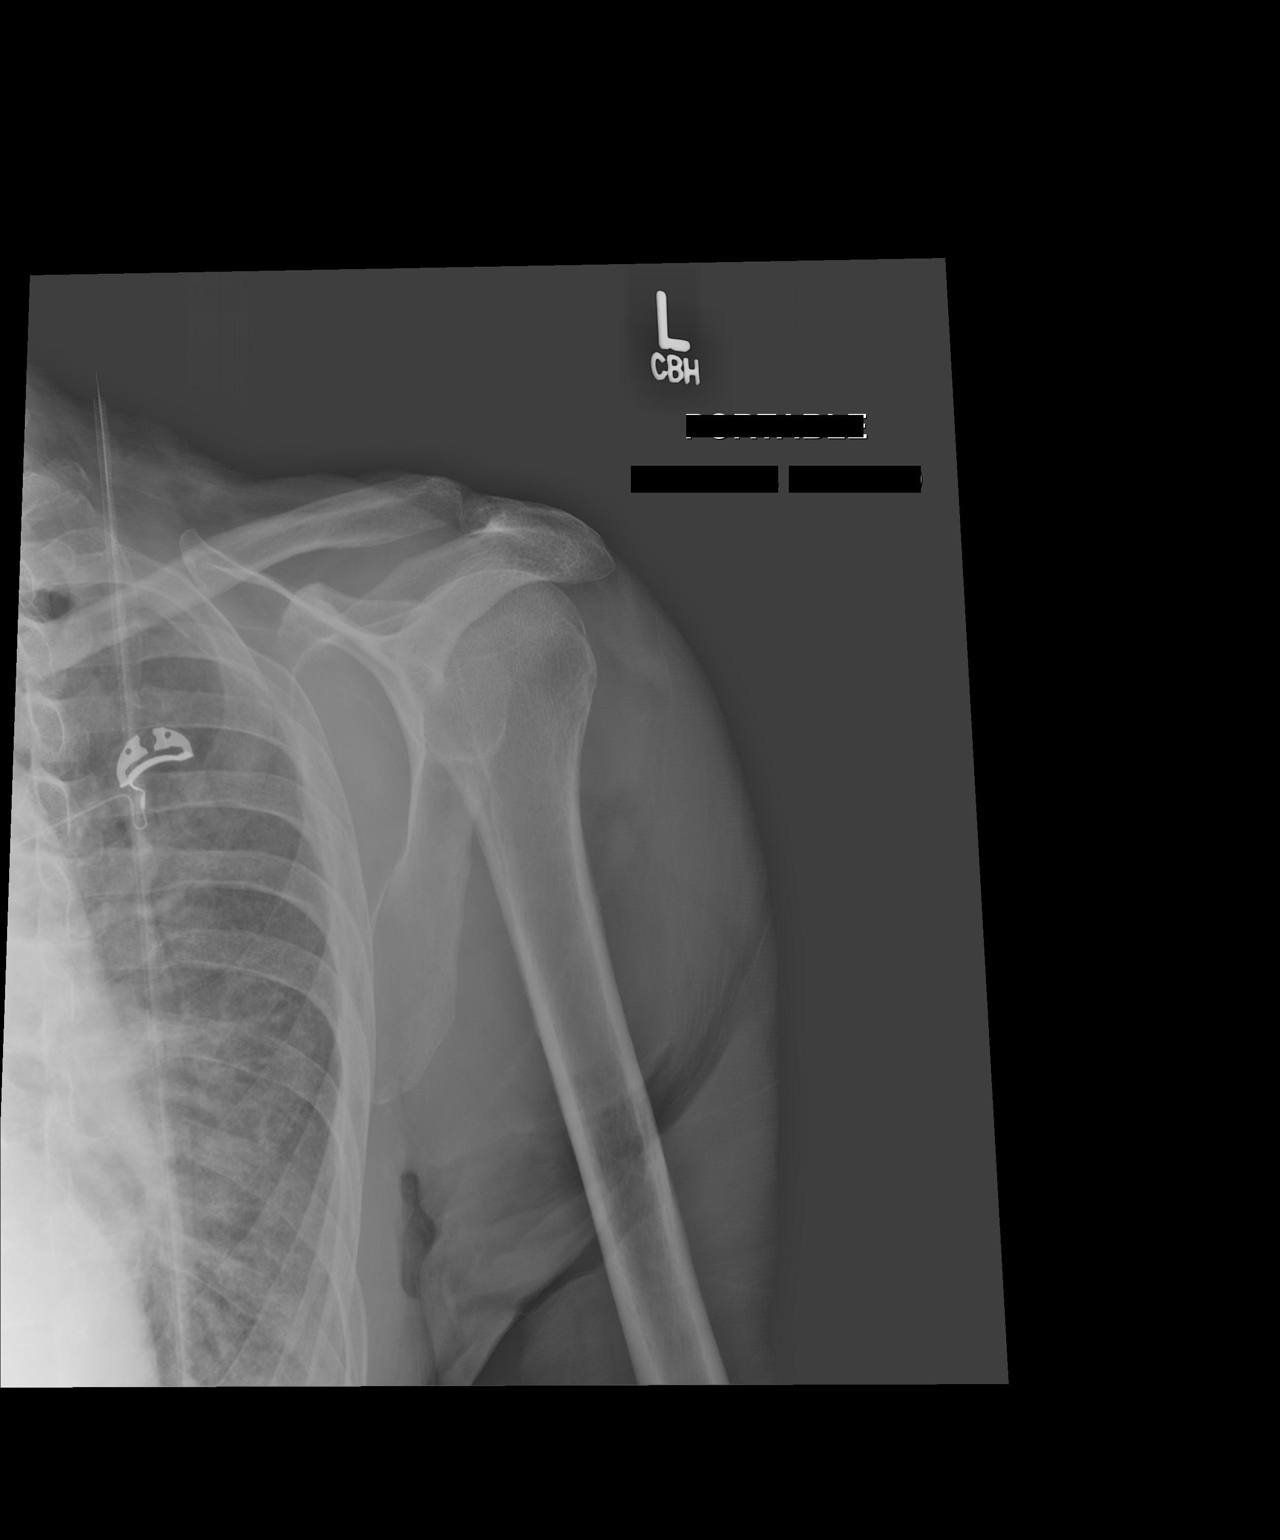

[2 of 2 positions shown; findings below may reference images not displayed]

FINDINGS: The anterior inferior shoulder dislocation has been
reduced.  There is no visible Anjum or Hill-Sachs deformity.
IMPRESSION: Satisfactory postreduction appearance.

## 2013-12-20 NOTE — Progress Notes (Signed)
   Subjective:    Patient ID: Gregory Werner, male    DOB: 1925-04-02, 80 y.o.   MRN: [id number]  HPI  Gregory Werner returns the office they for followup evaluation of left hallux nail infection. He's been continuing with Epson salt soaks. Denies any recent drainage, erythema, pain. Denies any systemic complaints such as fevers, chills, nausea, vomiting. Patient also was inquiring about nail fungus. States that he started using "fungi-cure" a few weeks ago. New complaints at this time. No acute changes since last appointment.    Review of Systems  All other systems reviewed and are negative.      Objective:   Physical Exam AAO x3, NAD DP/PT pulses palpable b/l. CRT < 3sec Neurological status unchanged. Left hallux nail site has completely healed at this time. Is no purulence, edema, pain, erythema at this time.  Nails hypertrophic, dystrophic, elongated, brittle x10. No surrounding erythema or drainage. No open skin lesions. No leg pain, swelling, warmth.       Assessment & Plan:  80 year old male with resolved left hallux paronychia; onychomycosis -Various treatment options were discussed including alternatives, risks, complications. -At this time the left hallux nail site has completely healed. He can discontinue soaking. -Continue Fungi-Nail for treatment of onychomycosis. Discussed that he's uses for up to one year to see optimal results. -Monitor for any clinical signs or symptoms of infection and directed to call the office immediately if any are to occur to call the office immediately or go directly to the emergency room. -Followup in 2 months for nail debridement. Call with any questions or concerns or change in symptoms in the meantime.

## 2013-12-20 NOTE — Patient Instructions (Signed)
Monitor for any signs/symptoms of infection. Call the office immediately if any occur or go directly to the emergency room. Call with any questions/concerns.  

## 2014-01-03 ENCOUNTER — Ambulatory Visit (INDEPENDENT_AMBULATORY_CARE_PROVIDER_SITE_OTHER): Payer: Medicare Other | Admitting: Pharmacist Clinician (PhC)/ Clinical Pharmacy Specialist

## 2014-01-03 DIAGNOSIS — Z7901 Long term (current) use of anticoagulants: Secondary | ICD-10-CM

## 2014-01-03 DIAGNOSIS — I4891 Unspecified atrial fibrillation: Secondary | ICD-10-CM

## 2014-01-03 LAB — POCT INR: INR: 2.3

## 2014-01-31 ENCOUNTER — Ambulatory Visit (INDEPENDENT_AMBULATORY_CARE_PROVIDER_SITE_OTHER): Payer: Medicare Other | Admitting: Pharmacist Clinician (PhC)/ Clinical Pharmacy Specialist

## 2014-01-31 DIAGNOSIS — I4891 Unspecified atrial fibrillation: Secondary | ICD-10-CM

## 2014-01-31 DIAGNOSIS — Z7901 Long term (current) use of anticoagulants: Secondary | ICD-10-CM

## 2014-01-31 LAB — POCT INR: INR: 1.5

## 2014-02-17 ENCOUNTER — Ambulatory Visit (INDEPENDENT_AMBULATORY_CARE_PROVIDER_SITE_OTHER): Payer: Medicare Other | Admitting: Pharmacist Clinician (PhC)/ Clinical Pharmacy Specialist

## 2014-02-17 DIAGNOSIS — Z7901 Long term (current) use of anticoagulants: Secondary | ICD-10-CM

## 2014-02-17 DIAGNOSIS — I4891 Unspecified atrial fibrillation: Secondary | ICD-10-CM

## 2014-02-17 LAB — POCT INR: INR: 2.2

## 2014-02-21 ENCOUNTER — Ambulatory Visit (INDEPENDENT_AMBULATORY_CARE_PROVIDER_SITE_OTHER): Payer: Medicare Other | Admitting: Podiatry

## 2014-02-21 ENCOUNTER — Encounter: Payer: Self-pay | Admitting: Podiatry

## 2014-02-21 VITALS — BP 158/86 | HR 56 | Resp 14

## 2014-02-21 DIAGNOSIS — B351 Tinea unguium: Secondary | ICD-10-CM

## 2014-02-21 DIAGNOSIS — M79676 Pain in unspecified toe(s): Secondary | ICD-10-CM

## 2014-02-24 ENCOUNTER — Encounter: Payer: Self-pay | Admitting: Podiatry

## 2014-02-24 NOTE — Progress Notes (Signed)
Patient ID: Dwain Sarna, male   DOB: [birth year], 80 y.o.   MRN: [id number]  Subjective: 80 year old male returns the office today for painful elongated toenails. He states that he has pain particularly when wearing shoes. He denies any recent redness or drainage from the nail sites. He states that since last appointment he has had no problems to the left big toe and has had no drainage or any pain. No acute changes since last appointment. No other complaints at this time. Denies any systemic complaints such as fevers, chills, nausea, vomiting.  Objective: AAO 3, NAD DP/PT pulses palpable bilaterally, CRT less than 3 seconds Protective sensation intact with Simms Weinstein monofilament, vibratory sensation intact.  Nails are trophic, dystrophic, elongated, brittle, discolored 10. No significant ingrowing of the toenails. There is no surrounding erythema or drainage. No open lesions. No pain with calf compression, swelling, warmth, erythema.  Assessment: 80 year old male with syndesmotic onychomycosis.  Plan: -Treatment options discussed including alternatives, risks, complications. -Nail sharply debrided 10 without complications. No clinical signs of infection. -Discussed the importance of daily foot inspection. - Follow-up in 3 months. In the meantime, call the office with any questions, concerns, change in symptoms.

## 2014-03-19 ENCOUNTER — Ambulatory Visit (INDEPENDENT_AMBULATORY_CARE_PROVIDER_SITE_OTHER): Payer: Medicare Other | Admitting: Pharmacist Clinician (PhC)/ Clinical Pharmacy Specialist

## 2014-03-19 DIAGNOSIS — I4891 Unspecified atrial fibrillation: Secondary | ICD-10-CM

## 2014-03-19 DIAGNOSIS — Z7901 Long term (current) use of anticoagulants: Secondary | ICD-10-CM

## 2014-03-19 LAB — POCT INR: INR: 2.1

## 2014-04-28 ENCOUNTER — Ambulatory Visit: Payer: Medicare Other | Admitting: Pharmacist Clinician (PhC)/ Clinical Pharmacy Specialist

## 2014-05-02 ENCOUNTER — Ambulatory Visit (INDEPENDENT_AMBULATORY_CARE_PROVIDER_SITE_OTHER): Payer: Medicare Other | Admitting: Pharmacist Clinician (PhC)/ Clinical Pharmacy Specialist

## 2014-05-02 DIAGNOSIS — Z7901 Long term (current) use of anticoagulants: Secondary | ICD-10-CM

## 2014-05-02 DIAGNOSIS — I4891 Unspecified atrial fibrillation: Secondary | ICD-10-CM

## 2014-05-02 LAB — POCT INR: INR: 2.1

## 2014-05-23 ENCOUNTER — Ambulatory Visit (INDEPENDENT_AMBULATORY_CARE_PROVIDER_SITE_OTHER): Payer: Medicare Other | Admitting: Podiatry

## 2014-05-23 ENCOUNTER — Encounter: Payer: Self-pay | Admitting: Podiatry

## 2014-05-23 DIAGNOSIS — B351 Tinea unguium: Secondary | ICD-10-CM

## 2014-05-23 DIAGNOSIS — M79676 Pain in unspecified toe(s): Secondary | ICD-10-CM

## 2014-05-23 NOTE — Patient Instructions (Signed)
Diabetes and Foot Care Diabetes may cause you to have problems because of poor blood supply (circulation) to your feet and legs. This may cause the skin on your feet to become thinner, break easier, and heal more slowly. Your skin may become dry, and the skin may peel and crack. You may also have nerve damage in your legs and feet causing decreased feeling in them. You may not notice minor injuries to your feet that could lead to infections or more serious problems. Taking care of your feet is one of the most important things you can do for yourself.  HOME CARE INSTRUCTIONS  Wear shoes at all times, even in the house. Do not go barefoot. Bare feet are easily injured.  Check your feet daily for blisters, cuts, and redness. If you cannot see the bottom of your feet, use a mirror or ask someone for help.  Wash your feet with warm water (do not use hot water) and mild soap. Then pat your feet and the areas between your toes until they are completely dry. Do not soak your feet as this can dry your skin.  Apply a moisturizing lotion or petroleum jelly (that does not contain alcohol and is unscented) to the skin on your feet and to dry, brittle toenails. Do not apply lotion between your toes.  Trim your toenails straight across. Do not dig under them or around the cuticle. File the edges of your nails with an emery board or nail file.  Do not cut corns or calluses or try to remove them with medicine.  Wear clean socks or stockings every day. Make sure they are not too tight. Do not wear knee-high stockings since they may decrease blood flow to your legs.  Wear shoes that fit properly and have enough cushioning. To break in new shoes, wear them for just a few hours a day. This prevents you from injuring your feet. Always look in your shoes before you put them on to be sure there are no objects inside.  Do not cross your legs. This may decrease the blood flow to your feet.  If you find a minor scrape,  cut, or break in the skin on your feet, keep it and the skin around it clean and dry. These areas may be cleansed with mild soap and water. Do not cleanse the area with peroxide, alcohol, or iodine.  When you remove an adhesive bandage, be sure not to damage the skin around it.  If you have a wound, look at it several times a day to make sure it is healing.  Do not use heating pads or hot water bottles. They may burn your skin. If you have lost feeling in your feet or legs, you may not know it is happening until it is too late.  Make sure your health care provider performs a complete foot exam at least annually or more often if you have foot problems. Report any cuts, sores, or bruises to your health care provider immediately. SEEK MEDICAL CARE IF:   You have an injury that is not healing.  You have cuts or breaks in the skin.  You have an ingrown nail.  You notice redness on your legs or feet.  You feel burning or tingling in your legs or feet.  You have pain or cramps in your legs and feet.  Your legs or feet are numb.  Your feet always feel cold. SEEK IMMEDIATE MEDICAL CARE IF:   There is increasing redness,   swelling, or pain in or around a wound.  There is a red line that goes up your leg.  Pus is coming from a wound.  You develop a fever or as directed by your health care provider.  You notice a bad smell coming from an ulcer or wound. Document Released: 03/18/2000 Document Revised: 11/21/2012 Document Reviewed: 08/28/2012 Exit[employer] Patient Information 2015 [employer]. This information is not intended to replace advice given to you by your health care provider. Make sure you discuss any questions you have with your health care provider.  

## 2014-05-26 NOTE — Progress Notes (Signed)
Patient ID: Gregory Werner, male   DOB: [birth year], 81 y.o.   MRN: [id number]  Subjective: 81 year old male returns the office today for painful, elongated, thickened toenails. Denies any redness or drainage around the nails. Denies any acute changes since last appointment and no new complaints today. Denies any systemic complaints such as fevers, chills, nausea, vomiting.   Objective: AAO 3, NAD DP/PT pulses palpable, CRT less than 3 seconds Protective sensation intact with Simms Weinstein monofilament, Achilles tendon reflex intact.  Nails hypertrophic, dystrophic, elongated, brittle, discolored 10. There is tenderness overlying these nails. There is no surrounding erythema or drainage along the nail sites. No open lesions or pre-ulcerative lesions are identified. No other areas of tenderness bilateral lower extremities. No overlying edema, erythema, increased warmth. No pain with calf compression, swelling, warmth, erythema.  Assessment: Patient presents with symptomatic onychomycosis  Plan: -Treatment options including alternatives, risks, complications were discussed -Nails sharply debrided 10 without complication/bleeding. -Discussed daily foot inspection. If there are any changes, to call the office immediately.  -Follow-up in 3 months or sooner if any problems are to arise. In the meantime, encouraged to call the office with any questions, concerns, changes symptoms.

## 2014-06-03 ENCOUNTER — Ambulatory Visit (INDEPENDENT_AMBULATORY_CARE_PROVIDER_SITE_OTHER): Payer: Medicare Other | Admitting: Cardiology

## 2014-06-03 ENCOUNTER — Other Ambulatory Visit: Payer: Self-pay | Admitting: Cardiology

## 2014-06-03 ENCOUNTER — Encounter: Payer: Self-pay | Admitting: Cardiology

## 2014-06-03 VITALS — BP 170/80 | HR 51 | Ht 70.0 in | Wt 163.4 lb

## 2014-06-03 DIAGNOSIS — R0602 Shortness of breath: Secondary | ICD-10-CM

## 2014-06-03 NOTE — Patient Instructions (Addendum)
Your physician recommends that you schedule a follow-up appointment in: one year with with Dr. Lacey Jensen up coming carotid dopplers and don't reshedule  Do a bp diary for 2 weeks and bring back

## 2014-06-03 NOTE — Progress Notes (Signed)
HPI The patient presents for follow up of atrial fib.  The patient denies any new symptoms such as chest discomfort, neck or arm discomfort. There has been no new shortness of breath, PND or orthopnea. There have been no reported palpitations, presyncope or syncope.  He does not notice his atrial fib.  He tolerates the blood thinner.   He did have knee replacement since I last saw him and he did well with this. He does some light exercising walking in his house and also working out at [employer].  No Known Allergies  Current Outpatient Prescriptions  Medication Sig Dispense Refill  . acetaminophen (TYLENOL) 500 MG tablet Take 500 mg by mouth every 6 (six) hours as needed (Pain).    . calcium carbonate 200 MG capsule Take 800 mg by mouth daily.    . celecoxib (CELEBREX) 200 MG capsule Take 200 mg by mouth daily.  2  . cholecalciferol (VITAMIN D) 1000 UNITS tablet Take 1,000 Units by mouth daily.    . Cyanocobalamin (VITAMIN B 12 PO) Take 1,000 mg by mouth daily.    . famotidine (PEPCID AC) 10 MG chewable tablet Chew 10 mg by mouth at bedtime.    . famotidine (PEPCID) 20 MG tablet Take 20 mg by mouth daily.     . Glucosamine-Chondroit-Vit C-Mn (GLUCOSAMINE 1500 COMPLEX) CAPS Take 1 capsule by mouth daily.    Marland Kitchen ibandronate (BONIVA) 150 MG tablet Take 150 mg by mouth every 30 (thirty) days. Take in the morning with a full glass of water, on an empty stomach, and do not take anything else by mouth or lie down for the next 30 min.    . Multiple Vitamins-Minerals (VISION-VITE PRESERVE PO) Take 2 capsules by mouth daily.    Marland Kitchen MYRBETRIQ 50 MG TB24 Take 1 tablet by mouth daily. Takes at PM    . Omega-3 Fatty Acids (FISH OIL) 1200 MG CAPS Take 1 capsule by mouth daily.    . ramipril (ALTACE) 2.5 MG capsule TAKE 1 CAPSULE BY MOUTH EVERY DAY 90 capsule 1  . terazosin (HYTRIN) 2 MG capsule Take 2 mg by mouth at bedtime.      . vitamin C (ASCORBIC ACID) 500 MG tablet Take 500 mg by mouth daily.    Marland Kitchen  warfarin (COUMADIN) 5 MG tablet TAKE AS DIRECTED BY ANTICOAGULATION CLINIC 35 tablet 3   No current facility-administered medications for this visit.    Past Medical History  Diagnosis Date  . Atrial fibrillation   . Hypertension     benign  . Hyperlipidemia   . GERD (gastroesophageal reflux disease)   . Lung infiltrate 05/2010    left lower lobe infiltrate, questionable pneumonia  . Dysphagia 2001    history s/p esophageal dilatation  . Diverticulosis     history  . Urinary frequency     history  . Arthritis   . Lower leg edema     bilateral   . Hearing loss     bilateral hearing aids  . Dysrhythmia     hx. Atrial Fibrillation, RBBB  . Prostate cancer 2001    treated with radiation  . Prostate cancer   . Skin cancer     Past Surgical History  Procedure Laterality Date  . Esophageal dilation  2001  . Tonsillectomy    . Cataract extraction  2008  . Replacement total knee  2012    left  . Eye surgery    . Prostate surgery    .  Vasectomy    . Joint replacement      LTKA  . Total knee arthroplasty Right 08/19/2013    Procedure: RIGHT TOTAL KNEE ARTHROPLASTY;  Surgeon: Gearlean Alf, MD;  Location: [facility];  Service: Orthopedics;  Laterality: Right;    ROS:  As stated in the HPI and negative for all other systems.  PHYSICAL EXAM BP 170/80 mmHg  Pulse 51  Ht 5\' 10"  (1.778 m)  Wt 163 lb 6.4 oz (74.118 kg)  BMI 23.45 kg/m2 GENERAL:  Well appearing but slight.   NECK:  No jugular venous distention, waveform within normal limits, carotid upstroke brisk and symmetric, slight right bruit, no thyromegaly LUNGS:  Clear to auscultation bilaterally, lordosis BACK:  No CVA tenderness CHEST:  Unremarkable HEART:  PMI not displaced or sustained,S1 and S2 within normal limits, no S3,  no clicks, no rubs, slight early peaking systolic, no diastolic murmurs, irregular ABD:  Flat, positive bowel sounds normal in frequency in pitch, no bruits, no rebound, no guarding, no midline  pulsatile mass, no hepatomegaly, no splenomegaly EXT:  2 plus pulses throughout, mild edema, no cyanosis no clubbing, chronic venous stasis changes.     EKG:  Atrial fibrillation, rate 51, leftward axis, poor anterior R wave progression, premature ectopic complexes, no acute ST-T wave changes.  06/03/2014   ASSESSMENT AND PLAN  ATRIAL FIB:  The patient  tolerates this rhythm and rate control and anticoagulation. We will continue with the meds as listed.  The patient is not interested in switching to a novel oral anticoagulant.    CAROTID BRUIT:  He had very mild stenosis and no follow-up is needed this year. I will consider repeat Dopplers in 2017.  HTN:  His blood pressure is slightly elevated but he will keep a blood pressure diary and we will make changes as needed.

## 2014-06-05 ENCOUNTER — Telehealth: Payer: Self-pay | Admitting: Cardiology

## 2014-06-05 NOTE — Telephone Encounter (Signed)
Forward to Dr Percival Spanish AND Lavella Hammock [facility] LPN

## 2014-06-05 NOTE — Telephone Encounter (Signed)
Pt wants Dr Warren Lacy to know his blood pressure is still up. The last time time he took it today it was 187/67.Pt just wanted Dr Warren Lacy to be aware of this.He does not need a phone call unless he wants to change his medicine.

## 2014-06-05 NOTE — Telephone Encounter (Signed)
Increase Altace to 5 mg daily and repeat BMET in 10 days.

## 2014-06-06 ENCOUNTER — Encounter: Payer: Self-pay | Admitting: Cardiology

## 2014-06-06 NOTE — Telephone Encounter (Signed)
Pt called in stating that last night his BP got up to 199/106 and he stated that he took an extra ramapril tab last night in addition to his regular dosage to bring down his BP. It seemed to help so he wanted to know if Dr. Percival Spanish would like to increase his dosage of Ramapril. Please call  Thanks

## 2014-06-06 NOTE — Telephone Encounter (Signed)
Pt calling back again,said he sent an E Mail also. Please call him today,if possible.

## 2014-06-08 ENCOUNTER — Encounter: Payer: Self-pay | Admitting: Cardiology

## 2014-06-08 DIAGNOSIS — I10 Essential (primary) hypertension: Secondary | ICD-10-CM

## 2014-06-09 ENCOUNTER — Other Ambulatory Visit: Payer: Self-pay

## 2014-06-09 MED ORDER — RAMIPRIL 5 MG PO CAPS: 5.0000 mg | ORAL_CAPSULE | Freq: Every day | ORAL | Status: DC

## 2014-06-09 NOTE — Telephone Encounter (Signed)
Received a call from patient he stated he called last week about elevated B/P and no one returned his call.Advised on 06/05/14 message sent to Dr.Hochrein and he advised to increase Altace to 5 mg daily.Bmet in 10 days.Stated he already increased Altace to 5 mg daily on 06/06/14.B/P today 141/81.Advised he is scheduled for INR 06/13/14,advised have B/P checked too.Advised to have Bmet 06/16/14 at [facility] lab.

## 2014-06-11 NOTE — Telephone Encounter (Signed)
Pt. Called and let me know he had increased his altace and would get his bmp done on monday

## 2014-06-13 ENCOUNTER — Ambulatory Visit (INDEPENDENT_AMBULATORY_CARE_PROVIDER_SITE_OTHER): Payer: Medicare Other | Admitting: Pharmacist Clinician (PhC)/ Clinical Pharmacy Specialist

## 2014-06-13 DIAGNOSIS — Z7901 Long term (current) use of anticoagulants: Secondary | ICD-10-CM

## 2014-06-13 DIAGNOSIS — I4891 Unspecified atrial fibrillation: Secondary | ICD-10-CM

## 2014-06-13 LAB — POCT INR: INR: 1.6

## 2014-06-13 NOTE — Patient Instructions (Signed)
HOW TO TAKE YOUR BLOOD PRESSURE: . Rest 5 minutes before taking your blood pressure. .  Don't smoke or drink caffeinated beverages for at least 30 minutes before. . Take your blood pressure before (not after) you eat. . Sit comfortably with your back supported and both feet on the floor (don't cross your legs). . Elevate your arm to heart level on a table or a desk. . Use the proper sized cuff. It should fit smoothly and snugly around your bare upper arm. There should be enough room to slip a fingertip under the cuff. The bottom edge of the cuff should be 1 inch above the crease of the elbow. . Ideally, take 3 measurements at one sitting and record the average.

## 2014-06-17 LAB — BASIC METABOLIC PANEL
BUN: 19 mg/dL (ref 6–23)
CO2: 28 mEq/L (ref 19–32)
Calcium: 9.1 mg/dL (ref 8.4–10.5)
Chloride: 99 mEq/L (ref 96–112)
Creat: 0.92 mg/dL (ref 0.50–1.35)
GLUCOSE: 94 mg/dL (ref 70–99)
POTASSIUM: 4.7 meq/L (ref 3.5–5.3)
Sodium: 135 mEq/L (ref 135–145)

## 2014-06-25 ENCOUNTER — Encounter: Payer: Self-pay | Admitting: Cardiology

## 2014-06-27 ENCOUNTER — Encounter: Payer: Self-pay | Admitting: Cardiology

## 2014-07-04 ENCOUNTER — Ambulatory Visit (INDEPENDENT_AMBULATORY_CARE_PROVIDER_SITE_OTHER): Payer: Medicare Other | Admitting: Pharmacist Clinician (PhC)/ Clinical Pharmacy Specialist

## 2014-07-04 DIAGNOSIS — Z7901 Long term (current) use of anticoagulants: Secondary | ICD-10-CM

## 2014-07-04 DIAGNOSIS — I4891 Unspecified atrial fibrillation: Secondary | ICD-10-CM | POA: Diagnosis not present

## 2014-07-04 LAB — POCT INR: INR: 2

## 2014-07-21 ENCOUNTER — Encounter: Payer: Self-pay | Admitting: Cardiology

## 2014-07-22 ENCOUNTER — Other Ambulatory Visit: Payer: Self-pay | Admitting: *Deleted

## 2014-07-22 MED ORDER — RAMIPRIL 5 MG PO CAPS: 10.0000 mg | ORAL_CAPSULE | Freq: Every day | ORAL | Status: DC

## 2014-07-22 NOTE — Telephone Encounter (Signed)
Filled per request

## 2014-08-01 ENCOUNTER — Ambulatory Visit (INDEPENDENT_AMBULATORY_CARE_PROVIDER_SITE_OTHER): Payer: Medicare Other | Admitting: Pharmacist Clinician (PhC)/ Clinical Pharmacy Specialist

## 2014-08-01 DIAGNOSIS — I4891 Unspecified atrial fibrillation: Secondary | ICD-10-CM | POA: Diagnosis not present

## 2014-08-01 DIAGNOSIS — Z7901 Long term (current) use of anticoagulants: Secondary | ICD-10-CM

## 2014-08-01 LAB — POCT INR: INR: 2

## 2014-08-12 ENCOUNTER — Other Ambulatory Visit: Payer: Self-pay

## 2014-08-12 NOTE — Telephone Encounter (Signed)
Rx(s) sent to pharmacy electronically.  

## 2014-08-14 ENCOUNTER — Telehealth: Payer: Self-pay | Admitting: Pharmacist Clinician (PhC)/ Clinical Pharmacy Specialist

## 2014-08-14 ENCOUNTER — Other Ambulatory Visit: Payer: Self-pay | Admitting: Pharmacist Clinician (PhC)/ Clinical Pharmacy Specialist

## 2014-08-14 MED ORDER — WARFARIN SODIUM 5 MG PO TABS: ORAL_TABLET | ORAL | Status: DC

## 2014-08-14 NOTE — Telephone Encounter (Signed)
Pt called in needing his Warfarin refilled and sent to the [facility] on [place].   Thanks

## 2014-08-29 ENCOUNTER — Ambulatory Visit: Payer: Medicare Other | Admitting: Podiatry

## 2014-08-29 ENCOUNTER — Encounter: Payer: Self-pay | Admitting: Podiatry

## 2014-08-29 ENCOUNTER — Ambulatory Visit (INDEPENDENT_AMBULATORY_CARE_PROVIDER_SITE_OTHER): Payer: Medicare Other | Admitting: Podiatry

## 2014-08-29 VITALS — BP 133/68 | HR 53 | Resp 18

## 2014-08-29 DIAGNOSIS — M79676 Pain in unspecified toe(s): Secondary | ICD-10-CM

## 2014-08-29 DIAGNOSIS — B351 Tinea unguium: Secondary | ICD-10-CM | POA: Diagnosis not present

## 2014-09-01 ENCOUNTER — Encounter: Payer: Self-pay | Admitting: Podiatry

## 2014-09-01 NOTE — Progress Notes (Signed)
Patient ID: Gregory Werner, male   DOB: 03-27-1925, 81 y.o.   MRN: [id number]  Subjective: 81 y.o.-year-old male returns the office today for painful, elongated, thickened toenails which he is unable to trim himself. Denies any redness or drainage around the nails. Denies any acute changes since last appointment and no new complaints today. Denies any systemic complaints such as fevers, chills, nausea, vomiting.   Objective: AAO 3, NAD DP/PT pulses palpable, CRT less than 3 seconds Protective sensation intact with Simms Weinstein monofilament, Achilles tendon reflex intact.  Nails hypertrophic, dystrophic, elongated, brittle, discolored 10. There is tenderness overlying the nails 1-5 bilaterally. There is no surrounding erythema or drainage along the nail sites. No open lesions or pre-ulcerative lesions are identified. No other areas of tenderness bilateral lower extremities. No overlying edema, erythema, increased warmth. No pain with calf compression, swelling, warmth, erythema.  Assessment: Patient presents with symptomatic onychomycosis  Plan: -Treatment options including alternatives, risks, complications were discussed -Nails sharply debrided 10 without complication/bleeding. -Discussed daily foot inspection. If there are any changes, to call the office immediately.  -Follow-up in 3 months or sooner if any problems are to arise. In the meantime, encouraged to call the office with any questions, concerns, changes symptoms.

## 2014-09-12 ENCOUNTER — Ambulatory Visit (INDEPENDENT_AMBULATORY_CARE_PROVIDER_SITE_OTHER): Payer: Medicare Other | Admitting: Pharmacist Clinician (PhC)/ Clinical Pharmacy Specialist

## 2014-09-12 DIAGNOSIS — I4891 Unspecified atrial fibrillation: Secondary | ICD-10-CM

## 2014-09-12 DIAGNOSIS — Z7901 Long term (current) use of anticoagulants: Secondary | ICD-10-CM

## 2014-09-12 LAB — POCT INR: INR: 2

## 2014-09-24 ENCOUNTER — Encounter: Payer: Self-pay | Admitting: Internal Medicine

## 2014-10-17 ENCOUNTER — Telehealth: Payer: Self-pay | Admitting: *Deleted

## 2014-10-17 NOTE — Telephone Encounter (Signed)
Pt states his big toe is pink and to touch, denies diabetes, injury, wound or drainage.  I informed pt, we would schedule for next week and if he had signs of infection to go to his primary doctor or ED.  Pt agreed and I transferred to schedulers.

## 2014-10-21 ENCOUNTER — Encounter: Payer: Self-pay | Admitting: Podiatry

## 2014-10-21 ENCOUNTER — Ambulatory Visit (INDEPENDENT_AMBULATORY_CARE_PROVIDER_SITE_OTHER): Payer: Medicare Other

## 2014-10-21 ENCOUNTER — Ambulatory Visit (INDEPENDENT_AMBULATORY_CARE_PROVIDER_SITE_OTHER): Payer: Medicare Other | Admitting: Podiatry

## 2014-10-21 VITALS — BP 137/77 | HR 52 | Resp 14

## 2014-10-21 DIAGNOSIS — M79675 Pain in left toe(s): Secondary | ICD-10-CM

## 2014-10-21 DIAGNOSIS — M129 Arthropathy, unspecified: Secondary | ICD-10-CM

## 2014-10-21 DIAGNOSIS — B351 Tinea unguium: Secondary | ICD-10-CM

## 2014-10-21 DIAGNOSIS — M19072 Primary osteoarthritis, left ankle and foot: Secondary | ICD-10-CM

## 2014-10-21 NOTE — Progress Notes (Signed)
   Subjective:    Patient ID: Gregory Werner, male    DOB: 1925/02/22, 81 y.o.   MRN: [id number]  HPI  Patient presents here today with left great toe pain and swollen at the top. The pain started about 1 week ago, pain has subsided but still swollen.He has treated it polysporin which  Helped. This patient presents to my office with history of painful big toe left foot.  He says last week there was significant pain and redness around his big toenail left foot.  Today the pain redness and swelling has resolved.  He does have significant swelling over the top of his left foot.  He denies trauma or injury.  He presents for evaluation and treatment. Review of Systems  All other systems reviewed and are negative.      Objective:   Physical Exam GENERAL APPEARANCE: Alert, conversant. Appropriately groomed. No acute distress.  VASCULAR: Pedal pulses palpable at 2/4 DP and PT bilateral.  Capillary refill time is immediate to all digits,  Proximal to distal cooling it warm to warm.  Digital hair growth is present bilateral  NEUROLOGIC: sensation is intact epicritically and protectively to 5.07 monofilament at 5/5 sites bilateral.  Light touch is intact bilateral, vibratory sensation intact bilateral, achilles tendon reflex is intact bilateral.  MUSCULOSKELETAL: acceptable muscle strength, tone and stability bilateral.  Intrinsic muscluature intact bilateral.  Rectus appearance of foot and digits noted bilateral. There is significant swelling noted over his left foot with increased temperature.  Bony prominence noted over midfoot left foot.  DERMATOLOGIC: skin color, texture, and turgor are within normal limits.  No preulcerative lesions are seen, no interdigital maceration noted.  No open lesions present.  . No drainage noted.  No redness or streaking noted up his left foot. NAILS  Thick disfigured discolored nails both feet.       Assessment & Plan:    Arthritis left foot.   Onychomycosis  ROV   X-ray left foot.  Discussed condition with patient.  To call if foot becomes painful and swelling persists.

## 2014-10-24 ENCOUNTER — Ambulatory Visit (INDEPENDENT_AMBULATORY_CARE_PROVIDER_SITE_OTHER): Payer: Medicare Other | Admitting: Pharmacist Clinician (PhC)/ Clinical Pharmacy Specialist

## 2014-10-24 DIAGNOSIS — Z7901 Long term (current) use of anticoagulants: Secondary | ICD-10-CM | POA: Diagnosis not present

## 2014-10-24 DIAGNOSIS — I4891 Unspecified atrial fibrillation: Secondary | ICD-10-CM | POA: Diagnosis not present

## 2014-10-24 LAB — POCT INR: INR: 1.9

## 2014-10-29 ENCOUNTER — Other Ambulatory Visit: Payer: Self-pay | Admitting: *Deleted

## 2014-10-29 ENCOUNTER — Other Ambulatory Visit: Payer: Self-pay | Admitting: Cardiology

## 2014-10-29 NOTE — Telephone Encounter (Signed)
REFILL 

## 2014-12-02 ENCOUNTER — Ambulatory Visit (INDEPENDENT_AMBULATORY_CARE_PROVIDER_SITE_OTHER): Payer: Medicare Other | Admitting: Podiatry

## 2014-12-02 DIAGNOSIS — M79676 Pain in unspecified toe(s): Secondary | ICD-10-CM | POA: Diagnosis not present

## 2014-12-02 DIAGNOSIS — M19072 Primary osteoarthritis, left ankle and foot: Secondary | ICD-10-CM

## 2014-12-02 DIAGNOSIS — B351 Tinea unguium: Secondary | ICD-10-CM

## 2014-12-02 DIAGNOSIS — M129 Arthropathy, unspecified: Secondary | ICD-10-CM

## 2014-12-02 NOTE — Progress Notes (Signed)
Patient ID: Gregory Werner, male   DOB: 11-Sep-1924, 81 y.o.   MRN: [id number] Complaint:  Visit Type: Patient returns to my office for continued preventative foot care services. Complaint: Patient states" my nails have grown long and thick and become painful to walk and wear shoes" . The patient presents for preventative foot care services. No changes to ROS  Podiatric Exam: Vascular: dorsalis pedis and posterior tibial pulses are palpable bilateral. Capillary return is immediate. Temperature gradient is WNL. Skin turgor WNL  Sensorium: Normal Semmes Weinstein monofilament test. Normal tactile sensation bilaterally. Nail Exam: Pt has thick disfigured discolored nails with subungual debris noted bilateral entire nail hallux through fifth toenails Ulcer Exam: There is no evidence of ulcer or pre-ulcerative changes or infection. Orthopedic Exam: Muscle tone and strength are WNL. No limitations in general ROM. No crepitus or effusions noted. Foot type and digits show no abnormalities. Bony prominences over left midfoot. Skin: No Porokeratosis. No infection or ulcers  Diagnosis:  Onychomycosis, , Pain in right toe, pain in left toes  Treatment & Plan Procedures and Treatment: Consent by patient was obtained for treatment procedures. The patient understood the discussion of treatment and procedures well. All questions were answered thoroughly reviewed. Debridement of mycotic and hypertrophic toenails, 1 through 5 bilateral and clearing of subungual debris. No ulceration, no infection noted.  Return Visit-Office Procedure: Patient instructed to return to the office for a follow up visit 3 months for continued evaluation and treatment.

## 2014-12-05 ENCOUNTER — Ambulatory Visit (INDEPENDENT_AMBULATORY_CARE_PROVIDER_SITE_OTHER): Payer: Medicare Other | Admitting: Pharmacist Clinician (PhC)/ Clinical Pharmacy Specialist

## 2014-12-05 DIAGNOSIS — I4891 Unspecified atrial fibrillation: Secondary | ICD-10-CM

## 2014-12-05 DIAGNOSIS — Z7901 Long term (current) use of anticoagulants: Secondary | ICD-10-CM

## 2014-12-05 LAB — POCT INR: INR: 2.5

## 2015-01-09 ENCOUNTER — Ambulatory Visit (INDEPENDENT_AMBULATORY_CARE_PROVIDER_SITE_OTHER): Payer: Medicare Other | Admitting: Pharmacist Clinician (PhC)/ Clinical Pharmacy Specialist

## 2015-01-09 DIAGNOSIS — Z7901 Long term (current) use of anticoagulants: Secondary | ICD-10-CM

## 2015-01-09 DIAGNOSIS — I4891 Unspecified atrial fibrillation: Secondary | ICD-10-CM | POA: Diagnosis not present

## 2015-01-09 LAB — POCT INR: INR: 2.5

## 2015-02-20 ENCOUNTER — Ambulatory Visit (INDEPENDENT_AMBULATORY_CARE_PROVIDER_SITE_OTHER): Payer: Medicare Other | Admitting: Pharmacist Clinician (PhC)/ Clinical Pharmacy Specialist

## 2015-02-20 DIAGNOSIS — Z7901 Long term (current) use of anticoagulants: Secondary | ICD-10-CM | POA: Diagnosis not present

## 2015-02-20 DIAGNOSIS — I4891 Unspecified atrial fibrillation: Secondary | ICD-10-CM | POA: Diagnosis not present

## 2015-02-20 LAB — POCT INR: INR: 2.1

## 2015-02-23 ENCOUNTER — Telehealth: Payer: Self-pay | Admitting: Cardiology

## 2015-02-23 NOTE — Telephone Encounter (Signed)
New message     FYI Calling to let nurse know he is not using mychart.  Please flag chart for everyone to call him and not send msg thru mychart.  If possible, turn mychart off.

## 2015-02-23 NOTE — Telephone Encounter (Signed)
Called and verified - pt discontinued from [facility] services at his own request.

## 2015-02-24 ENCOUNTER — Other Ambulatory Visit (HOSPITAL_COMMUNITY): Payer: Medicare Other

## 2015-02-24 ENCOUNTER — Other Ambulatory Visit (HOSPITAL_COMMUNITY): Payer: Self-pay | Admitting: Internal Medicine

## 2015-02-24 DIAGNOSIS — R011 Cardiac murmur, unspecified: Secondary | ICD-10-CM

## 2015-03-03 ENCOUNTER — Encounter: Payer: Self-pay | Admitting: Podiatry

## 2015-03-03 ENCOUNTER — Ambulatory Visit (INDEPENDENT_AMBULATORY_CARE_PROVIDER_SITE_OTHER): Payer: Medicare Other | Admitting: Podiatry

## 2015-03-03 DIAGNOSIS — B351 Tinea unguium: Secondary | ICD-10-CM

## 2015-03-03 DIAGNOSIS — M129 Arthropathy, unspecified: Secondary | ICD-10-CM

## 2015-03-03 DIAGNOSIS — M79675 Pain in left toe(s): Secondary | ICD-10-CM

## 2015-03-03 DIAGNOSIS — M19072 Primary osteoarthritis, left ankle and foot: Secondary | ICD-10-CM

## 2015-03-03 MED ORDER — MELOXICAM 15 MG PO TABS: 15.0000 mg | ORAL_TABLET | Freq: Every day | ORAL | Status: DC

## 2015-03-03 NOTE — Progress Notes (Signed)
Patient ID: Gregory Werner, male   DOB: September 15, 1924, 81 y.o.   MRN: [id number] Complaint:  Visit Type: Patient returns to my office for continued preventative foot care services. Complaint: Patient states" my nails have grown long and thick and become painful to walk and wear shoes" . The patient presents for preventative foot care services. No changes to ROS  Podiatric Exam: Vascular: dorsalis pedis and posterior tibial pulses are palpable bilateral. Capillary return is immediate. Temperature gradient is WNL. Skin turgor WNL  Sensorium: Normal Semmes Weinstein monofilament test. Normal tactile sensation bilaterally. Nail Exam: Pt has thick disfigured discolored nails with subungual debris noted bilateral entire nail hallux through fifth toenails Ulcer Exam: There is no evidence of ulcer or pre-ulcerative changes or infection. Orthopedic Exam: Muscle tone and strength are WNL. No limitations in general ROM. No crepitus or effusions noted. Foot type and digits show no abnormalities. Bony prominences over left midfoot. 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 Skin: No Porokeratosis. No infection or ulcers  Diagnosis:  Onychomycosis, , Pain in right toe, pain in left toes  Treatment & Plan Procedures and Treatment: Consent by patient was obtained for treatment procedures. The patient understood the discussion of treatment and procedures well. All questions were answered thoroughly reviewed. Debridement of mycotic and hypertrophic toenails, 1 through 5 bilateral and clearing of subungual debris. No ulceration, no infection noted. Prescribed Mobic.  prn Return Visit-Office Procedure: Patient instructed to return to the office for a follow up visit 3 months for continued evaluation and treatment.  Gardiner Barefoot DPM

## 2015-03-13 ENCOUNTER — Other Ambulatory Visit: Payer: Self-pay

## 2015-03-13 ENCOUNTER — Ambulatory Visit (HOSPITAL_COMMUNITY): Payer: Medicare Other | Attending: Cardiology

## 2015-03-13 DIAGNOSIS — E785 Hyperlipidemia, unspecified: Secondary | ICD-10-CM | POA: Insufficient documentation

## 2015-03-13 DIAGNOSIS — I10 Essential (primary) hypertension: Secondary | ICD-10-CM | POA: Diagnosis not present

## 2015-03-13 DIAGNOSIS — R011 Cardiac murmur, unspecified: Secondary | ICD-10-CM | POA: Insufficient documentation

## 2015-03-30 ENCOUNTER — Other Ambulatory Visit: Payer: Self-pay | Admitting: Cardiology

## 2015-04-03 ENCOUNTER — Ambulatory Visit (INDEPENDENT_AMBULATORY_CARE_PROVIDER_SITE_OTHER): Payer: Medicare Other | Admitting: Pharmacist Clinician (PhC)/ Clinical Pharmacy Specialist

## 2015-04-03 DIAGNOSIS — Z7901 Long term (current) use of anticoagulants: Secondary | ICD-10-CM | POA: Diagnosis not present

## 2015-04-03 DIAGNOSIS — I4891 Unspecified atrial fibrillation: Secondary | ICD-10-CM | POA: Diagnosis not present

## 2015-04-03 LAB — POCT INR: INR: 2.3

## 2015-04-26 ENCOUNTER — Other Ambulatory Visit: Payer: Self-pay | Admitting: Cardiology

## 2015-04-27 NOTE — Telephone Encounter (Signed)
REFILL 

## 2015-05-15 ENCOUNTER — Ambulatory Visit (INDEPENDENT_AMBULATORY_CARE_PROVIDER_SITE_OTHER): Payer: Medicare Other | Admitting: Pharmacist Clinician (PhC)/ Clinical Pharmacy Specialist

## 2015-05-15 DIAGNOSIS — Z7901 Long term (current) use of anticoagulants: Secondary | ICD-10-CM

## 2015-05-15 DIAGNOSIS — I4891 Unspecified atrial fibrillation: Secondary | ICD-10-CM

## 2015-05-15 LAB — POCT INR: INR: 2.7

## 2015-05-29 ENCOUNTER — Other Ambulatory Visit: Payer: Self-pay | Admitting: Cardiology

## 2015-05-29 DIAGNOSIS — I6523 Occlusion and stenosis of bilateral carotid arteries: Secondary | ICD-10-CM

## 2015-06-02 ENCOUNTER — Ambulatory Visit: Payer: Medicare Other | Admitting: Podiatry

## 2015-06-26 ENCOUNTER — Ambulatory Visit (INDEPENDENT_AMBULATORY_CARE_PROVIDER_SITE_OTHER): Payer: Medicare Other | Admitting: Pharmacist Clinician (PhC)/ Clinical Pharmacy Specialist

## 2015-06-26 ENCOUNTER — Ambulatory Visit (HOSPITAL_COMMUNITY)
Admission: RE | Admit: 2015-06-26 | Discharge: 2015-06-26 | Disposition: A | Discharge status: Home / Self Care | Payer: Medicare Other | Source: Ambulatory Visit | Attending: Cardiology | Admitting: Cardiology

## 2015-06-26 DIAGNOSIS — I10 Essential (primary) hypertension: Secondary | ICD-10-CM | POA: Diagnosis not present

## 2015-06-26 DIAGNOSIS — E785 Hyperlipidemia, unspecified: Secondary | ICD-10-CM | POA: Insufficient documentation

## 2015-06-26 DIAGNOSIS — Z7901 Long term (current) use of anticoagulants: Secondary | ICD-10-CM

## 2015-06-26 DIAGNOSIS — K219 Gastro-esophageal reflux disease without esophagitis: Secondary | ICD-10-CM | POA: Diagnosis not present

## 2015-06-26 DIAGNOSIS — I6523 Occlusion and stenosis of bilateral carotid arteries: Secondary | ICD-10-CM | POA: Diagnosis present

## 2015-06-26 DIAGNOSIS — I4891 Unspecified atrial fibrillation: Secondary | ICD-10-CM

## 2015-06-26 LAB — POCT INR: INR: 2.2

## 2015-06-30 ENCOUNTER — Encounter: Payer: Self-pay | Admitting: Podiatry

## 2015-06-30 ENCOUNTER — Ambulatory Visit (INDEPENDENT_AMBULATORY_CARE_PROVIDER_SITE_OTHER): Payer: Medicare Other | Admitting: Podiatry

## 2015-06-30 DIAGNOSIS — M79675 Pain in left toe(s): Secondary | ICD-10-CM

## 2015-06-30 DIAGNOSIS — B351 Tinea unguium: Secondary | ICD-10-CM

## 2015-06-30 NOTE — Progress Notes (Signed)
Patient ID: Gregory Werner, male   DOB: [birth year], 82 y.o.   MRN: [id number] Complaint:  Visit Type: Patient returns to my office for continued preventative foot care services. Complaint: Patient states" my nails have grown long and thick and become painful to walk and wear shoes" . The patient presents for preventative foot care services. No changes to ROS  Podiatric Exam: Vascular: dorsalis pedis and posterior tibial pulses are palpable bilateral. Capillary return is immediate. Temperature gradient is WNL. Skin turgor WNL  Sensorium: Normal Semmes Weinstein monofilament test. Normal tactile sensation bilaterally. Nail Exam: Pt has thick disfigured discolored nails with subungual debris noted bilateral entire nail hallux through fifth toenails Ulcer Exam: There is no evidence of ulcer or pre-ulcerative changes or infection. Orthopedic Exam: Muscle tone and strength are WNL. No limitations in general ROM. No crepitus or effusions noted. Foot type and digits show no abnormalities. Bony prominences over left midfoot. 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 Skin: No Porokeratosis. No infection or ulcers  Diagnosis:  Onychomycosis, , Pain in right toe, pain in left toes  Treatment & Plan Procedures and Treatment: Consent by patient was obtained for treatment procedures. The patient understood the discussion of treatment and procedures well. All questions were answered thoroughly reviewed. Debridement of mycotic and hypertrophic toenails, 1 through 5 bilateral and clearing of subungual debris. No ulceration, no infection noted. Prescribed Mobic.  prn Return Visit-Office Procedure: Patient instructed to return to the office for a follow up visit 3 months for continued evaluation and treatment.  Gardiner Barefoot DPM

## 2015-07-22 ENCOUNTER — Other Ambulatory Visit: Payer: Self-pay | Admitting: Cardiology

## 2015-08-07 ENCOUNTER — Ambulatory Visit (INDEPENDENT_AMBULATORY_CARE_PROVIDER_SITE_OTHER): Payer: Medicare Other | Admitting: Pharmacist Clinician (PhC)/ Clinical Pharmacy Specialist

## 2015-08-07 DIAGNOSIS — Z7901 Long term (current) use of anticoagulants: Secondary | ICD-10-CM

## 2015-08-07 DIAGNOSIS — I4891 Unspecified atrial fibrillation: Secondary | ICD-10-CM | POA: Diagnosis not present

## 2015-08-07 LAB — POCT INR: INR: 2.2

## 2015-09-02 ENCOUNTER — Other Ambulatory Visit: Payer: Self-pay | Admitting: Cardiology

## 2015-09-02 NOTE — Telephone Encounter (Signed)
Rx(s) sent to pharmacy electronically.  

## 2015-09-18 ENCOUNTER — Ambulatory Visit (INDEPENDENT_AMBULATORY_CARE_PROVIDER_SITE_OTHER): Payer: Medicare Other | Admitting: Pharmacist

## 2015-09-18 DIAGNOSIS — I4891 Unspecified atrial fibrillation: Secondary | ICD-10-CM

## 2015-09-18 DIAGNOSIS — Z7901 Long term (current) use of anticoagulants: Secondary | ICD-10-CM | POA: Diagnosis not present

## 2015-09-18 LAB — POCT INR: INR: 2.5

## 2015-10-02 ENCOUNTER — Other Ambulatory Visit: Payer: Self-pay | Admitting: Cardiology

## 2015-10-02 NOTE — Telephone Encounter (Signed)
Please contact the office and schedule a follow up appointment [phone] (2nd attempt)

## 2015-10-02 NOTE — Telephone Encounter (Signed)
Please contact the office and schedule a follow up appointment [phone] (2nd attempt)

## 2015-10-27 ENCOUNTER — Ambulatory Visit (INDEPENDENT_AMBULATORY_CARE_PROVIDER_SITE_OTHER): Payer: Medicare Other | Admitting: Podiatry

## 2015-10-27 ENCOUNTER — Encounter: Payer: Self-pay | Admitting: Podiatry

## 2015-10-27 DIAGNOSIS — B351 Tinea unguium: Secondary | ICD-10-CM | POA: Diagnosis not present

## 2015-10-27 DIAGNOSIS — M79676 Pain in unspecified toe(s): Secondary | ICD-10-CM

## 2015-10-27 NOTE — Progress Notes (Signed)
Patient ID: Gregory Werner, male   DOB: 15-Apr-1924, 82 y.o.   MRN: [id number] Complaint:  Visit Type: Patient returns to my office for continued preventative foot care services. Complaint: Patient states" my nails have grown long and thick and become painful to walk and wear shoes" . The patient presents for preventative foot care services. No changes to ROS  Podiatric Exam: Vascular: dorsalis pedis and posterior tibial pulses are palpable bilateral. Capillary return is immediate. Temperature gradient is WNL. Skin turgor WNL  Sensorium: Normal Semmes Weinstein monofilament test. Normal tactile sensation bilaterally. Nail Exam: Pt has thick disfigured discolored nails with subungual debris noted bilateral entire nail hallux through fifth toenails Ulcer Exam: There is no evidence of ulcer or pre-ulcerative changes or infection. Orthopedic Exam: Muscle tone and strength are WNL. No limitations in general ROM. No crepitus or effusions noted. Foot type and digits show no abnormalities. Bony prominences over left midfoot. 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 Skin: No Porokeratosis. No infection or ulcers  Diagnosis:  Onychomycosis, , Pain in right toe, pain in left toes  Treatment & Plan Procedures and Treatment: Consent by patient was obtained for treatment procedures. The patient understood the discussion of treatment and procedures well. All questions were answered thoroughly reviewed. Debridement of mycotic and hypertrophic toenails, 1 through 5 bilateral and clearing of subungual debris. No ulceration, no infection noted. Prescribed Mobic.  prn Return Visit-Office Procedure: Patient instructed to return to the office for a follow up visit 4 months for continued evaluation and treatment.  Gardiner Barefoot DPM

## 2015-10-30 ENCOUNTER — Ambulatory Visit (INDEPENDENT_AMBULATORY_CARE_PROVIDER_SITE_OTHER): Payer: Medicare Other | Admitting: Pharmacist Clinician (PhC)/ Clinical Pharmacy Specialist

## 2015-10-30 DIAGNOSIS — I4891 Unspecified atrial fibrillation: Secondary | ICD-10-CM

## 2015-10-30 DIAGNOSIS — Z7901 Long term (current) use of anticoagulants: Secondary | ICD-10-CM | POA: Diagnosis not present

## 2015-10-30 LAB — POCT INR: INR: 2.6

## 2015-11-05 NOTE — Progress Notes (Signed)
HPI The patient presents for follow up of atrial fib.  Since I last saw him he has done well.  The patient denies any new symptoms such as chest discomfort, neck or arm discomfort. There has been no new shortness of breath, PND or orthopnea. There have been no reported palpitations, presyncope or syncope.  He does some exercise daily. Goes to the [facility]. He walks around his house. He doesn't have significant cardiovascular symptoms with this.  No Known Allergies  Current Outpatient Prescriptions  Medication Sig Dispense Refill  . acetaminophen (TYLENOL) 500 MG tablet Take 500 mg by mouth every 6 (six) hours as needed (Pain).    . calcium carbonate 200 MG capsule Take 800 mg by mouth daily.    . cholecalciferol (VITAMIN D) 1000 UNITS tablet Take 1,000 Units by mouth daily.    . Cyanocobalamin (VITAMIN B 12 PO) Take 1,000 mg by mouth daily.    . famotidine (PEPCID AC) 10 MG chewable tablet Chew 10 mg by mouth at bedtime.    . famotidine (PEPCID) 20 MG tablet Take 20 mg by mouth daily.     Marland Kitchen ibandronate (BONIVA) 150 MG tablet Take 150 mg by mouth every 30 (thirty) days. Take in the morning with a full glass of water, on an empty stomach, and do not take anything else by mouth or lie down for the next 30 min.    . Multiple Vitamins-Minerals (VISION-VITE PRESERVE PO) Take 2 capsules by mouth daily.    Marland Kitchen MYRBETRIQ 50 MG TB24 Take 1 tablet by mouth daily. Takes at PM    . Omega-3 Fatty Acids (FISH OIL) 1200 MG CAPS Take 1 capsule by mouth daily.    . ramipril (ALTACE) 5 MG capsule TAKE 2 CAPSULES BY MOUTH DAILY. NEEDS APPOINTMENT FOR REFILLS. 30 capsule 0  . terazosin (HYTRIN) 2 MG capsule Take 2 mg by mouth at bedtime.      . vitamin C (ASCORBIC ACID) 500 MG tablet Take 500 mg by mouth daily.    Marland Kitchen warfarin (COUMADIN) 5 MG tablet TAKE 1 TABLET BY MOUTH DAILY AS DIRECTED BY COUMADIN CLINIC (Patient taking differently: Take 2.5 mg on Monday, Wednesday, and Friday. Take 5 mg Tuesday, Tursday, Saturday,  and Sunday.) 90 tablet 1   No current facility-administered medications for this visit.     Past Medical History:  Diagnosis Date  . Arthritis   . Atrial fibrillation ([place])   . Diverticulosis    history  . Dysphagia 2001   history s/p esophageal dilatation  . Dysrhythmia    hx. Atrial Fibrillation, RBBB  . GERD (gastroesophageal reflux disease)   . Hearing loss    bilateral hearing aids  . Hyperlipidemia   . Hypertension    benign  . Lung infiltrate 05/2010   left lower lobe infiltrate, questionable pneumonia  . Prostate cancer [facility]) 2001   treated with radiation    Past Surgical History:  Procedure Laterality Date  . CATARACT EXTRACTION  2008  . ESOPHAGEAL DILATION  2001  . EYE SURGERY    . JOINT REPLACEMENT     LTKA  . PROSTATE SURGERY    . REPLACEMENT TOTAL KNEE  2012   left  . TONSILLECTOMY    . TOTAL KNEE ARTHROPLASTY Right 08/19/2013   Procedure: RIGHT TOTAL KNEE ARTHROPLASTY;  Surgeon: Gearlean Alf, MD;  Location: [facility];  Service: Orthopedics;  Laterality: Right;  Marland Kitchen VASECTOMY      ROS:  As stated in the HPI and  negative for all other systems.  PHYSICAL EXAM BP 120/86 (BP Location: Left Arm, Patient Position: Sitting, Cuff Size: Normal)   Pulse (!) 53   Ht 5\' 10"  (1.778 m)   Wt 159 lb 6.4 oz (72.3 kg)   BMI 22.87 kg/m  GENERAL:  Well appearing but slight.   NECK:  No jugular venous distention, waveform within normal limits, carotid upstroke brisk and symmetric, slight right bruit, no thyromegaly LUNGS:  Clear to auscultation bilaterally, lordosis BACK:  No CVA tenderness HEART:  PMI not displaced or sustained,S1 and S2 within normal limits, no S3,  no clicks, no rubs, slight early peaking systolic, no diastolic murmurs, irregular ABD:  Flat, positive bowel sounds normal in frequency in pitch, no bruits, no rebound, no guarding, no midline pulsatile mass, no hepatomegaly, no splenomegaly EXT:  2 plus pulses throughout, mild edema, no cyanosis no  clubbing, chronic venous stasis changes. The  ASSESSMENT AND PLAN  ATRIAL FIB:  The patient  tolerates this rhythm and rate control and anticoagulation. We will continue with the meds as listed.  The patient is not interested in switching to a novel oral anticoagulant.   Mr. Gregory Werner has a CHA2DS2 - VASc score of 3 with a risk of stroke of 3.2%.  CAROTID BRUIT:  This was very mild when he had it checked in March. No follow-up is necessary.  HTN:  His blood pressure is well controlled and he will continue meds as listed.

## 2015-11-06 ENCOUNTER — Encounter: Payer: Self-pay | Admitting: Cardiology

## 2015-11-06 ENCOUNTER — Ambulatory Visit (INDEPENDENT_AMBULATORY_CARE_PROVIDER_SITE_OTHER): Payer: Medicare Other | Admitting: Cardiology

## 2015-11-06 VITALS — BP 120/86 | HR 53 | Ht 70.0 in | Wt 159.4 lb

## 2015-11-06 DIAGNOSIS — I482 Chronic atrial fibrillation, unspecified: Secondary | ICD-10-CM

## 2015-11-06 NOTE — Patient Instructions (Signed)
Your physician wants you to follow-up in: 1 Year. You will receive a reminder letter in the mail two months in advance. If you don't receive a letter, please call our office to schedule the follow-up appointment.  

## 2015-11-10 ENCOUNTER — Other Ambulatory Visit: Payer: Self-pay | Admitting: Cardiology

## 2015-11-10 ENCOUNTER — Other Ambulatory Visit: Payer: Self-pay | Admitting: *Deleted

## 2015-11-10 MED ORDER — RAMIPRIL 5 MG PO CAPS: 5.0000 mg | ORAL_CAPSULE | Freq: Two times a day (BID) | ORAL | 11 refills | Status: DC

## 2015-12-11 ENCOUNTER — Ambulatory Visit (INDEPENDENT_AMBULATORY_CARE_PROVIDER_SITE_OTHER): Payer: Medicare Other | Admitting: Pharmacist

## 2015-12-11 DIAGNOSIS — I4891 Unspecified atrial fibrillation: Secondary | ICD-10-CM

## 2015-12-11 DIAGNOSIS — Z7901 Long term (current) use of anticoagulants: Secondary | ICD-10-CM | POA: Diagnosis not present

## 2015-12-11 LAB — POCT INR: INR: 2.3

## 2016-01-21 ENCOUNTER — Telehealth: Payer: Self-pay | Admitting: Interventional Cardiology

## 2016-01-21 NOTE — Telephone Encounter (Signed)
Encounter not needed

## 2016-01-22 ENCOUNTER — Ambulatory Visit (INDEPENDENT_AMBULATORY_CARE_PROVIDER_SITE_OTHER): Payer: Medicare Other | Admitting: Pharmacist Clinician (PhC)/ Clinical Pharmacy Specialist

## 2016-01-22 DIAGNOSIS — Z7901 Long term (current) use of anticoagulants: Secondary | ICD-10-CM

## 2016-01-22 DIAGNOSIS — I4891 Unspecified atrial fibrillation: Secondary | ICD-10-CM

## 2016-01-22 LAB — POCT INR: INR: 2.5

## 2016-02-16 ENCOUNTER — Ambulatory Visit: Payer: Medicare Other | Admitting: Podiatry

## 2016-03-04 ENCOUNTER — Ambulatory Visit (INDEPENDENT_AMBULATORY_CARE_PROVIDER_SITE_OTHER): Payer: Medicare Other | Admitting: Pharmacist

## 2016-03-04 DIAGNOSIS — I4891 Unspecified atrial fibrillation: Secondary | ICD-10-CM

## 2016-03-04 DIAGNOSIS — Z7901 Long term (current) use of anticoagulants: Secondary | ICD-10-CM | POA: Diagnosis not present

## 2016-03-04 LAB — POCT INR: INR: 2.7

## 2016-03-15 ENCOUNTER — Encounter: Payer: Self-pay | Admitting: Podiatry

## 2016-03-15 ENCOUNTER — Ambulatory Visit (INDEPENDENT_AMBULATORY_CARE_PROVIDER_SITE_OTHER): Payer: Medicare Other | Admitting: Podiatry

## 2016-03-15 DIAGNOSIS — M19072 Primary osteoarthritis, left ankle and foot: Secondary | ICD-10-CM

## 2016-03-15 DIAGNOSIS — B351 Tinea unguium: Secondary | ICD-10-CM | POA: Diagnosis not present

## 2016-03-15 DIAGNOSIS — M79676 Pain in unspecified toe(s): Secondary | ICD-10-CM

## 2016-03-15 NOTE — Progress Notes (Signed)
Patient ID: Gregory Werner, male   DOB: 10/14/1924, 82 y.o.   MRN: [id number] Complaint:  Visit Type: Patient returns to my office for continued preventative foot care services. Complaint: Patient states" my nails have grown long and thick and become painful to walk and wear shoes" . The patient presents for preventative foot care services. No changes to ROS  Podiatric Exam: Vascular: dorsalis pedis and posterior tibial pulses are palpable bilateral. Capillary return is immediate. Temperature gradient is WNL. Skin turgor WNL  Sensorium: Normal Semmes Weinstein monofilament test. Normal tactile sensation bilaterally. Nail Exam: Pt has thick disfigured discolored nails with subungual debris noted bilateral entire nail hallux through fifth toenails Ulcer Exam: There is no evidence of ulcer or pre-ulcerative changes or infection. Orthopedic Exam: Muscle tone and strength are WNL. No limitations in general ROM. No crepitus or effusions noted. Foot type and digits show no abnormalities. Bony prominences over left midfoot. 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 Skin: No Porokeratosis. No infection or ulcers  Diagnosis:  Onychomycosis, , Pain in right toe, pain in left toes  Treatment & Plan Procedures and Treatment: Consent by patient was obtained for treatment procedures. The patient understood the discussion of treatment and procedures well. All questions were answered thoroughly reviewed. Debridement of mycotic and hypertrophic toenails, 1 through 5 bilateral and clearing of subungual debris. No ulceration, no infection noted. Prescribed Mobic.  prn Return Visit-Office Procedure: Patient instructed to return to the office for a follow up visit 4 months for continued evaluation and treatment.  Johany Hansman DPM 

## 2016-04-15 ENCOUNTER — Ambulatory Visit (INDEPENDENT_AMBULATORY_CARE_PROVIDER_SITE_OTHER): Payer: Medicare Other | Admitting: Pharmacist

## 2016-04-15 DIAGNOSIS — I4891 Unspecified atrial fibrillation: Secondary | ICD-10-CM | POA: Diagnosis not present

## 2016-04-15 DIAGNOSIS — Z7901 Long term (current) use of anticoagulants: Secondary | ICD-10-CM | POA: Diagnosis not present

## 2016-04-15 LAB — POCT INR: INR: 1.8

## 2016-05-27 ENCOUNTER — Ambulatory Visit (INDEPENDENT_AMBULATORY_CARE_PROVIDER_SITE_OTHER): Payer: Medicare Other | Admitting: Pharmacist

## 2016-05-27 DIAGNOSIS — I4891 Unspecified atrial fibrillation: Secondary | ICD-10-CM | POA: Diagnosis not present

## 2016-05-27 DIAGNOSIS — Z7901 Long term (current) use of anticoagulants: Secondary | ICD-10-CM

## 2016-05-27 LAB — POCT INR: INR: 2.9

## 2016-06-27 ENCOUNTER — Other Ambulatory Visit: Payer: Self-pay | Admitting: Cardiology

## 2016-07-08 ENCOUNTER — Ambulatory Visit (INDEPENDENT_AMBULATORY_CARE_PROVIDER_SITE_OTHER): Payer: Medicare Other | Admitting: Pharmacist

## 2016-07-08 DIAGNOSIS — Z7901 Long term (current) use of anticoagulants: Secondary | ICD-10-CM

## 2016-07-08 DIAGNOSIS — I4891 Unspecified atrial fibrillation: Secondary | ICD-10-CM

## 2016-07-08 LAB — POCT INR: INR: 2.7

## 2016-07-15 ENCOUNTER — Ambulatory Visit (INDEPENDENT_AMBULATORY_CARE_PROVIDER_SITE_OTHER): Payer: Medicare Other | Admitting: Podiatry

## 2016-07-15 DIAGNOSIS — M19072 Primary osteoarthritis, left ankle and foot: Secondary | ICD-10-CM

## 2016-07-15 DIAGNOSIS — B351 Tinea unguium: Secondary | ICD-10-CM | POA: Diagnosis not present

## 2016-07-15 DIAGNOSIS — M79676 Pain in unspecified toe(s): Secondary | ICD-10-CM

## 2016-07-15 NOTE — Progress Notes (Signed)
Patient ID: Gregory Werner, male   DOB: 06-04-1924, 83 y.o.   MRN: [id number] Complaint:  Visit Type: Patient returns to my office for continued preventative foot care services. Complaint: Patient states" my nails have grown long and thick and become painful to walk and wear shoes" . The patient presents for preventative foot care services. No changes to ROS  Podiatric Exam: Vascular: dorsalis pedis and posterior tibial pulses are palpable bilateral. Capillary return is immediate. Temperature gradient is WNL. Skin turgor WNL  Sensorium: Normal Semmes Weinstein monofilament test. Normal tactile sensation bilaterally. Nail Exam: Pt has thick disfigured discolored nails with subungual debris noted bilateral entire nail hallux through fifth toenails Ulcer Exam: There is no evidence of ulcer or pre-ulcerative changes or infection. Orthopedic Exam: Muscle tone and strength are WNL. No limitations in general ROM. No crepitus or effusions noted. Foot type and digits show no abnormalities. Bony prominences over left midfoot. 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 Skin: No Porokeratosis. No infection or ulcers  Diagnosis:  Onychomycosis, , Pain in right toe, pain in left toes  Treatment & Plan Procedures and Treatment: Consent by patient was obtained for treatment procedures. The patient understood the discussion of treatment and procedures well. All questions were answered thoroughly reviewed. Debridement of mycotic and hypertrophic toenails, 1 through 5 bilateral and clearing of subungual debris. No ulceration, no infection noted. Prescribed Mobic.  prn Return Visit-Office Procedure: Patient instructed to return to the office for a follow up visit 4 months for continued evaluation and treatment.  Gardiner Barefoot DPM

## 2016-08-19 ENCOUNTER — Ambulatory Visit (INDEPENDENT_AMBULATORY_CARE_PROVIDER_SITE_OTHER): Payer: Medicare Other | Admitting: Pharmacist

## 2016-08-19 DIAGNOSIS — I4891 Unspecified atrial fibrillation: Secondary | ICD-10-CM

## 2016-08-19 DIAGNOSIS — Z7901 Long term (current) use of anticoagulants: Secondary | ICD-10-CM | POA: Diagnosis not present

## 2016-08-19 LAB — POCT INR: INR: 2.5

## 2016-09-30 ENCOUNTER — Ambulatory Visit (INDEPENDENT_AMBULATORY_CARE_PROVIDER_SITE_OTHER): Payer: Medicare Other | Admitting: Pharmacist

## 2016-09-30 DIAGNOSIS — I4891 Unspecified atrial fibrillation: Secondary | ICD-10-CM

## 2016-09-30 DIAGNOSIS — Z7901 Long term (current) use of anticoagulants: Secondary | ICD-10-CM | POA: Diagnosis not present

## 2016-09-30 LAB — POCT INR: INR: 2.2

## 2016-10-21 ENCOUNTER — Encounter: Payer: Self-pay | Admitting: Podiatry

## 2016-10-21 ENCOUNTER — Ambulatory Visit (INDEPENDENT_AMBULATORY_CARE_PROVIDER_SITE_OTHER): Payer: Medicare Other | Admitting: Podiatry

## 2016-10-21 DIAGNOSIS — M79676 Pain in unspecified toe(s): Secondary | ICD-10-CM

## 2016-10-21 DIAGNOSIS — B351 Tinea unguium: Secondary | ICD-10-CM

## 2016-10-21 NOTE — Progress Notes (Signed)
Patient ID: Gregory Werner, male   DOB: May 20, 1924, 83 y.o.   MRN: [id number] Complaint:  Visit Type: Patient returns to my office for continued preventative foot care services. Complaint: Patient states" my nails have grown long and thick and become painful to walk and wear shoes" . The patient presents for preventative foot care services. No changes to ROS  Podiatric Exam: Vascular: dorsalis pedis and posterior tibial pulses are palpable bilateral. Capillary return is immediate. Temperature gradient is WNL. Skin turgor WNL  Sensorium: Normal Semmes Weinstein monofilament test. Normal tactile sensation bilaterally. Nail Exam: Pt has thick disfigured discolored nails with subungual debris noted bilateral entire nail hallux through fifth toenails Ulcer Exam: There is no evidence of ulcer or pre-ulcerative changes or infection. Orthopedic Exam: Muscle tone and strength are WNL. No limitations in general ROM. No crepitus or effusions noted. Foot type and digits show no abnormalities. Bony prominences over left midfoot. 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 Skin: No Porokeratosis. No infection or ulcers  Diagnosis:  Onychomycosis, , Pain in right toe, pain in left toes  Treatment & Plan Procedures and Treatment: Consent by patient was obtained for treatment procedures. The patient understood the discussion of treatment and procedures well. All questions were answered thoroughly reviewed. Debridement of mycotic and hypertrophic toenails, 1 through 5 bilateral and clearing of subungual debris. No ulceration, no infection noted. Prescribed Mobic.  prn Return Visit-Office Procedure: Patient instructed to return to the office for a follow up visit 4 months for continued evaluation and treatment.  Gardiner Barefoot DPM

## 2016-11-06 NOTE — Progress Notes (Signed)
HPI The patient presents for follow up of atrial fib.  Since I last saw him he has been doing well.  He still goes to do water aerobics and walks at home.  The patient denies any new symptoms such as chest discomfort, neck or arm discomfort. There has been no new shortness of breath, PND or orthopnea. There have been no reported palpitations, presyncope or syncope.  He does not notice his atrial fib.  He has a heart rate in the 50s at home and no symptoms related to this. The HR is slower today and he does not notice this.   No Known Allergies  Current Outpatient Prescriptions  Medication Sig Dispense Refill  . acetaminophen (TYLENOL) 500 MG tablet Take 500 mg by mouth every 6 (six) hours as needed (Pain).    . calcium carbonate 200 MG capsule Take 800 mg by mouth daily.    . cholecalciferol (VITAMIN D) 1000 UNITS tablet Take 1,000 Units by mouth daily.    . Cyanocobalamin (VITAMIN B 12 PO) Take 1,000 mg by mouth daily.    . famotidine (PEPCID AC) 10 MG chewable tablet Chew 10 mg by mouth at bedtime.    . famotidine (PEPCID) 20 MG tablet Take 20 mg by mouth daily.     Marland Kitchen ibandronate (BONIVA) 150 MG tablet Take 150 mg by mouth every 30 (thirty) days. Take in the morning with a full glass of water, on an empty stomach, and do not take anything else by mouth or lie down for the next 30 min.    . Multiple Vitamins-Minerals (VISION-VITE PRESERVE PO) Take 2 capsules by mouth daily.    Marland Kitchen MYRBETRIQ 50 MG TB24 Take 1 tablet by mouth daily. Takes at PM    . Omega-3 Fatty Acids (FISH OIL) 1200 MG CAPS Take 1 capsule by mouth daily.    Marland Kitchen terazosin (HYTRIN) 2 MG capsule Take 2 mg by mouth at bedtime.      . vitamin C (ASCORBIC ACID) 500 MG tablet Take 500 mg by mouth daily.    Marland Kitchen warfarin (COUMADIN) 5 MG tablet TAKE 1 TABLET BY MOUTH DAILY AS DIRECTED BY COUMADIN CLINIC 90 tablet 1  . ramipril (ALTACE) 5 MG capsule TAKE 1 CAPSULE(5 MG) BY MOUTH TWICE DAILY 60 capsule 9   No current facility-administered  medications for this visit.     Past Medical History:  Diagnosis Date  . Arthritis   . Atrial fibrillation ([place])   . Diverticulosis    history  . Dysphagia 2001   history s/p esophageal dilatation  . Dysrhythmia    hx. Atrial Fibrillation, RBBB  . GERD (gastroesophageal reflux disease)   . Hearing loss    bilateral hearing aids  . Hyperlipidemia   . Hypertension    benign  . Lung infiltrate 05/2010   left lower lobe infiltrate, questionable pneumonia  . Prostate cancer [facility]) 2001   treated with radiation    Past Surgical History:  Procedure Laterality Date  . CATARACT EXTRACTION  2008  . ESOPHAGEAL DILATION  2001  . EYE SURGERY    . JOINT REPLACEMENT     LTKA  . PROSTATE SURGERY    . REPLACEMENT TOTAL KNEE  2012   left  . TONSILLECTOMY    . TOTAL KNEE ARTHROPLASTY Right 08/19/2013   Procedure: RIGHT TOTAL KNEE ARTHROPLASTY;  Surgeon: Gearlean Alf, MD;  Location: [facility];  Service: Orthopedics;  Laterality: Right;  Marland Kitchen VASECTOMY      ROS:  As stated in the HPI and negative for all other systems.  PHYSICAL EXAM BP 140/78   Pulse (!) 36   Ht 5\' 10"  (1.778 m)   Wt 156 lb (70.8 kg)   BMI 22.38 kg/m   GENERAL:  Well appearing NECK:  No jugular venous distention, waveform within normal limits, carotid upstroke brisk and symmetric, no bruits, no thyromegaly LUNGS:  Clear to auscultation bilaterally CHEST:  Unremarkable HEART:  PMI not displaced or sustained,S1 and S2 within normal limits, no S3,  no clicks, no rubs, soft apical systolic murmur, no diastolic murmurs, irregular ABD:  Flat, positive bowel sounds normal in frequency in pitch, no bruits, no rebound, no guarding, no midline pulsatile mass, no hepatomegaly, no splenomegaly EXT:  2 plus pulses throughout, trace edema, chronic venous stasis changes,  no cyanosis no clubbing  EKG:  Atrial fibrillation, rate 36, left axis deviation, poor anterior R wave progression, no acute ST-T wave changes.  ASSESSMENT AND  PLAN  ATRIAL FIB:   Mr. DORSE LOCY has a CHA2DS2 - VASc score of 3 with a risk of stroke of 3.2%.  He has a slow ventricular rate but it is not causing any problems.  He did have an increase in HR with ambulation.  No indication for pacing which he would not be inclined to want unless he was having syncope.  I did review labs from Thressa Sheller, MD and he had a normal CBC.    HTN:  His blood pressure is well controlled.   No change in therapy.

## 2016-11-07 ENCOUNTER — Ambulatory Visit (INDEPENDENT_AMBULATORY_CARE_PROVIDER_SITE_OTHER): Payer: Medicare Other | Admitting: Pharmacist Clinician (PhC)/ Clinical Pharmacy Specialist

## 2016-11-07 ENCOUNTER — Encounter: Payer: Self-pay | Admitting: Cardiology

## 2016-11-07 ENCOUNTER — Ambulatory Visit (INDEPENDENT_AMBULATORY_CARE_PROVIDER_SITE_OTHER): Payer: Medicare Other | Admitting: Cardiology

## 2016-11-07 ENCOUNTER — Other Ambulatory Visit: Payer: Self-pay | Admitting: Cardiology

## 2016-11-07 VITALS — BP 140/78 | HR 36 | Ht 70.0 in | Wt 156.0 lb

## 2016-11-07 DIAGNOSIS — I482 Chronic atrial fibrillation, unspecified: Secondary | ICD-10-CM

## 2016-11-07 DIAGNOSIS — Z7901 Long term (current) use of anticoagulants: Secondary | ICD-10-CM

## 2016-11-07 DIAGNOSIS — I10 Essential (primary) hypertension: Secondary | ICD-10-CM

## 2016-11-07 DIAGNOSIS — I4891 Unspecified atrial fibrillation: Secondary | ICD-10-CM | POA: Diagnosis not present

## 2016-11-07 LAB — POCT INR: INR: 2.4

## 2016-11-07 NOTE — Patient Instructions (Addendum)
Medication Instructions:  Continue current medications  Labwork: None Ordered  Testing/Procedures: None Ordered  Follow-Up: Your physician wants you to follow-up in: 6 Months. You will receive a reminder letter in the mail two months in advance. If you don't receive a letter, please call our office to schedule the follow-up appointment.   Any Other Special Instructions Will Be Listed Below (If Applicable).    If you need a refill on your cardiac medications before your next appointment, please call your pharmacy.   Thank you for choosing [facility]!!

## 2016-11-07 NOTE — Telephone Encounter (Signed)
REFILL 

## 2016-11-08 ENCOUNTER — Encounter: Payer: Self-pay | Admitting: Cardiology

## 2016-12-19 ENCOUNTER — Ambulatory Visit (INDEPENDENT_AMBULATORY_CARE_PROVIDER_SITE_OTHER): Payer: Medicare Other | Admitting: Pharmacist

## 2016-12-19 DIAGNOSIS — Z7901 Long term (current) use of anticoagulants: Secondary | ICD-10-CM

## 2016-12-19 DIAGNOSIS — I4891 Unspecified atrial fibrillation: Secondary | ICD-10-CM

## 2016-12-19 LAB — POCT INR: INR: 3.1

## 2017-01-16 ENCOUNTER — Ambulatory Visit (INDEPENDENT_AMBULATORY_CARE_PROVIDER_SITE_OTHER): Payer: Medicare Other | Admitting: Pharmacist

## 2017-01-16 DIAGNOSIS — Z7901 Long term (current) use of anticoagulants: Secondary | ICD-10-CM

## 2017-01-16 DIAGNOSIS — I4891 Unspecified atrial fibrillation: Secondary | ICD-10-CM

## 2017-01-16 LAB — POCT INR: INR: 3

## 2017-02-06 ENCOUNTER — Other Ambulatory Visit: Payer: Self-pay | Admitting: Cardiology

## 2017-02-27 ENCOUNTER — Ambulatory Visit (INDEPENDENT_AMBULATORY_CARE_PROVIDER_SITE_OTHER): Payer: Medicare Other | Admitting: Pharmacist Clinician (PhC)/ Clinical Pharmacy Specialist

## 2017-02-27 DIAGNOSIS — Z7901 Long term (current) use of anticoagulants: Secondary | ICD-10-CM

## 2017-02-27 DIAGNOSIS — I4891 Unspecified atrial fibrillation: Secondary | ICD-10-CM

## 2017-02-27 LAB — POCT INR: INR: 2.6

## 2017-03-03 ENCOUNTER — Encounter: Payer: Self-pay | Admitting: Podiatry

## 2017-03-03 ENCOUNTER — Ambulatory Visit: Payer: Medicare Other | Admitting: Podiatry

## 2017-03-03 DIAGNOSIS — M79676 Pain in unspecified toe(s): Secondary | ICD-10-CM | POA: Diagnosis not present

## 2017-03-03 DIAGNOSIS — B351 Tinea unguium: Secondary | ICD-10-CM

## 2017-03-03 DIAGNOSIS — M19072 Primary osteoarthritis, left ankle and foot: Secondary | ICD-10-CM

## 2017-03-03 NOTE — Progress Notes (Signed)
Patient ID: Gregory Werner, male   DOB: [birth year], 83 y.o.   MRN: [id number] Complaint:  Visit Type: Patient returns to my office for continued preventative foot care services. Complaint: Patient states" my nails have grown long and thick and become painful to walk and wear shoes" . The patient presents for preventative foot care services. No changes to ROS  Podiatric Exam: Vascular: dorsalis pedis and posterior tibial pulses are palpable bilateral. Capillary return is immediate. Temperature gradient is WNL. Skin turgor WNL  Sensorium: Normal Semmes Weinstein monofilament test. Normal tactile sensation bilaterally. Nail Exam: Pt has thick disfigured discolored nails with subungual debris noted bilateral entire nail hallux through fifth toenails Ulcer Exam: There is no evidence of ulcer or pre-ulcerative changes or infection. Orthopedic Exam: Muscle tone and strength are WNL. No limitations in general ROM. No crepitus or effusions noted. Foot type and digits show no abnormalities. Bony prominences over left midfoot. 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 Skin: No Porokeratosis. No infection or ulcers  Diagnosis:  Onychomycosis, , Pain in right toe, pain in left toes  Treatment & Plan Procedures and Treatment: Consent by patient was obtained for treatment procedures. The patient understood the discussion of treatment and procedures well. All questions were answered thoroughly reviewed. Debridement of mycotic and hypertrophic toenails, 1 through 5 bilateral and clearing of subungual debris. No ulceration, no infection noted.  Return Visit-Office Procedure: Patient instructed to return to the office for a follow up visit 3 months for continued evaluation and treatment.  Gardiner Barefoot DPM

## 2017-04-10 ENCOUNTER — Ambulatory Visit (INDEPENDENT_AMBULATORY_CARE_PROVIDER_SITE_OTHER): Payer: Medicare Other | Admitting: Pharmacist

## 2017-04-10 DIAGNOSIS — Z7901 Long term (current) use of anticoagulants: Secondary | ICD-10-CM | POA: Diagnosis not present

## 2017-04-10 DIAGNOSIS — I4891 Unspecified atrial fibrillation: Secondary | ICD-10-CM

## 2017-04-10 LAB — POCT INR: INR: 2.2

## 2017-05-01 ENCOUNTER — Ambulatory Visit (INDEPENDENT_AMBULATORY_CARE_PROVIDER_SITE_OTHER): Payer: Medicare Other | Admitting: Pharmacist

## 2017-05-01 DIAGNOSIS — I4891 Unspecified atrial fibrillation: Secondary | ICD-10-CM

## 2017-05-01 DIAGNOSIS — Z7901 Long term (current) use of anticoagulants: Secondary | ICD-10-CM | POA: Diagnosis not present

## 2017-05-01 LAB — POCT INR: INR: 2.1

## 2017-05-07 NOTE — Progress Notes (Signed)
HPI The patient presents for follow up of atrial fib.  Since I last saw him he has done well.  He still does water aerobics and does his walking at home.  He denies new symptoms such as chest pressure, neck or arm discomfort.  He had no palpitations, presyncope or syncope.  He tolerates anticoagulation.  He has had a chronically slow heart rate that does go up with ambulation and has had no symptoms related to this.  No Known Allergies  Current Outpatient Medications  Medication Sig Dispense Refill  . acetaminophen (TYLENOL) 500 MG tablet Take 500 mg by mouth every 6 (six) hours as needed (Pain).    . calcium carbonate 200 MG capsule Take 800 mg by mouth daily.    . cholecalciferol (VITAMIN D) 1000 UNITS tablet Take 1,000 Units by mouth daily.    . Cyanocobalamin (VITAMIN B 12 PO) Take 1,000 mg by mouth daily.    . famotidine (PEPCID AC) 10 MG chewable tablet Chew 10 mg by mouth at bedtime.    . famotidine (PEPCID) 20 MG tablet Take 20 mg by mouth daily.     . Multiple Vitamins-Minerals (VISION-VITE PRESERVE PO) Take 2 capsules by mouth daily.    Marland Kitchen MYRBETRIQ 50 MG TB24 Take 1 tablet by mouth daily. Takes at PM    . Omega-3 Fatty Acids (FISH OIL) 1200 MG CAPS Take 1 capsule by mouth daily.    . ramipril (ALTACE) 5 MG capsule TAKE 1 CAPSULE(5 MG) BY MOUTH TWICE DAILY 60 capsule 9  . terazosin (HYTRIN) 2 MG capsule Take 2 mg by mouth at bedtime.      . vitamin C (ASCORBIC ACID) 500 MG tablet Take 500 mg by mouth daily.    Marland Kitchen warfarin (COUMADIN) 5 MG tablet Take 1/2 to 1 tablet by mouth daily as directed by coumadin clinic 90 tablet 1   No current facility-administered medications for this visit.     Past Medical History:  Diagnosis Date  . Arthritis   . Atrial fibrillation ([place])   . Diverticulosis    history  . Dysphagia 2001   history s/p esophageal dilatation  . Dysrhythmia    hx. Atrial Fibrillation, RBBB  . GERD (gastroesophageal reflux disease)   . Hearing loss    bilateral  hearing aids  . Hyperlipidemia   . Hypertension    benign  . Lung infiltrate 05/2010   left lower lobe infiltrate, questionable pneumonia  . Prostate cancer [facility]) 2001   treated with radiation    Past Surgical History:  Procedure Laterality Date  . CATARACT EXTRACTION  2008  . ESOPHAGEAL DILATION  2001  . EYE SURGERY    . JOINT REPLACEMENT     LTKA  . PROSTATE SURGERY    . REPLACEMENT TOTAL KNEE  2012   left  . TONSILLECTOMY    . TOTAL KNEE ARTHROPLASTY Right 08/19/2013   Procedure: RIGHT TOTAL KNEE ARTHROPLASTY;  Surgeon: Gearlean Alf, MD;  Location: [facility];  Service: Orthopedics;  Laterality: Right;  Marland Kitchen VASECTOMY      ROS:    As stated in the HPI and negative for all other systems.  PHYSICAL EXAM BP (!) 156/69   Pulse (!) 51   Ht 5\' 10"  (1.778 m)   Wt 159 lb (72.1 kg)   BMI 22.81 kg/m   GENERAL:  Well appearing for his age NECK:  No jugular venous distention, waveform within normal limits, carotid upstroke brisk and symmetric, no bruits, no thyromegaly  LUNGS:  Clear to auscultation bilaterally CHEST:  Unremarkable HEART:  PMI not displaced or sustained,S1 and S2 within normal limits, no S3,  no clicks, no rubs, 2 out of 6 apical systolic murmur nonradiating, no diastolic murmurs, irregular.   ABD:  Flat, positive bowel sounds normal in frequency in pitch, no bruits, no rebound, no guarding, no midline pulsatile mass, no hepatomegaly, no splenomegaly EXT:  2 plus pulses throughout, no edema, no cyanosis no clubbing  EKG:  NA  ASSESSMENT AND PLAN  ATRIAL FIB:   Mr. Gregory Werner has a CHA2DS2 - VASc score of 3 with a risk of stroke of 3.2%  .  He has a slow heart rate but does not have any problems with this.  He gets his labs routinely followed by Dr. Maudie Mercury.  I did review the last CBC is normal.  He will continue on the meds as listed.   HTN:  His blood pressure is slightly elevated but it is typically in the 130-140 range with normal diastolics at home.  No change in  therapy.   MR: He has had moderate mitral regurgitation in the past.  I don't think he would be a candidate for repair of this and

## 2017-05-08 ENCOUNTER — Encounter: Payer: Self-pay | Admitting: Cardiology

## 2017-05-08 ENCOUNTER — Ambulatory Visit: Payer: Medicare Other | Admitting: Cardiology

## 2017-05-08 VITALS — BP 156/69 | HR 51 | Ht 70.0 in | Wt 159.0 lb

## 2017-05-08 DIAGNOSIS — I340 Nonrheumatic mitral (valve) insufficiency: Secondary | ICD-10-CM | POA: Diagnosis not present

## 2017-05-08 DIAGNOSIS — I10 Essential (primary) hypertension: Secondary | ICD-10-CM

## 2017-05-08 DIAGNOSIS — I4821 Permanent atrial fibrillation: Secondary | ICD-10-CM

## 2017-05-08 DIAGNOSIS — I482 Chronic atrial fibrillation: Secondary | ICD-10-CM

## 2017-05-08 NOTE — Patient Instructions (Signed)
Medication Instructions:  Continue current medications  If you need a refill on your cardiac medications before your next appointment, please call your pharmacy.  Labwork: None Ordered   Testing/Procedures: None Ordered  Follow-Up: Your physician wants you to follow-up in: 1 Year. You should receive a reminder letter in the mail two months in advance. If you do not receive a letter, please call our office [phone].    Thank you for choosing [facility]!!      

## 2017-06-02 ENCOUNTER — Ambulatory Visit: Payer: Medicare Other | Admitting: Podiatry

## 2017-06-12 ENCOUNTER — Ambulatory Visit (INDEPENDENT_AMBULATORY_CARE_PROVIDER_SITE_OTHER): Payer: Medicare Other | Admitting: Pharmacist

## 2017-06-12 DIAGNOSIS — I4891 Unspecified atrial fibrillation: Secondary | ICD-10-CM

## 2017-06-12 DIAGNOSIS — I4821 Permanent atrial fibrillation: Secondary | ICD-10-CM

## 2017-06-12 DIAGNOSIS — Z7901 Long term (current) use of anticoagulants: Secondary | ICD-10-CM

## 2017-06-12 LAB — POCT INR: INR: 2.2

## 2017-06-14 ENCOUNTER — Ambulatory Visit: Payer: Medicare Other | Admitting: Podiatry

## 2017-06-21 ENCOUNTER — Ambulatory Visit: Payer: Medicare Other | Admitting: Podiatry

## 2017-06-21 ENCOUNTER — Encounter: Payer: Self-pay | Admitting: Podiatry

## 2017-06-21 DIAGNOSIS — M79676 Pain in unspecified toe(s): Secondary | ICD-10-CM | POA: Diagnosis not present

## 2017-06-21 DIAGNOSIS — D689 Coagulation defect, unspecified: Secondary | ICD-10-CM

## 2017-06-21 DIAGNOSIS — B351 Tinea unguium: Secondary | ICD-10-CM | POA: Diagnosis not present

## 2017-06-21 NOTE — Progress Notes (Signed)
Patient ID: Gregory Werner, male   DOB: 04/03/1925, 84 y.o.   MRN: [id number] Complaint:  Visit Type: Patient returns to my office for continued preventative foot care services. Complaint: Patient states" my nails have grown long and thick and become painful to walk and wear shoes" . The patient presents for preventative foot care services. No changes to ROS.  Patient is taking coumadin.  Podiatric Exam: Vascular: dorsalis pedis and posterior tibial pulses are palpable bilateral. Capillary return is immediate. Temperature gradient is WNL. Skin turgor WNL  Sensorium: Normal Semmes Weinstein monofilament test. Normal tactile sensation bilaterally. Nail Exam: Pt has thick disfigured discolored nails with subungual debris noted bilateral entire nail hallux through fifth toenails Ulcer Exam: There is no evidence of ulcer or pre-ulcerative changes or infection. Orthopedic Exam: Muscle tone and strength are WNL. No limitations in general ROM. No crepitus or effusions noted. Foot type and digits show no abnormalities. Bony prominences over left midfoot. 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 Skin: No Porokeratosis. No infection or ulcers  Diagnosis:  Onychomycosis, , Pain in right toe, pain in left toes  Treatment & Plan Procedures and Treatment: Consent by patient was obtained for treatment procedures. The patient understood the discussion of treatment and procedures well. All questions were answered thoroughly reviewed. Debridement of mycotic and hypertrophic toenails, 1 through 5 bilateral and clearing of subungual debris. No ulceration, no infection noted.  Return Visit-Office Procedure: Patient instructed to return to the office for a follow up visit 3 months for continued evaluation and treatment.  Nilay Mangrum DPM 

## 2017-07-24 ENCOUNTER — Ambulatory Visit (INDEPENDENT_AMBULATORY_CARE_PROVIDER_SITE_OTHER): Payer: Medicare Other | Admitting: Pharmacist

## 2017-07-24 DIAGNOSIS — I482 Chronic atrial fibrillation: Secondary | ICD-10-CM

## 2017-07-24 DIAGNOSIS — I4821 Permanent atrial fibrillation: Secondary | ICD-10-CM

## 2017-07-24 DIAGNOSIS — Z7901 Long term (current) use of anticoagulants: Secondary | ICD-10-CM | POA: Diagnosis not present

## 2017-07-24 DIAGNOSIS — I4891 Unspecified atrial fibrillation: Secondary | ICD-10-CM | POA: Diagnosis not present

## 2017-07-24 LAB — POCT INR: INR: 2.1

## 2017-08-31 ENCOUNTER — Other Ambulatory Visit: Payer: Self-pay | Admitting: Cardiology

## 2017-09-04 ENCOUNTER — Ambulatory Visit (INDEPENDENT_AMBULATORY_CARE_PROVIDER_SITE_OTHER): Payer: Medicare Other | Admitting: Pharmacist

## 2017-09-04 DIAGNOSIS — I4891 Unspecified atrial fibrillation: Secondary | ICD-10-CM | POA: Diagnosis not present

## 2017-09-04 DIAGNOSIS — I482 Chronic atrial fibrillation: Secondary | ICD-10-CM | POA: Diagnosis not present

## 2017-09-04 DIAGNOSIS — I4821 Permanent atrial fibrillation: Secondary | ICD-10-CM

## 2017-09-04 DIAGNOSIS — Z7901 Long term (current) use of anticoagulants: Secondary | ICD-10-CM

## 2017-09-04 LAB — POCT INR: INR: 4.9 — AB (ref 2.0–3.0)

## 2017-09-04 NOTE — Patient Instructions (Signed)
HOLD warfarin dose today 09/04/17 and tomorrow 09/05/17, then resume  taking 1 tablet daily except 1/2 tablet each Monday, Wednesday and Friday, repeat INR in 2 weeks.

## 2017-09-11 ENCOUNTER — Ambulatory Visit (INDEPENDENT_AMBULATORY_CARE_PROVIDER_SITE_OTHER): Payer: Medicare Other | Admitting: Pharmacist

## 2017-09-11 DIAGNOSIS — Z7901 Long term (current) use of anticoagulants: Secondary | ICD-10-CM

## 2017-09-11 DIAGNOSIS — I4891 Unspecified atrial fibrillation: Secondary | ICD-10-CM | POA: Diagnosis not present

## 2017-09-11 LAB — POCT INR: INR: 4.5 — AB (ref 2.0–3.0)

## 2017-09-22 ENCOUNTER — Ambulatory Visit: Payer: Medicare Other | Admitting: Podiatry

## 2017-09-26 ENCOUNTER — Encounter: Payer: Self-pay | Admitting: Podiatry

## 2017-09-26 ENCOUNTER — Ambulatory Visit: Payer: Medicare Other | Admitting: Podiatry

## 2017-09-26 DIAGNOSIS — M19072 Primary osteoarthritis, left ankle and foot: Secondary | ICD-10-CM

## 2017-09-26 DIAGNOSIS — D689 Coagulation defect, unspecified: Secondary | ICD-10-CM

## 2017-09-26 DIAGNOSIS — M79676 Pain in unspecified toe(s): Secondary | ICD-10-CM | POA: Diagnosis not present

## 2017-09-26 DIAGNOSIS — B351 Tinea unguium: Secondary | ICD-10-CM

## 2017-09-26 NOTE — Progress Notes (Signed)
Patient ID: Gregory Werner, male   DOB: [birth year], 84 y.o.   MRN: [id number] Complaint:  Visit Type: Patient returns to my office for continued preventative foot care services. Complaint: Patient states" my nails have grown long and thick and become painful to walk and wear shoes" . The patient presents for preventative foot care services. No changes to ROS.  Patient is taking coumadin.  Podiatric Exam: Vascular: dorsalis pedis and posterior tibial pulses are palpable bilateral. Capillary return is immediate. Temperature gradient is WNL. Skin turgor WNL  Sensorium: Normal Semmes Weinstein monofilament test. Normal tactile sensation bilaterally. Nail Exam: Pt has thick disfigured discolored nails with subungual debris noted bilateral entire nail hallux through fifth toenails Ulcer Exam: There is no evidence of ulcer or pre-ulcerative changes or infection. Orthopedic Exam: Muscle tone and strength are WNL. No limitations in general ROM. No crepitus or effusions noted. Foot type and digits show no abnormalities. Bony prominences over left midfoot. 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 Skin: No Porokeratosis. No infection or ulcers  Diagnosis:  Onychomycosis, , Pain in right toe, pain in left toes  Treatment & Plan Procedures and Treatment: Consent by patient was obtained for treatment procedures. The patient understood the discussion of treatment and procedures well. All questions were answered thoroughly reviewed. Debridement of mycotic and hypertrophic toenails, 1 through 5 bilateral and clearing of subungual debris. No ulceration, no infection noted.  Return Visit-Office Procedure: Patient instructed to return to the office for a follow up visit 3 months for continued evaluation and treatment.  Gardiner Barefoot DPM

## 2017-10-02 ENCOUNTER — Ambulatory Visit (INDEPENDENT_AMBULATORY_CARE_PROVIDER_SITE_OTHER): Payer: Medicare Other | Admitting: Pharmacist

## 2017-10-02 DIAGNOSIS — I4891 Unspecified atrial fibrillation: Secondary | ICD-10-CM

## 2017-10-02 DIAGNOSIS — Z7901 Long term (current) use of anticoagulants: Secondary | ICD-10-CM | POA: Diagnosis not present

## 2017-10-02 LAB — POCT INR: INR: 1.9 — AB (ref 2.0–3.0)

## 2017-10-03 ENCOUNTER — Other Ambulatory Visit: Payer: Self-pay | Admitting: Cardiology

## 2017-10-30 ENCOUNTER — Ambulatory Visit (INDEPENDENT_AMBULATORY_CARE_PROVIDER_SITE_OTHER): Payer: Medicare Other | Admitting: Pharmacist Clinician (PhC)/ Clinical Pharmacy Specialist

## 2017-10-30 DIAGNOSIS — I4891 Unspecified atrial fibrillation: Secondary | ICD-10-CM | POA: Diagnosis not present

## 2017-10-30 DIAGNOSIS — Z7901 Long term (current) use of anticoagulants: Secondary | ICD-10-CM

## 2017-10-30 LAB — POCT INR: INR: 2.3 (ref 2.0–3.0)

## 2017-12-11 ENCOUNTER — Ambulatory Visit (INDEPENDENT_AMBULATORY_CARE_PROVIDER_SITE_OTHER): Payer: Medicare Other | Admitting: Pharmacist Clinician (PhC)/ Clinical Pharmacy Specialist

## 2017-12-11 DIAGNOSIS — I4891 Unspecified atrial fibrillation: Secondary | ICD-10-CM | POA: Diagnosis not present

## 2017-12-11 DIAGNOSIS — Z7901 Long term (current) use of anticoagulants: Secondary | ICD-10-CM

## 2017-12-11 LAB — POCT INR: INR: 2.1 (ref 2.0–3.0)

## 2017-12-11 NOTE — Patient Instructions (Signed)
Description   Continue 1 tablet daily except 1/2 tablet each Monday, Wednesday and Friday, repeat INR in 6 weeks.

## 2017-12-27 ENCOUNTER — Ambulatory Visit: Payer: Medicare Other | Admitting: Podiatry

## 2017-12-27 ENCOUNTER — Encounter: Payer: Self-pay | Admitting: Podiatry

## 2017-12-27 DIAGNOSIS — D689 Coagulation defect, unspecified: Secondary | ICD-10-CM | POA: Diagnosis not present

## 2017-12-27 DIAGNOSIS — B351 Tinea unguium: Secondary | ICD-10-CM | POA: Diagnosis not present

## 2017-12-27 DIAGNOSIS — M79676 Pain in unspecified toe(s): Secondary | ICD-10-CM | POA: Diagnosis not present

## 2017-12-27 DIAGNOSIS — M19072 Primary osteoarthritis, left ankle and foot: Secondary | ICD-10-CM

## 2017-12-27 NOTE — Progress Notes (Signed)
Patient ID: Gregory Werner, male   DOB: 08-Jan-1925, 84 y.o.   MRN: [id number] Complaint:  Visit Type: Patient returns to my office for continued preventative foot care services. Complaint: Patient states" my nails have grown long and thick and become painful to walk and wear shoes" . The patient presents for preventative foot care services. No changes to ROS.  Patient is taking coumadin.  Podiatric Exam: Vascular: dorsalis pedis and posterior tibial pulses are palpable bilateral. Capillary return is immediate. Temperature gradient is WNL. Skin turgor WNL  Sensorium: Normal Semmes Weinstein monofilament test. Normal tactile sensation bilaterally. Nail Exam: Pt has thick disfigured discolored nails with subungual debris noted bilateral entire nail hallux through fifth toenails Ulcer Exam: There is no evidence of ulcer or pre-ulcerative changes or infection. Orthopedic Exam: Muscle tone and strength are WNL. No limitations in general ROM. No crepitus or effusions noted. Foot type and digits show no abnormalities. Bony prominences over left midfoot. 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 Skin: No Porokeratosis. No infection or ulcers  Diagnosis:  Onychomycosis, , Pain in right toe, pain in left toes  Treatment & Plan Procedures and Treatment: Consent by patient was obtained for treatment procedures. The patient understood the discussion of treatment and procedures well. All questions were answered thoroughly reviewed. Debridement of mycotic and hypertrophic toenails, 1 through 5 bilateral and clearing of subungual debris. No ulceration, no infection noted.  Return Visit-Office Procedure: Patient instructed to return to the office for a follow up visit 3 months for continued evaluation and treatment.  Gardiner Barefoot DPM

## 2018-01-22 ENCOUNTER — Ambulatory Visit (INDEPENDENT_AMBULATORY_CARE_PROVIDER_SITE_OTHER): Payer: Medicare Other | Admitting: Pharmacist Clinician (PhC)/ Clinical Pharmacy Specialist

## 2018-01-22 DIAGNOSIS — I4891 Unspecified atrial fibrillation: Secondary | ICD-10-CM | POA: Diagnosis not present

## 2018-01-22 DIAGNOSIS — Z7901 Long term (current) use of anticoagulants: Secondary | ICD-10-CM

## 2018-01-22 LAB — POCT INR: INR: 2.6 (ref 2.0–3.0)

## 2018-01-22 NOTE — Patient Instructions (Signed)
Description   Continue 1 tablet daily except 1/2 tablet each Monday, Wednesday and Friday, repeat INR in 6 weeks.

## 2018-03-05 ENCOUNTER — Encounter (INDEPENDENT_AMBULATORY_CARE_PROVIDER_SITE_OTHER): Payer: Self-pay

## 2018-03-05 ENCOUNTER — Ambulatory Visit: Payer: Medicare Other | Admitting: Pharmacist Clinician (PhC)/ Clinical Pharmacy Specialist

## 2018-03-05 DIAGNOSIS — I4891 Unspecified atrial fibrillation: Secondary | ICD-10-CM | POA: Diagnosis not present

## 2018-03-05 DIAGNOSIS — Z7901 Long term (current) use of anticoagulants: Secondary | ICD-10-CM

## 2018-03-05 LAB — POCT INR: INR: 3.5 — AB (ref 2.0–3.0)

## 2018-03-05 NOTE — Patient Instructions (Signed)
Description   Take only 1/2 tablet Tuesday Dec 3, then continue 1 tablet daily except 1/2 tablet each Monday, Wednesday and Friday, repeat INR in 4 weeks.

## 2018-03-23 ENCOUNTER — Ambulatory Visit (INDEPENDENT_AMBULATORY_CARE_PROVIDER_SITE_OTHER): Payer: Medicare Other | Admitting: Podiatry

## 2018-03-23 ENCOUNTER — Encounter: Payer: Self-pay | Admitting: Podiatry

## 2018-03-23 DIAGNOSIS — M79676 Pain in unspecified toe(s): Secondary | ICD-10-CM

## 2018-03-23 DIAGNOSIS — M19072 Primary osteoarthritis, left ankle and foot: Secondary | ICD-10-CM

## 2018-03-23 DIAGNOSIS — B351 Tinea unguium: Secondary | ICD-10-CM

## 2018-03-23 DIAGNOSIS — D689 Coagulation defect, unspecified: Secondary | ICD-10-CM

## 2018-03-23 NOTE — Progress Notes (Signed)
Patient ID: Gregory Werner, male   DOB: 07/03/1924, 84 y.o.   MRN: [id number] Complaint:  Visit Type: Patient returns to my office for continued preventative foot care services. Complaint: Patient states" my nails have grown long and thick and become painful to walk and wear shoes" . The patient presents for preventative foot care services. No changes to ROS.  Patient is taking coumadin.  Podiatric Exam: Vascular: dorsalis pedis and posterior tibial pulses are palpable bilateral. Capillary return is immediate. Temperature gradient is WNL. Skin turgor WNL  Sensorium: Normal Semmes Weinstein monofilament test. Normal tactile sensation bilaterally. Nail Exam: Pt has thick disfigured discolored nails with subungual debris noted bilateral entire nail hallux through fifth toenails Ulcer Exam: There is no evidence of ulcer or pre-ulcerative changes or infection. Orthopedic Exam: Muscle tone and strength are WNL. No limitations in general ROM. No crepitus or effusions noted. Foot type and digits show no abnormalities. Bony prominences over left midfoot. 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 Skin: No Porokeratosis. No infection or ulcers  Diagnosis:  Onychomycosis, , Pain in right toe, pain in left toes  Treatment & Plan Procedures and Treatment: Consent by patient was obtained for treatment procedures. The patient understood the discussion of treatment and procedures well. All questions were answered thoroughly reviewed. Debridement of mycotic and hypertrophic toenails, 1 through 5 bilateral and clearing of subungual debris. No ulceration, no infection noted.  Return Visit-Office Procedure: Patient instructed to return to the office for a follow up visit 3 months for continued evaluation and treatment.  Durward Matranga DPM 

## 2018-04-06 ENCOUNTER — Ambulatory Visit: Payer: Medicare Other | Admitting: Pharmacist

## 2018-04-06 DIAGNOSIS — Z7901 Long term (current) use of anticoagulants: Secondary | ICD-10-CM | POA: Diagnosis not present

## 2018-04-06 DIAGNOSIS — I4891 Unspecified atrial fibrillation: Secondary | ICD-10-CM

## 2018-04-06 LAB — POCT INR: INR: 2.3 (ref 2.0–3.0)

## 2018-04-16 ENCOUNTER — Other Ambulatory Visit: Payer: Self-pay | Admitting: *Deleted

## 2018-04-16 MED ORDER — RAMIPRIL 5 MG PO CAPS: 5.0000 mg | ORAL_CAPSULE | Freq: Two times a day (BID) | ORAL | 0 refills | Status: DC

## 2018-04-16 NOTE — Telephone Encounter (Signed)
Rx has been sent to the pharmacy electronically. ° °

## 2018-05-13 NOTE — Progress Notes (Signed)
HPI The patient presents for follow up of atrial fib.  Since I last saw him he gets around with his cane.  He is done well.  He still doing water aerobics.  He is teaching a class on opera. The patient denies any new symptoms such as chest discomfort, neck or arm discomfort. There has been no new shortness of breath, PND or orthopnea. There have been no reported palpitations, presyncope or syncope.  No Known Allergies  Current Outpatient Medications  Medication Sig Dispense Refill  . acetaminophen (TYLENOL) 500 MG tablet Take 500 mg by mouth every 6 (six) hours as needed (Pain).    . Bilberry, Vaccinium myrtillus, (BILBERRY EXTRACT PO) Take by mouth daily.    . calcium carbonate 200 MG capsule Take 800 mg by mouth daily.    . cholecalciferol (VITAMIN D) 1000 UNITS tablet Take 1,000 Units by mouth daily.    . Cyanocobalamin (VITAMIN B 12 PO) Take 1,000 mg by mouth daily.    . famotidine (PEPCID AC) 10 MG chewable tablet Chew 10 mg by mouth at bedtime.    . Multiple Vitamins-Minerals (VISION-VITE PRESERVE PO) Take 2 capsules by mouth daily.    Marland Kitchen MYRBETRIQ 50 MG TB24 Take 1 tablet by mouth daily. Takes at PM    . Omega-3 Fatty Acids (FISH OIL) 1200 MG CAPS Take 1 capsule by mouth daily.    . ramipril (ALTACE) 5 MG capsule Take 1 capsule (5 mg total) by mouth 2 (two) times daily. 180 capsule 0  . terazosin (HYTRIN) 2 MG capsule Take 2 mg by mouth at bedtime.      Marland Kitchen UNABLE TO FIND Take by mouth.    . vitamin C (ASCORBIC ACID) 500 MG tablet Take 500 mg by mouth daily.    Marland Kitchen warfarin (COUMADIN) 5 MG tablet TAKE 1/2 TO 1 TABLET BY MOUTH DAILY AS DIRECTED BY COUMADIN CLINIC 90 tablet 1   No current facility-administered medications for this visit.     Past Medical History:  Diagnosis Date  . Arthritis   . Atrial fibrillation ([place])   . Diverticulosis    history  . Dysphagia 2001   history s/p esophageal dilatation  . Dysrhythmia    hx. Atrial Fibrillation, RBBB  . GERD  (gastroesophageal reflux disease)   . Hearing loss    bilateral hearing aids  . Hyperlipidemia   . Hypertension    benign  . Lung infiltrate 05/2010   left lower lobe infiltrate, questionable pneumonia  . Prostate cancer [facility]) 2001   treated with radiation    Past Surgical History:  Procedure Laterality Date  . CATARACT EXTRACTION  2008  . ESOPHAGEAL DILATION  2001  . EYE SURGERY    . JOINT REPLACEMENT     LTKA  . PROSTATE SURGERY    . REPLACEMENT TOTAL KNEE  2012   left  . TONSILLECTOMY    . TOTAL KNEE ARTHROPLASTY Right 08/19/2013   Procedure: RIGHT TOTAL KNEE ARTHROPLASTY;  Surgeon: Gearlean Alf, MD;  Location: [facility];  Service: Orthopedics;  Laterality: Right;  Marland Kitchen VASECTOMY      ROS:   As stated in the HPI and negative for all other systems.  PHYSICAL EXAM BP 128/76   Pulse (!) 47   Ht 5\' 10"  (1.778 m)   Wt 160 lb 6.4 oz (72.8 kg)   BMI 23.02 kg/m   GENERAL:  Well appearing NECK:  No jugular venous distention, waveform within normal limits, carotid upstroke brisk and  symmetric, no bruits, no thyromegaly LUNGS:  Clear to auscultation bilaterally CHEST:  Unremarkable HEART:  PMI not displaced or sustained,S1 and S2 within normal limits, no S3, no clicks, no rubs, 2 out of 6 apical systolic murmur radiating slightly at the aortic outflow tract and to the axilla, no diastolic murmurs, irregular ABD:  Flat, positive bowel sounds normal in frequency in pitch, no bruits, no rebound, no guarding, no midline pulsatile mass, no hepatomegaly, no splenomegaly EXT:  2 plus pulses throughout, leg swelling left greater than right leg with mild left leg erythema, no cyanosis no clubbing   EKG: Atrial fibrillation, rate 47, slow ventricular rate, premature ventricular contractions, poor anterior R wave progression, left axis deviation, left ventricular hypertrophy by voltage criteria  ASSESSMENT AND PLAN  ATRIAL FIB:   Mr. Meir Elwood Buford has a CHA2DS2 - VASc score of 3 with a risk  of stroke of 3.2.  He tolerates anticoagulation.  He has no symptoms related to the slow rate.  No change in therapy is indicated.   HTN:  His blood pressure is at target.  No change in therapy.   MR: He has had moderate mitral regurgitation in the past.  I will manage this medically.

## 2018-05-14 ENCOUNTER — Ambulatory Visit: Payer: Medicare Other | Admitting: Cardiology

## 2018-05-14 ENCOUNTER — Encounter: Payer: Self-pay | Admitting: Cardiology

## 2018-05-14 ENCOUNTER — Ambulatory Visit (INDEPENDENT_AMBULATORY_CARE_PROVIDER_SITE_OTHER): Payer: Medicare Other | Admitting: *Deleted

## 2018-05-14 VITALS — BP 128/76 | HR 47 | Ht 70.0 in | Wt 160.4 lb

## 2018-05-14 DIAGNOSIS — I4891 Unspecified atrial fibrillation: Secondary | ICD-10-CM

## 2018-05-14 DIAGNOSIS — I10 Essential (primary) hypertension: Secondary | ICD-10-CM

## 2018-05-14 DIAGNOSIS — Z7901 Long term (current) use of anticoagulants: Secondary | ICD-10-CM | POA: Diagnosis not present

## 2018-05-14 DIAGNOSIS — I4821 Permanent atrial fibrillation: Secondary | ICD-10-CM | POA: Diagnosis not present

## 2018-05-14 DIAGNOSIS — I340 Nonrheumatic mitral (valve) insufficiency: Secondary | ICD-10-CM | POA: Diagnosis not present

## 2018-05-14 LAB — POCT INR: INR: 1.8 — AB (ref 2.0–3.0)

## 2018-05-14 NOTE — Patient Instructions (Signed)
Description   Today take an extra 1/2 tablet, then Continue 1 tablet daily except 1/2 tablet each Monday, Wednesday and Friday, repeat INR in 3 weeks.

## 2018-05-14 NOTE — Patient Instructions (Signed)
Medication Instructions:  Continue current medications  If you need a refill on your cardiac medications before your next appointment, please call your pharmacy.  Labwork: None Ordered   Take the provided lab slips with you to the lab for your blood draw.   When you have your labs (blood work) drawn today and your tests are completely normal, you will receive your results only by MyChart Message (if you have MyChart) -OR-  A paper copy in the mail.  If you have any lab test that is abnormal or we need to change your treatment, we will call you to review these results.  Testing/Procedures: None Ordered   Follow-Up: You will need a follow up appointment in 1 Year.  Please call our office 2 months in advance to schedule this appointment.  You may see Dr Hochrein or one of the following Advanced Practice Providers on your designated Care Team:   Rhonda Barrett, PA-C . Kathryn Lawrence, DNP, ANP    At [facility], you and your health needs are our priority.  As part of our continuing mission to provide you with exceptional heart care, we have created designated Provider Care Teams.  These Care Teams include your primary Cardiologist (physician) and Advanced Practice Providers (APPs -  Physician Assistants and Nurse Practitioners) who all work together to provide you with the care you need, when you need it.   Thank you for choosing [facility]!!     

## 2018-05-22 ENCOUNTER — Other Ambulatory Visit: Payer: Self-pay | Admitting: Cardiology

## 2018-06-04 ENCOUNTER — Ambulatory Visit: Payer: Medicare Other | Admitting: *Deleted

## 2018-06-04 DIAGNOSIS — I4891 Unspecified atrial fibrillation: Secondary | ICD-10-CM

## 2018-06-04 DIAGNOSIS — Z7901 Long term (current) use of anticoagulants: Secondary | ICD-10-CM

## 2018-06-04 LAB — POCT INR: INR: 2.8 (ref 2.0–3.0)

## 2018-06-04 NOTE — Patient Instructions (Signed)
Description    Continue 1 tablet daily except 1/2 tablet each Monday, Wednesday and Friday, repeat INR in 4 weeks.

## 2018-06-27 ENCOUNTER — Ambulatory Visit: Payer: Medicare Other | Admitting: Podiatry

## 2018-06-29 ENCOUNTER — Ambulatory Visit: Payer: Medicare Other | Admitting: Podiatry

## 2018-07-13 ENCOUNTER — Telehealth: Payer: Self-pay

## 2018-07-13 NOTE — Telephone Encounter (Signed)

## 2018-07-16 ENCOUNTER — Other Ambulatory Visit: Payer: Self-pay

## 2018-07-16 ENCOUNTER — Ambulatory Visit (INDEPENDENT_AMBULATORY_CARE_PROVIDER_SITE_OTHER): Payer: Medicare Other | Admitting: Pharmacist Clinician (PhC)/ Clinical Pharmacy Specialist

## 2018-07-16 DIAGNOSIS — I4891 Unspecified atrial fibrillation: Secondary | ICD-10-CM

## 2018-07-16 DIAGNOSIS — Z7901 Long term (current) use of anticoagulants: Secondary | ICD-10-CM | POA: Diagnosis not present

## 2018-07-16 LAB — POCT INR: INR: 2.7 (ref 2.0–3.0)

## 2018-07-17 ENCOUNTER — Telehealth: Payer: Self-pay | Admitting: Pharmacist Clinician (PhC)/ Clinical Pharmacy Specialist

## 2018-07-17 NOTE — Telephone Encounter (Signed)
New Message   Pt is calling to speak with Coumadin   Please call back

## 2018-07-17 NOTE — Telephone Encounter (Signed)
Returned call.  Patient notes he is now taking doxycycline 100 mg daily x 7 days.  INR yesterday therapeutic at 2.7.  Took first dose of doxy last night.  Advised to continue without change.  He notes that he also took this antibiotic last September.  No mention in his INR appointments, but he was therapeutic in both Sept and Oct.     Patient voiced understanding, no change to warfarin dose for now.

## 2018-08-08 ENCOUNTER — Ambulatory Visit: Payer: Medicare Other | Admitting: Podiatry

## 2018-08-08 ENCOUNTER — Encounter: Payer: Self-pay | Admitting: Podiatry

## 2018-08-08 ENCOUNTER — Other Ambulatory Visit: Payer: Self-pay

## 2018-08-08 VITALS — Temp 98.1°F

## 2018-08-08 DIAGNOSIS — M79676 Pain in unspecified toe(s): Secondary | ICD-10-CM | POA: Diagnosis not present

## 2018-08-08 DIAGNOSIS — D689 Coagulation defect, unspecified: Secondary | ICD-10-CM | POA: Diagnosis not present

## 2018-08-08 DIAGNOSIS — M19072 Primary osteoarthritis, left ankle and foot: Secondary | ICD-10-CM

## 2018-08-08 DIAGNOSIS — B351 Tinea unguium: Secondary | ICD-10-CM

## 2018-08-08 NOTE — Progress Notes (Signed)
Patient ID: Gregory Werner, male   DOB: September 06, 1924, 85 y.o.   MRN: [id number] Complaint:  Visit Type: Patient returns to my office for continued preventative foot care services. Complaint: Patient states" my nails have grown long and thick and become painful to walk and wear shoes" . The patient presents for preventative foot care services. No changes to ROS.  Patient is taking coumadin.  Podiatric Exam: Vascular: dorsalis pedis and posterior tibial pulses are palpable bilateral. Capillary return is immediate. Temperature gradient is WNL. Skin turgor WNL  Sensorium: Normal Semmes Weinstein monofilament test. Normal tactile sensation bilaterally. Nail Exam: Pt has thick disfigured discolored nails with subungual debris noted bilateral entire nail hallux through fifth toenails Ulcer Exam: There is no evidence of ulcer or pre-ulcerative changes or infection. Orthopedic Exam: Muscle tone and strength are WNL. No limitations in general ROM. No crepitus or effusions noted. Foot type and digits show no abnormalities. Bony prominences over left midfoot. 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 Skin: No Porokeratosis. No infection or ulcers  Diagnosis:  Onychomycosis, , Pain in right toe, pain in left toes  Treatment & Plan Procedures and Treatment: Consent by patient was obtained for treatment procedures. The patient understood the discussion of treatment and procedures well. All questions were answered thoroughly reviewed. Debridement of mycotic and hypertrophic toenails, 1 through 5 bilateral and clearing of subungual debris. No ulceration, no infection noted.  Return Visit-Office Procedure: Patient instructed to return to the office for a follow up visit 3 months for continued evaluation and treatment.  Gardiner Barefoot DPM

## 2018-08-17 ENCOUNTER — Telehealth: Payer: Self-pay

## 2018-08-17 NOTE — Telephone Encounter (Signed)

## 2018-08-20 ENCOUNTER — Other Ambulatory Visit: Payer: Self-pay

## 2018-08-20 ENCOUNTER — Ambulatory Visit (INDEPENDENT_AMBULATORY_CARE_PROVIDER_SITE_OTHER): Payer: Medicare Other | Admitting: *Deleted

## 2018-08-20 DIAGNOSIS — Z7901 Long term (current) use of anticoagulants: Secondary | ICD-10-CM | POA: Diagnosis not present

## 2018-08-20 DIAGNOSIS — I4891 Unspecified atrial fibrillation: Secondary | ICD-10-CM | POA: Diagnosis not present

## 2018-08-20 LAB — POCT INR: INR: 1.9 — AB (ref 2.0–3.0)

## 2018-09-11 ENCOUNTER — Telehealth: Payer: Self-pay

## 2018-09-11 NOTE — Telephone Encounter (Signed)
lmom for prescreen  

## 2018-09-16 ENCOUNTER — Other Ambulatory Visit: Payer: Self-pay | Admitting: Cardiology

## 2018-09-17 ENCOUNTER — Ambulatory Visit (INDEPENDENT_AMBULATORY_CARE_PROVIDER_SITE_OTHER): Payer: Medicare Other | Admitting: Pharmacist Clinician (PhC)/ Clinical Pharmacy Specialist

## 2018-09-17 ENCOUNTER — Other Ambulatory Visit: Payer: Self-pay

## 2018-09-17 DIAGNOSIS — Z7901 Long term (current) use of anticoagulants: Secondary | ICD-10-CM

## 2018-09-17 DIAGNOSIS — I4891 Unspecified atrial fibrillation: Secondary | ICD-10-CM

## 2018-09-17 LAB — POCT INR: INR: 2.2 (ref 2.0–3.0)

## 2018-11-02 ENCOUNTER — Telehealth: Payer: Self-pay

## 2018-11-02 NOTE — Telephone Encounter (Signed)

## 2018-11-05 ENCOUNTER — Ambulatory Visit (INDEPENDENT_AMBULATORY_CARE_PROVIDER_SITE_OTHER): Payer: Medicare Other | Admitting: Pharmacist

## 2018-11-05 ENCOUNTER — Other Ambulatory Visit: Payer: Self-pay

## 2018-11-05 DIAGNOSIS — Z7901 Long term (current) use of anticoagulants: Secondary | ICD-10-CM | POA: Diagnosis not present

## 2018-11-05 DIAGNOSIS — I4891 Unspecified atrial fibrillation: Secondary | ICD-10-CM

## 2018-11-05 LAB — POCT INR: INR: 2.4 (ref 2.0–3.0)

## 2018-11-14 ENCOUNTER — Encounter: Payer: Self-pay | Admitting: Podiatry

## 2018-11-14 ENCOUNTER — Other Ambulatory Visit: Payer: Self-pay

## 2018-11-14 ENCOUNTER — Ambulatory Visit: Payer: Medicare Other | Admitting: Podiatry

## 2018-11-14 VITALS — Temp 97.2°F

## 2018-11-14 DIAGNOSIS — D689 Coagulation defect, unspecified: Secondary | ICD-10-CM

## 2018-11-14 DIAGNOSIS — M79676 Pain in unspecified toe(s): Secondary | ICD-10-CM | POA: Diagnosis not present

## 2018-11-14 DIAGNOSIS — B351 Tinea unguium: Secondary | ICD-10-CM | POA: Diagnosis not present

## 2018-11-14 NOTE — Progress Notes (Signed)
Patient ID: Gregory Werner, male   DOB: 12-01-1924, 85 y.o.   MRN: [id number] Complaint:  Visit Type: Patient returns to my office for continued preventative foot care services. Complaint: Patient states" my nails have grown long and thick and become painful to walk and wear shoes" . The patient presents for preventative foot care services. No changes to ROS.  Patient is taking coumadin.  Podiatric Exam: Vascular: dorsalis pedis and posterior tibial pulses are palpable bilateral. Capillary return is immediate. Temperature gradient is WNL. Skin turgor WNL  Sensorium: Normal Semmes Weinstein monofilament test. Normal tactile sensation bilaterally. Nail Exam: Pt has thick disfigured discolored nails with subungual debris noted bilateral entire nail hallux through fifth toenails Ulcer Exam: There is no evidence of ulcer or pre-ulcerative changes or infection. Orthopedic Exam: Muscle tone and strength are WNL. No limitations in general ROM. No crepitus or effusions noted. Foot type and digits show no abnormalities. Bony prominences over left midfoot. 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 Skin: No Porokeratosis. No infection or ulcers  Diagnosis:  Onychomycosis, , Pain in right toe, pain in left toes  Treatment & Plan Procedures and Treatment: Consent by patient was obtained for treatment procedures. The patient understood the discussion of treatment and procedures well. All questions were answered thoroughly reviewed. Debridement of mycotic and hypertrophic toenails, 1 through 5 bilateral and clearing of subungual debris. No ulceration, no infection noted.  Return Visit-Office Procedure: Patient instructed to return to the office for a follow up visit 3 months for continued evaluation and treatment.  Gardiner Barefoot DPM

## 2018-11-20 ENCOUNTER — Other Ambulatory Visit: Payer: Self-pay | Admitting: Cardiology

## 2018-11-20 NOTE — Telephone Encounter (Signed)
Please review for refill. Thank you! 

## 2018-11-20 NOTE — Telephone Encounter (Signed)
Very High Drug-Drug: warfarin and fluconazole Fluconazole may decrease hepatic metabolism and increase the hypoprothrombinemic effect of Anticoagulants. Bleeding may occur. Details warfarin (COUMADIN) 5 MG tablet [Pharmacy Med Name: WARFARIN SOD 5MG  TABLETS (PEACH)] Prescription. Reordered. Long-term. fluconazole (DIFLUCAN) 150 MG tablet Patient reported medication. Active. Discontinue High Drug-Drug: warfarin and doxycycline Hypoprothrombinemic effects of Anticoagulants may be increased by Tetracyclines. Bleeding may occur. Needs pharmd review

## 2018-11-21 NOTE — Telephone Encounter (Signed)
Please call patient and verify if patient taking fluconazole now. Need INR this week if taking fluconazole.  Okay to refill warfarin

## 2018-11-21 NOTE — Telephone Encounter (Signed)
Called the pt and they stated that they didn't need a refill and also that they are no longer taking diflucan

## 2018-12-19 ENCOUNTER — Other Ambulatory Visit: Payer: Self-pay

## 2018-12-19 ENCOUNTER — Ambulatory Visit (INDEPENDENT_AMBULATORY_CARE_PROVIDER_SITE_OTHER): Payer: Medicare Other | Admitting: Pharmacist

## 2018-12-19 DIAGNOSIS — I4891 Unspecified atrial fibrillation: Secondary | ICD-10-CM | POA: Diagnosis not present

## 2018-12-19 DIAGNOSIS — Z7901 Long term (current) use of anticoagulants: Secondary | ICD-10-CM | POA: Diagnosis not present

## 2018-12-19 LAB — POCT INR: INR: 2.1 (ref 2.0–3.0)

## 2019-01-07 ENCOUNTER — Other Ambulatory Visit: Payer: Self-pay | Admitting: Cardiology

## 2019-01-07 MED ORDER — WARFARIN SODIUM 5 MG PO TABS: ORAL_TABLET | ORAL | 1 refills | Status: AC

## 2019-01-07 NOTE — Telephone Encounter (Signed)
New Message   *STAT* If patient is at the pharmacy, call can be transferred to refill team.   1. Which medications need to be refilled? (please list name of each medication and dose if known) warfarin (COUMADIN) 5 MG tablet  2. Which pharmacy/location (including street and city if local pharmacy) is medication to be sent to? [id number] - [place], [place] [address]  3. Do they need a 30 day or 90 day supply? 90 day

## 2019-01-30 ENCOUNTER — Other Ambulatory Visit: Payer: Self-pay

## 2019-01-30 ENCOUNTER — Ambulatory Visit (INDEPENDENT_AMBULATORY_CARE_PROVIDER_SITE_OTHER): Payer: Medicare Other | Admitting: Pharmacist

## 2019-01-30 DIAGNOSIS — I4891 Unspecified atrial fibrillation: Secondary | ICD-10-CM

## 2019-01-30 DIAGNOSIS — Z7901 Long term (current) use of anticoagulants: Secondary | ICD-10-CM | POA: Diagnosis not present

## 2019-01-30 LAB — POCT INR: INR: 2.7 (ref 2.0–3.0)

## 2019-02-20 ENCOUNTER — Ambulatory Visit: Payer: Medicare Other | Admitting: Podiatry

## 2019-03-06 ENCOUNTER — Other Ambulatory Visit: Payer: Self-pay

## 2019-03-06 ENCOUNTER — Encounter: Payer: Self-pay | Admitting: Podiatry

## 2019-03-06 ENCOUNTER — Ambulatory Visit: Payer: Medicare Other | Admitting: Podiatry

## 2019-03-06 DIAGNOSIS — M79676 Pain in unspecified toe(s): Secondary | ICD-10-CM

## 2019-03-06 DIAGNOSIS — D689 Coagulation defect, unspecified: Secondary | ICD-10-CM | POA: Diagnosis not present

## 2019-03-06 DIAGNOSIS — B351 Tinea unguium: Secondary | ICD-10-CM

## 2019-03-06 NOTE — Progress Notes (Signed)
Patient ID: Gregory Werner, male   DOB: Sep 30, 1924, 85 y.o.   MRN: [id number] Complaint:  Visit Type: Patient returns to my office for continued preventative foot care services. Complaint: Patient states" my nails have grown long and thick and become painful to walk and wear shoes" . The patient presents for preventative foot care services. No changes to ROS.  Patient is taking coumadin.  Podiatric Exam: Vascular: dorsalis pedis and posterior tibial pulses are palpable bilateral. Capillary return is immediate. Temperature gradient is WNL. Skin turgor WNL  Sensorium: Normal Semmes Weinstein monofilament test. Normal tactile sensation bilaterally. Nail Exam: Pt has thick disfigured discolored nails with subungual debris noted bilateral entire nail hallux through fifth toenails Ulcer Exam: There is no evidence of ulcer or pre-ulcerative changes or infection. Orthopedic Exam: Muscle tone and strength are WNL. No limitations in general ROM. No crepitus or effusions noted. Foot type and digits show no abnormalities. Bony prominences over left midfoot. 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 Skin: No Porokeratosis. No infection or ulcers  Diagnosis:  Onychomycosis, , Pain in right toe, pain in left toes  Treatment & Plan Procedures and Treatment: Consent by patient was obtained for treatment procedures. The patient understood the discussion of treatment and procedures well. All questions were answered thoroughly reviewed. Debridement of mycotic and hypertrophic toenails, 1 through 5 bilateral and clearing of subungual debris. No ulceration, no infection noted.  Return Visit-Office Procedure: Patient instructed to return to the office for a follow up visit 3 months for continued evaluation and treatment.  Gardiner Barefoot DPM

## 2019-03-13 ENCOUNTER — Other Ambulatory Visit: Payer: Self-pay

## 2019-03-13 ENCOUNTER — Ambulatory Visit (INDEPENDENT_AMBULATORY_CARE_PROVIDER_SITE_OTHER): Payer: Medicare Other | Admitting: Pharmacist

## 2019-03-13 DIAGNOSIS — Z7901 Long term (current) use of anticoagulants: Secondary | ICD-10-CM

## 2019-03-13 DIAGNOSIS — I4891 Unspecified atrial fibrillation: Secondary | ICD-10-CM | POA: Diagnosis not present

## 2019-03-13 LAB — POCT INR: INR: 2.5 (ref 2.0–3.0)

## 2019-03-20 ENCOUNTER — Emergency Department (HOSPITAL_COMMUNITY): Payer: Medicare Other

## 2019-03-20 ENCOUNTER — Other Ambulatory Visit: Payer: Self-pay | Admitting: Internal Medicine

## 2019-03-20 ENCOUNTER — Ambulatory Visit
Admission: RE | Admit: 2019-03-20 | Discharge: 2019-03-20 | Disposition: A | Discharge status: Home / Self Care | Payer: Medicare Other | Source: Ambulatory Visit | Attending: Internal Medicine | Admitting: Internal Medicine

## 2019-03-20 ENCOUNTER — Inpatient Hospital Stay (HOSPITAL_COMMUNITY)
Admission: EM | Admit: 2019-03-20 | Discharge: 2019-03-28 | DRG: 287 | Disposition: A | Discharge status: Hospice | Payer: Medicare Other | Attending: Internal Medicine | Admitting: Internal Medicine

## 2019-03-20 DIAGNOSIS — R0602 Shortness of breath: Secondary | ICD-10-CM | POA: Diagnosis not present

## 2019-03-20 DIAGNOSIS — Z881 Allergy status to other antibiotic agents status: Secondary | ICD-10-CM

## 2019-03-20 DIAGNOSIS — Z974 Presence of external hearing-aid: Secondary | ICD-10-CM

## 2019-03-20 DIAGNOSIS — Z87891 Personal history of nicotine dependence: Secondary | ICD-10-CM

## 2019-03-20 DIAGNOSIS — I340 Nonrheumatic mitral (valve) insufficiency: Secondary | ICD-10-CM | POA: Diagnosis present

## 2019-03-20 DIAGNOSIS — Z20828 Contact with and (suspected) exposure to other viral communicable diseases: Secondary | ICD-10-CM | POA: Diagnosis present

## 2019-03-20 DIAGNOSIS — I255 Ischemic cardiomyopathy: Secondary | ICD-10-CM | POA: Diagnosis present

## 2019-03-20 DIAGNOSIS — I2510 Atherosclerotic heart disease of native coronary artery without angina pectoris: Secondary | ICD-10-CM | POA: Diagnosis present

## 2019-03-20 DIAGNOSIS — M6281 Muscle weakness (generalized): Secondary | ICD-10-CM

## 2019-03-20 DIAGNOSIS — E876 Hypokalemia: Secondary | ICD-10-CM | POA: Diagnosis present

## 2019-03-20 DIAGNOSIS — R7989 Other specified abnormal findings of blood chemistry: Secondary | ICD-10-CM

## 2019-03-20 DIAGNOSIS — D72829 Elevated white blood cell count, unspecified: Secondary | ICD-10-CM | POA: Diagnosis present

## 2019-03-20 DIAGNOSIS — I110 Hypertensive heart disease with heart failure: Principal | ICD-10-CM | POA: Diagnosis present

## 2019-03-20 DIAGNOSIS — N179 Acute kidney failure, unspecified: Secondary | ICD-10-CM | POA: Diagnosis not present

## 2019-03-20 DIAGNOSIS — E785 Hyperlipidemia, unspecified: Secondary | ICD-10-CM | POA: Diagnosis present

## 2019-03-20 DIAGNOSIS — K219 Gastro-esophageal reflux disease without esophagitis: Secondary | ICD-10-CM | POA: Diagnosis present

## 2019-03-20 DIAGNOSIS — Z7901 Long term (current) use of anticoagulants: Secondary | ICD-10-CM

## 2019-03-20 DIAGNOSIS — I5021 Acute systolic (congestive) heart failure: Secondary | ICD-10-CM

## 2019-03-20 DIAGNOSIS — I509 Heart failure, unspecified: Secondary | ICD-10-CM

## 2019-03-20 DIAGNOSIS — K7689 Other specified diseases of liver: Secondary | ICD-10-CM | POA: Diagnosis present

## 2019-03-20 DIAGNOSIS — Z66 Do not resuscitate: Secondary | ICD-10-CM | POA: Diagnosis present

## 2019-03-20 DIAGNOSIS — Z8249 Family history of ischemic heart disease and other diseases of the circulatory system: Secondary | ICD-10-CM

## 2019-03-20 DIAGNOSIS — R319 Hematuria, unspecified: Secondary | ICD-10-CM | POA: Diagnosis not present

## 2019-03-20 DIAGNOSIS — I4821 Permanent atrial fibrillation: Secondary | ICD-10-CM | POA: Diagnosis present

## 2019-03-20 DIAGNOSIS — Z96653 Presence of artificial knee joint, bilateral: Secondary | ICD-10-CM | POA: Diagnosis present

## 2019-03-20 DIAGNOSIS — R791 Abnormal coagulation profile: Secondary | ICD-10-CM | POA: Diagnosis present

## 2019-03-20 DIAGNOSIS — I4891 Unspecified atrial fibrillation: Secondary | ICD-10-CM | POA: Diagnosis present

## 2019-03-20 DIAGNOSIS — I5041 Acute combined systolic (congestive) and diastolic (congestive) heart failure: Secondary | ICD-10-CM | POA: Diagnosis present

## 2019-03-20 DIAGNOSIS — Z923 Personal history of irradiation: Secondary | ICD-10-CM

## 2019-03-20 DIAGNOSIS — Z79899 Other long term (current) drug therapy: Secondary | ICD-10-CM

## 2019-03-20 DIAGNOSIS — I447 Left bundle-branch block, unspecified: Secondary | ICD-10-CM | POA: Diagnosis present

## 2019-03-20 DIAGNOSIS — I10 Essential (primary) hypertension: Secondary | ICD-10-CM | POA: Diagnosis present

## 2019-03-20 DIAGNOSIS — E875 Hyperkalemia: Secondary | ICD-10-CM | POA: Diagnosis not present

## 2019-03-20 DIAGNOSIS — H9190 Unspecified hearing loss, unspecified ear: Secondary | ICD-10-CM | POA: Diagnosis present

## 2019-03-20 DIAGNOSIS — Z8546 Personal history of malignant neoplasm of prostate: Secondary | ICD-10-CM

## 2019-03-20 LAB — COMPREHENSIVE METABOLIC PANEL
ALT: 104 U/L — ABNORMAL HIGH (ref 0–44)
AST: 88 U/L — ABNORMAL HIGH (ref 15–41)
Albumin: 3.4 g/dL — ABNORMAL LOW (ref 3.5–5.0)
Alkaline Phosphatase: 135 U/L — ABNORMAL HIGH (ref 38–126)
Anion gap: 12 (ref 5–15)
BUN: 45 mg/dL — ABNORMAL HIGH (ref 8–23)
CO2: 20 mmol/L — ABNORMAL LOW (ref 22–32)
Calcium: 9.3 mg/dL (ref 8.9–10.3)
Chloride: 103 mmol/L (ref 98–111)
Creatinine, Ser: 1.14 mg/dL (ref 0.61–1.24)
GFR calc Af Amer: >60 mL/min (ref >60)
GFR calc non Af Amer: 56 mL/min — ABNORMAL LOW (ref >60)
Glucose, Bld: 137 mg/dL — ABNORMAL HIGH (ref 70–99)
Potassium: 5.2 mmol/L — ABNORMAL HIGH (ref 3.5–5.1)
Sodium: 135 mmol/L (ref 135–145)
Total Bilirubin: 0.9 mg/dL (ref 0.3–1.2)
Total Protein: 6.4 g/dL — ABNORMAL LOW (ref 6.5–8.1)

## 2019-03-20 LAB — URINALYSIS, ROUTINE W REFLEX MICROSCOPIC
Bacteria, UA: NONE SEEN
Bilirubin Urine: NEGATIVE
Glucose, UA: NEGATIVE mg/dL
Hgb urine dipstick: NEGATIVE
Ketones, ur: 5 mg/dL — AB
Leukocytes,Ua: NEGATIVE
Nitrite: NEGATIVE
Protein, ur: 100 mg/dL — AB
Specific Gravity, Urine: 1.03 (ref 1.005–1.030)
pH: 5 (ref 5.0–8.0)

## 2019-03-20 LAB — CBC WITH DIFFERENTIAL/PLATELET
Abs Immature Granulocytes: 0.08 10*3/uL — ABNORMAL HIGH (ref 0.00–0.07)
Basophils Absolute: 0 10*3/uL (ref 0.0–0.1)
Basophils Relative: 0 %
Eosinophils Absolute: 0 10*3/uL (ref 0.0–0.5)
Eosinophils Relative: 0 %
HCT: 41.8 % (ref 39.0–52.0)
Hemoglobin: 14 g/dL (ref 13.0–17.0)
Immature Granulocytes: 1 %
Lymphocytes Relative: 9 %
Lymphs Abs: 1.1 10*3/uL (ref 0.7–4.0)
MCH: 32.2 pg (ref 26.0–34.0)
MCHC: 33.5 g/dL (ref 30.0–36.0)
MCV: 96.1 fL (ref 80.0–100.0)
Monocytes Absolute: 1 10*3/uL (ref 0.1–1.0)
Monocytes Relative: 9 %
Neutro Abs: 9.3 10*3/uL — ABNORMAL HIGH (ref 1.7–7.7)
Neutrophils Relative %: 81 %
Platelets: 280 10*3/uL (ref 150–400)
RBC: 4.35 MIL/uL (ref 4.22–5.81)
RDW: 13.1 % (ref 11.5–15.5)
WBC: 11.5 10*3/uL — ABNORMAL HIGH (ref 4.0–10.5)
nRBC: 0 % (ref 0.0–0.2)

## 2019-03-20 LAB — BRAIN NATRIURETIC PEPTIDE: B Natriuretic Peptide: 3183.6 pg/mL — ABNORMAL HIGH (ref 0.0–100.0)

## 2019-03-20 MED ORDER — FUROSEMIDE 10 MG/ML IJ SOLN
20.0000 mg | Freq: Once | INTRAMUSCULAR | Status: AC
  Administered 2019-03-21: 20 mg via INTRAVENOUS
  Filled 2019-03-20: qty 2

## 2019-03-20 NOTE — ED Provider Notes (Signed)
[facility] EMERGENCY DEPARTMENT Provider Note   CSN: [id number] Arrival date & time: 03/20/19  2045     History Chief Complaint  Patient presents with  . Shortness of Breath    Gregory Werner is a 85 y.o. male.  HPI     Patient presents at the behest of his primary care physician due to EKG changes. Patient himself is a reluctant historian, denies pain, denies dyspnea, denies fever, denies cough. However, patient reportedly has had a cough, increased work of breathing for 2 or 3 days, and today spoke with his physician. Reported the patient had an EKG that was different from his most recent study, and after his primary care physician discussed this with his cardiologist he was sent here for evaluation. Patient notes that he had a Covid test earlier in the day that was negative.   Past Medical History:  Diagnosis Date  . Arthritis   . Atrial fibrillation ([place])   . Diverticulosis    history  . Dysphagia 2001   history s/p esophageal dilatation  . Dysrhythmia    hx. Atrial Fibrillation, RBBB  . GERD (gastroesophageal reflux disease)   . Hearing loss    bilateral hearing aids  . Hyperlipidemia   . Hypertension    benign  . Lung infiltrate 05/2010   left lower lobe infiltrate, questionable pneumonia  . Prostate cancer [facility]) 2001   treated with radiation    Patient Active Problem List   Diagnosis Date Noted  . Non-rheumatic mitral regurgitation 05/08/2017  . Hyponatremia 08/20/2013  . OA (osteoarthritis) of knee 08/19/2013  . Long term (current) use of anticoagulants 06/29/2010  . HYPERLIPIDEMIA 05/29/2008  . HYPERTENSION, BENIGN 05/29/2008  . GERD 05/29/2008  . Atrial fibrillation ([place]) 05/10/2008    Past Surgical History:  Procedure Laterality Date  . CATARACT EXTRACTION  2008  . ESOPHAGEAL DILATION  2001  . EYE SURGERY    . JOINT REPLACEMENT     LTKA  . PROSTATE SURGERY    . REPLACEMENT TOTAL KNEE  2012   left  . TONSILLECTOMY      . TOTAL KNEE ARTHROPLASTY Right 08/19/2013   Procedure: RIGHT TOTAL KNEE ARTHROPLASTY;  Surgeon: Gearlean Alf, MD;  Location: [facility] ORS;  Service: Orthopedics;  Laterality: Right;  Marland Kitchen VASECTOMY         Family History  Problem Relation Age of Onset  . Heart attack Mother   . Heart attack Father   . Stroke Father     Social History   Tobacco Use  . Smoking status: Former Research scientist (life sciences)  . Smokeless tobacco: Never Used  . Tobacco comment: smoked only 7 years and not everyday  Substance Use Topics  . Alcohol use: Yes    Alcohol/week: 1.0 standard drinks    Types: 1 Cans of beer per week    Comment: beer nightly 1  . Drug use: No    Home Medications Prior to Admission medications   Medication Sig Start Date End Date Taking? Authorizing Provider  acetaminophen (TYLENOL) 500 MG tablet Take 500 mg by mouth every 6 (six) hours as needed (Pain).    [provider]  Bilberry, Vaccinium myrtillus, (BILBERRY EXTRACT PO) Take by mouth daily.    [provider]  calcium carbonate 200 MG capsule Take 800 mg by mouth daily.    [provider]  cholecalciferol (VITAMIN D) 1000 UNITS tablet Take 1,000 Units by mouth daily.    [provider]  Cyanocobalamin (VITAMIN B  12 PO) Take 1,000 mg by mouth daily.    [provider]  famotidine (PEPCID AC) 10 MG chewable tablet Chew 10 mg by mouth at bedtime.    [provider]  glucosamine-chondroitin 500-400 MG tablet Take 1 tablet by mouth 3 (three) times daily.    [provider]  Multiple Vitamins-Minerals (PRESERVISION AREDS) TABS Take 1 tablet by mouth daily.    [provider]  MYRBETRIQ 50 MG TB24 Take 1 tablet by mouth daily. Takes at [facility] 07/19/12   [provider]  Omega-3 Fatty Acids (FISH OIL) 1200 MG CAPS Take 1 capsule by mouth daily.    [provider]  ramipril (ALTACE) 5 MG capsule TAKE 1 CAPSULE(5 MG) BY MOUTH TWICE DAILY 09/17/18   Minus Breeding, MD   terazosin (HYTRIN) 2 MG capsule Take 2 mg by mouth at bedtime.      [provider]  tobramycin (TOBREX) 0.3 % ophthalmic solution  02/11/19   [provider]  vitamin C (ASCORBIC ACID) 500 MG tablet Take 500 mg by mouth daily.    [provider]  warfarin (COUMADIN) 5 MG tablet TAKE 1/2 TO 1 TABLET BY MOUTH DAILY AS DIRECTED BY COUMADIN CLINIC 01/07/19   Minus Breeding, MD    Allergies    Cephalexin  Review of Systems   Review of Systems  Physical Exam Updated Vital Signs BP (!) 168/95   Pulse 68   Temp 97.9 F (36.6 C) (Oral)   Resp 16   Ht 5\' 10"  (1.778 m)   Wt 72.8 kg   SpO2 97%   BMI 23.03 kg/m   Physical Exam Vitals and nursing note reviewed.  Constitutional:      Appearance: He is ill-appearing.  HENT:     Head: Normocephalic and atraumatic.  Eyes:     Conjunctiva/sclera: Conjunctivae normal.  Cardiovascular:     Rate and Rhythm: Tachycardia present. Rhythm irregular.  Pulmonary:     Effort: Pulmonary effort is normal. Tachypnea present.     Breath sounds: No decreased breath sounds or wheezing.  Abdominal:     General: There is no distension.  Musculoskeletal:     Right lower leg: No edema.     Left lower leg: No edema.  Skin:    General: Skin is warm and dry.  Neurological:     Mental Status: He is alert and oriented to person, place, and time.     ED Results / Procedures / Treatments   Labs (all labs ordered are listed, but only abnormal results are displayed) Labs Reviewed  COMPREHENSIVE METABOLIC PANEL - Abnormal; Notable for the following components:      Result Value   Potassium 5.2 (*)    CO2 20 (*)    Glucose, Bld 137 (*)    BUN 45 (*)    Total Protein 6.4 (*)    Albumin 3.4 (*)    AST 88 (*)    ALT 104 (*)    Alkaline Phosphatase 135 (*)    GFR calc non Af Amer 56 (*)    All other components within normal limits  CBC WITH DIFFERENTIAL/PLATELET - Abnormal; Notable for the following components:   WBC 11.5  (*)    Neutro Abs 9.3 (*)    Abs Immature Granulocytes 0.08 (*)    All other components within normal limits  BRAIN NATRIURETIC PEPTIDE - Abnormal; Notable for the following components:   B Natriuretic Peptide 3,183.6 (*)    All other components within  normal limits  URINALYSIS, ROUTINE W REFLEX MICROSCOPIC - Abnormal; Notable for the following components:   Ketones, ur 5 (*)    Protein, ur 100 (*)    All other components within normal limits  PROTIME-INR  POC SARS CORONAVIRUS 2 AG -  ED    EKG EKG Interpretation  Date/Time:  Wednesday March 20 2019 20:48:59 EST Ventricular Rate:  74 PR Interval:    QRS Duration: 151 QT Interval:  420 QTC Calculation: 466 R Axis:   -83 Text Interpretation: Atrial fibrillation Left bundle branch block , new since last tracing Abnormal ECG Confirmed by Carmin Muskrat [phone]) on 03/20/2019 9:00:09 PM   Radiology DG Chest 2 View  Result Date: 03/20/2019 CLINICAL DATA:  Weakness over the last 2 days. EXAM: CHEST - 2 VIEW COMPARISON:  08/09/2013 FINDINGS: Cardiomegaly. Aortic atherosclerosis. Abnormal interstitial lung markings, most consistent with fluid overload/congestive heart failure. Cannot rule out the possibility of viral pneumonia. No dense consolidation or lobar collapse. Minimal blunting of the costophrenic angles but no large effusion. IMPRESSION: Cardiomegaly. Probable mild pulmonary edema/congestive heart failure. Cannot rule out the possibility of viral pneumonia. Electronically Signed   By: Nelson Chimes M.D.   On: 03/20/2019 13:54   DG Chest Port 1 View  Result Date: 03/20/2019 CLINICAL DATA:  Shortness of breath EXAM: PORTABLE CHEST 1 VIEW COMPARISON:  03/20/2019 FINDINGS: Cardiomegaly, vascular congestion. Mild interstitial prominence could reflect interstitial edema. No effusions or acute bony abnormality. IMPRESSION: Cardiomegaly with vascular congestion and possible mild interstitial edema. Findings similar to prior study.  Electronically Signed   By: Rolm Baptise M.D.   On: 03/20/2019 21:32    Procedures Procedures (including critical care time)  Medications Ordered in ED Medications  furosemide (LASIX) injection 20 mg (has no administration in time range)    ED Course  I have reviewed the triage vital signs and the nursing notes.  Pertinent labs & imaging results that were available during my care of the patient were reviewed by me and considered in my medical decision making (see chart for details).    MDM Rules/Calculators/A&P                     Chart review notable for last echocardiogram 4 years ago: Study Conclusions   - Left ventricle: The cavity size was normal. There was mild focal   basal hypertrophy of the septum. Systolic function was normal.   The estimated ejection fraction was in the range of 60% to 65%.   Wall motion was normal; there were no regional wall motion   abnormalities. - Aortic valve: There was mild regurgitation directed eccentrically   in the LVOT and towards the mitral anterior leaflet. - Aorta: Aortic root dimension: 43 mm (ED). Sinus of Valsalva   Ascending aortic diameter: 41 mm (S). - Ascending aorta: The ascending aorta was mildly dilated. - Mitral valve: There was moderate regurgitation. - Left atrium: The atrium was severely dilated. Volume/bsa, ES,   (1-plane Simpson&'s, A2C): 53.9 ml/m^2. - Right atrium: The atrium was severely dilated. - Tricuspid valve: There was moderate regurgitation. - Pulmonary arteries: Systolic pressure was moderately to severely   increased. PA peak pressure: 66 mm Hg (S).    12:05 AM On repeat exam the patient remains tachypneic.  Line is not hypoxic, but with increased work of breathing, tachypnea, concern for heart failure, which he does not have a history of, the patient will require admission for consideration of a.m. echocardiogram. Patient has no current  chest pain, is otherwise hemodynamically unremarkable.    covid  pending on admission  Final Clinical Impression(s) / ED Diagnoses Final diagnoses:  Shortness of breath      Carmin Muskrat, MD 03/21/19 0006

## 2019-03-20 NOTE — ED Triage Notes (Signed)
BIB EMS from home, c/o shortness of breath x 4 days. Saw PCP earlier today and was advised to come to ER for EKG changes. Rapid Covid test was done at PCP with negative result.

## 2019-03-21 ENCOUNTER — Inpatient Hospital Stay (HOSPITAL_COMMUNITY): Payer: Medicare Other

## 2019-03-21 DIAGNOSIS — I509 Heart failure, unspecified: Secondary | ICD-10-CM

## 2019-03-21 DIAGNOSIS — R7989 Other specified abnormal findings of blood chemistry: Secondary | ICD-10-CM | POA: Diagnosis not present

## 2019-03-21 DIAGNOSIS — I5041 Acute combined systolic (congestive) and diastolic (congestive) heart failure: Secondary | ICD-10-CM | POA: Diagnosis present

## 2019-03-21 DIAGNOSIS — H9190 Unspecified hearing loss, unspecified ear: Secondary | ICD-10-CM | POA: Diagnosis present

## 2019-03-21 DIAGNOSIS — I447 Left bundle-branch block, unspecified: Secondary | ICD-10-CM | POA: Diagnosis present

## 2019-03-21 DIAGNOSIS — E876 Hypokalemia: Secondary | ICD-10-CM | POA: Diagnosis present

## 2019-03-21 DIAGNOSIS — R0602 Shortness of breath: Secondary | ICD-10-CM | POA: Diagnosis present

## 2019-03-21 DIAGNOSIS — I2510 Atherosclerotic heart disease of native coronary artery without angina pectoris: Secondary | ICD-10-CM | POA: Diagnosis present

## 2019-03-21 DIAGNOSIS — I361 Nonrheumatic tricuspid (valve) insufficiency: Secondary | ICD-10-CM | POA: Diagnosis not present

## 2019-03-21 DIAGNOSIS — N179 Acute kidney failure, unspecified: Secondary | ICD-10-CM | POA: Diagnosis not present

## 2019-03-21 DIAGNOSIS — E785 Hyperlipidemia, unspecified: Secondary | ICD-10-CM | POA: Diagnosis present

## 2019-03-21 DIAGNOSIS — D72829 Elevated white blood cell count, unspecified: Secondary | ICD-10-CM | POA: Diagnosis present

## 2019-03-21 DIAGNOSIS — Z974 Presence of external hearing-aid: Secondary | ICD-10-CM | POA: Diagnosis not present

## 2019-03-21 DIAGNOSIS — I4819 Other persistent atrial fibrillation: Secondary | ICD-10-CM | POA: Diagnosis not present

## 2019-03-21 DIAGNOSIS — Z8546 Personal history of malignant neoplasm of prostate: Secondary | ICD-10-CM | POA: Diagnosis not present

## 2019-03-21 DIAGNOSIS — I10 Essential (primary) hypertension: Secondary | ICD-10-CM | POA: Diagnosis not present

## 2019-03-21 DIAGNOSIS — Z96653 Presence of artificial knee joint, bilateral: Secondary | ICD-10-CM | POA: Diagnosis present

## 2019-03-21 DIAGNOSIS — R319 Hematuria, unspecified: Secondary | ICD-10-CM | POA: Diagnosis not present

## 2019-03-21 DIAGNOSIS — Z923 Personal history of irradiation: Secondary | ICD-10-CM | POA: Diagnosis not present

## 2019-03-21 DIAGNOSIS — I351 Nonrheumatic aortic (valve) insufficiency: Secondary | ICD-10-CM | POA: Diagnosis not present

## 2019-03-21 DIAGNOSIS — Z66 Do not resuscitate: Secondary | ICD-10-CM | POA: Diagnosis present

## 2019-03-21 DIAGNOSIS — I5021 Acute systolic (congestive) heart failure: Secondary | ICD-10-CM | POA: Diagnosis not present

## 2019-03-21 DIAGNOSIS — R791 Abnormal coagulation profile: Secondary | ICD-10-CM | POA: Diagnosis present

## 2019-03-21 DIAGNOSIS — I340 Nonrheumatic mitral (valve) insufficiency: Secondary | ICD-10-CM | POA: Diagnosis present

## 2019-03-21 DIAGNOSIS — I255 Ischemic cardiomyopathy: Secondary | ICD-10-CM | POA: Diagnosis present

## 2019-03-21 DIAGNOSIS — K7689 Other specified diseases of liver: Secondary | ICD-10-CM | POA: Diagnosis present

## 2019-03-21 DIAGNOSIS — E875 Hyperkalemia: Secondary | ICD-10-CM | POA: Diagnosis not present

## 2019-03-21 DIAGNOSIS — I4821 Permanent atrial fibrillation: Secondary | ICD-10-CM | POA: Diagnosis present

## 2019-03-21 DIAGNOSIS — I110 Hypertensive heart disease with heart failure: Secondary | ICD-10-CM | POA: Diagnosis present

## 2019-03-21 DIAGNOSIS — K219 Gastro-esophageal reflux disease without esophagitis: Secondary | ICD-10-CM | POA: Diagnosis present

## 2019-03-21 DIAGNOSIS — Z8249 Family history of ischemic heart disease and other diseases of the circulatory system: Secondary | ICD-10-CM | POA: Diagnosis not present

## 2019-03-21 DIAGNOSIS — Z20828 Contact with and (suspected) exposure to other viral communicable diseases: Secondary | ICD-10-CM | POA: Diagnosis present

## 2019-03-21 LAB — BASIC METABOLIC PANEL
Anion gap: 13 (ref 5–15)
BUN: 36 mg/dL — ABNORMAL HIGH (ref 8–23)
CO2: 25 mmol/L (ref 22–32)
Calcium: 8.7 mg/dL — ABNORMAL LOW (ref 8.9–10.3)
Chloride: 98 mmol/L (ref 98–111)
Creatinine, Ser: 1.14 mg/dL (ref 0.61–1.24)
GFR calc Af Amer: >60 mL/min (ref >60)
GFR calc non Af Amer: 56 mL/min — ABNORMAL LOW (ref >60)
Glucose, Bld: 164 mg/dL — ABNORMAL HIGH (ref 70–99)
Potassium: 4.1 mmol/L (ref 3.5–5.1)
Sodium: 136 mmol/L (ref 135–145)

## 2019-03-21 LAB — CBC
HCT: 40.9 % (ref 39.0–52.0)
Hemoglobin: 13.3 g/dL (ref 13.0–17.0)
MCH: 31.1 pg (ref 26.0–34.0)
MCHC: 32.5 g/dL (ref 30.0–36.0)
MCV: 95.8 fL (ref 80.0–100.0)
Platelets: 281 10*3/uL (ref 150–400)
RBC: 4.27 MIL/uL (ref 4.22–5.81)
RDW: 13.3 % (ref 11.5–15.5)
WBC: 11.1 10*3/uL — ABNORMAL HIGH (ref 4.0–10.5)
nRBC: 0.2 % (ref 0.0–0.2)

## 2019-03-21 LAB — ECHOCARDIOGRAM COMPLETE
Height: 70 in
Weight: 2567.92 oz

## 2019-03-21 LAB — HEPATIC FUNCTION PANEL
ALT: 99 U/L — ABNORMAL HIGH (ref 0–44)
AST: 78 U/L — ABNORMAL HIGH (ref 15–41)
Albumin: 3.1 g/dL — ABNORMAL LOW (ref 3.5–5.0)
Alkaline Phosphatase: 127 U/L — ABNORMAL HIGH (ref 38–126)
Bilirubin, Direct: 0.4 mg/dL — ABNORMAL HIGH (ref 0.0–0.2)
Indirect Bilirubin: 1 mg/dL — ABNORMAL HIGH (ref 0.3–0.9)
Total Bilirubin: 1.4 mg/dL — ABNORMAL HIGH (ref 0.3–1.2)
Total Protein: 5.8 g/dL — ABNORMAL LOW (ref 6.5–8.1)

## 2019-03-21 LAB — SARS CORONAVIRUS 2 (TAT 6-24 HRS): SARS Coronavirus 2: NEGATIVE

## 2019-03-21 LAB — PROTIME-INR
INR: 3.8 — ABNORMAL HIGH (ref 0.8–1.2)
INR: 3.9 — ABNORMAL HIGH (ref 0.8–1.2)
Prothrombin Time: 37.4 seconds — ABNORMAL HIGH (ref 11.4–15.2)
Prothrombin Time: 38.2 seconds — ABNORMAL HIGH (ref 11.4–15.2)

## 2019-03-21 LAB — POC SARS CORONAVIRUS 2 AG -  ED: SARS Coronavirus 2 Ag: NEGATIVE

## 2019-03-21 LAB — TROPONIN I (HIGH SENSITIVITY)
Troponin I (High Sensitivity): 103 ng/L (ref <18)
Troponin I (High Sensitivity): 100 ng/L (ref <18)

## 2019-03-21 MED ORDER — FUROSEMIDE 10 MG/ML IJ SOLN
20.0000 mg | Freq: Two times a day (BID) | INTRAMUSCULAR | Status: DC
  Administered 2019-03-21 – 2019-03-23 (×5): 20 mg via INTRAVENOUS
  Filled 2019-03-21 (×5): qty 2

## 2019-03-21 MED ORDER — SODIUM CHLORIDE 0.9% FLUSH
3.0000 mL | Freq: Two times a day (BID) | INTRAVENOUS | Status: DC
  Administered 2019-03-21 – 2019-03-23 (×5): 3 mL via INTRAVENOUS

## 2019-03-21 MED ORDER — SODIUM CHLORIDE 0.9% FLUSH: 3.0000 mL | INTRAVENOUS | Status: DC | PRN

## 2019-03-21 MED ORDER — WARFARIN - PHARMACIST DOSING INPATIENT: Freq: Every day | Status: DC

## 2019-03-21 MED ORDER — CARVEDILOL 12.5 MG PO TABS: 6.2500 mg | ORAL_TABLET | Freq: Two times a day (BID) | ORAL | Status: DC

## 2019-03-21 MED ORDER — ACETAMINOPHEN 325 MG PO TABS: 650.0000 mg | ORAL_TABLET | ORAL | Status: DC | PRN

## 2019-03-21 MED ORDER — SODIUM CHLORIDE 0.9 % IV SOLN: 250.0000 mL | INTRAVENOUS | Status: DC | PRN

## 2019-03-21 NOTE — ED Notes (Signed)
Pt tired of waiting to get a room upstairs

## 2019-03-21 NOTE — ED Notes (Addendum)
HH breakfast tray ordered

## 2019-03-21 NOTE — H&P (Signed)
History and Physical    SONIA STICKELS EKC:[id number] DOB: [birth year] DOA: 03/20/2019  PCP: [name], DO Patient coming from: Home  Chief Complaint: Abnormal EKG  HPI: Gregory Werner is a 85 y.o. male with medical history significant of A. fib, hypertension, hyperlipidemia, prostate cancer treated with radiation being sent to the ED by his PCP for evaluation of an abnormal EKG. Patient states he saw his PCP yesterday and was advised to come into the ED as his EKG appeared abnormal.  He has been experiencing shortness of breath, orthopnea, and a cough for the past few days.  Denies chest pain.  Denies recent weight gain or lower extremity edema.  He has no other complaints.  ED Course: Blood pressure slightly elevated.  Afebrile.  WBC count 11.5.  Potassium 5.2.  Bicarb 20, anion gap 12.  Blood glucose 137.  LFTs elevated (AST 88, ALT 104, alk phos 135).  T bili normal.  No recent labs for comparison.  UA not suggestive of infection.  BNP 3183.  INR 3.8.  SARS-CoV-2 rapid antigen test negative.  Chest x-ray showing cardiomegaly with vascular congestion and possible mild interstitial edema. Patient received IV Lasix 20 mg.  Review of Systems:  All systems reviewed and apart from history of presenting illness, are negative.  Past Medical History:  Diagnosis Date  . Arthritis   . Atrial fibrillation ([place])   . Diverticulosis    history  . Dysphagia 2001   history s/p esophageal dilatation  . Dysrhythmia    hx. Atrial Fibrillation, RBBB  . GERD (gastroesophageal reflux disease)   . Hearing loss    bilateral hearing aids  . Hyperlipidemia   . Hypertension    benign  . Lung infiltrate 05/2010   left lower lobe infiltrate, questionable pneumonia  . Prostate cancer [facility]) 2001   treated with radiation    Past Surgical History:  Procedure Laterality Date  . CATARACT EXTRACTION  2008  . ESOPHAGEAL DILATION  2001  . EYE SURGERY    . JOINT REPLACEMENT     LTKA  . PROSTATE SURGERY      . REPLACEMENT TOTAL KNEE  2012   left  . TONSILLECTOMY    . TOTAL KNEE ARTHROPLASTY Right 08/19/2013   Procedure: RIGHT TOTAL KNEE ARTHROPLASTY;  Surgeon: Gearlean Alf, MD;  Location: [facility];  Service: Orthopedics;  Laterality: Right;  Marland Kitchen VASECTOMY       reports that he has quit smoking. He has never used smokeless tobacco. He reports current alcohol use of about 1.0 standard drinks of alcohol per week. He reports that he does not use drugs.  Allergies  Allergen Reactions  . Cephalexin Diarrhea    Family History  Problem Relation Age of Onset  . Heart attack Mother   . Heart attack Father   . Stroke Father     Prior to Admission medications   Medication Sig Start Date End Date Taking? Authorizing Provider  acetaminophen (TYLENOL) 500 MG tablet Take 500 mg by mouth every 6 (six) hours as needed (Pain).    [provider]  Bilberry, Vaccinium myrtillus, (BILBERRY EXTRACT PO) Take by mouth daily.    [provider]  calcium carbonate 200 MG capsule Take 800 mg by mouth daily.    [provider]  cholecalciferol (VITAMIN D) 1000 UNITS tablet Take 1,000 Units by mouth daily.    [provider]  Cyanocobalamin (VITAMIN B 12 PO) Take 1,000 mg by mouth daily.    [provider]  famotidine (PEPCID AC) 10 MG chewable tablet Chew 10 mg by mouth at bedtime.    [provider]  glucosamine-chondroitin 500-400 MG tablet Take 1 tablet by mouth 3 (three) times daily.    [provider]  Multiple Vitamins-Minerals (PRESERVISION AREDS) TABS Take 1 tablet by mouth daily.    [provider]  MYRBETRIQ 50 MG TB24 Take 1 tablet by mouth daily. Takes at [facility] 07/19/12   [provider]  Omega-3 Fatty Acids (FISH OIL) 1200 MG CAPS Take 1 capsule by mouth daily.    [provider]  ramipril (ALTACE) 5 MG capsule TAKE 1 CAPSULE(5 MG) BY MOUTH TWICE DAILY 09/17/18   Minus Breeding, MD  terazosin (HYTRIN) 2 MG capsule  Take 2 mg by mouth at bedtime.      [provider]  tobramycin (TOBREX) 0.3 % ophthalmic solution  02/11/19   [provider]  vitamin C (ASCORBIC ACID) 500 MG tablet Take 500 mg by mouth daily.    [provider]  warfarin (COUMADIN) 5 MG tablet TAKE 1/2 TO 1 TABLET BY MOUTH DAILY AS DIRECTED BY COUMADIN CLINIC 01/07/19   Minus Breeding, MD    Physical Exam: Vitals:   03/21/19 0017 03/21/19 0137 03/21/19 0251 03/21/19 0430  BP: (!) 150/91 (!) 149/100 (!) 144/93 (!) 148/89  Pulse: 74 (!) 47 (!) 58 (!) 136  Resp: (!) 21 (!) 24 (!) 25 (!) 28  Temp:      TempSrc:      SpO2: 96% 94% 93% 93%  Weight:      Height:        Physical Exam  Constitutional: He is oriented to person, place, and time. No distress.  HENT:  Head: Normocephalic.  Eyes: Right eye exhibits no discharge. Left eye exhibits no discharge.  Cardiovascular: Normal rate, regular rhythm and intact distal pulses.  Pulmonary/Chest: Effort normal. He has no wheezes.  Bibasilar rales  Abdominal: Soft. Bowel sounds are normal. He exhibits no distension. There is no abdominal tenderness. There is no guarding.  Musculoskeletal:        General: No edema.     Cervical back: Neck supple.  Neurological: He is alert and oriented to person, place, and time.  Skin: Skin is warm and dry. He is not diaphoretic.     Labs on Admission: I have personally reviewed following labs and imaging studies  CBC: Recent Labs  Lab 03/20/19 2210 03/21/19 0351  WBC 11.5* 11.1*  NEUTROABS 9.3*  --   HGB 14.0 13.3  HCT 41.8 40.9  MCV 96.1 95.8  PLT 280 500   Basic Metabolic Panel: Recent Labs  Lab 03/20/19 2210  NA 135  K 5.2*  CL 103  CO2 20*  GLUCOSE 137*  BUN 45*  CREATININE 1.14  CALCIUM 9.3   GFR: Estimated Creatinine Clearance: 42.6 mL/min (by C-G formula based on SCr of 1.14 mg/dL). Liver Function Tests: Recent Labs  Lab 03/20/19 2210  AST 88*  ALT 104*  ALKPHOS 135*  BILITOT 0.9  PROT  6.4*  ALBUMIN 3.4*   No results for input(s): LIPASE, AMYLASE in the last 168 hours. No results for input(s): AMMONIA in the last 168 hours. Coagulation Profile: Recent Labs  Lab 03/20/19 2315 03/21/19 0351  INR 3.8* 3.9*   Cardiac Enzymes: No results for input(s): CKTOTAL, CKMB, CKMBINDEX, TROPONINI in the last 168 hours. BNP (last 3 results) No results for input(s): PROBNP in the last 8760 hours. HbA1C: No results for  input(s): HGBA1C in the last 72 hours. CBG: No results for input(s): GLUCAP in the last 168 hours. Lipid Profile: No results for input(s): CHOL, HDL, LDLCALC, TRIG, CHOLHDL, LDLDIRECT in the last 72 hours. Thyroid Function Tests: No results for input(s): TSH, T4TOTAL, FREET4, T3FREE, THYROIDAB in the last 72 hours. Anemia Panel: No results for input(s): VITAMINB12, FOLATE, FERRITIN, TIBC, IRON, RETICCTPCT in the last 72 hours. Urine analysis:    Component Value Date/Time   COLORURINE YELLOW 03/20/2019 [place] 03/20/2019 2215   LABSPEC 1.030 03/20/2019 2215   PHURINE 5.0 03/20/2019 2215   [place] 03/20/2019 2215   [place] 03/20/2019 2215   [place] 03/20/2019 2215   KETONESUR 5 (A) 03/20/2019 2215   PROTEINUR 100 (A) 03/20/2019 2215   UROBILINOGEN 2.0 (H) 08/09/2013 1417   NITRITE NEGATIVE 03/20/2019 2215   LEUKOCYTESUR NEGATIVE 03/20/2019 2215    Radiological Exams on Admission: DG Chest 2 View  Result Date: 03/20/2019 CLINICAL DATA:  Weakness over the last 2 days. EXAM: CHEST - 2 VIEW COMPARISON:  08/09/2013 FINDINGS: Cardiomegaly. Aortic atherosclerosis. Abnormal interstitial lung markings, most consistent with fluid overload/congestive heart failure. Cannot rule out the possibility of viral pneumonia. No dense consolidation or lobar collapse. Minimal blunting of the costophrenic angles but no large effusion. IMPRESSION: Cardiomegaly. Probable mild pulmonary edema/congestive heart failure. Cannot rule out  the possibility of viral pneumonia. Electronically Signed   By: Nelson Chimes M.D.   On: 03/20/2019 13:54   DG Chest Port 1 View  Result Date: 03/20/2019 CLINICAL DATA:  Shortness of breath EXAM: PORTABLE CHEST 1 VIEW COMPARISON:  03/20/2019 FINDINGS: Cardiomegaly, vascular congestion. Mild interstitial prominence could reflect interstitial edema. No effusions or acute bony abnormality. IMPRESSION: Cardiomegaly with vascular congestion and possible mild interstitial edema. Findings similar to prior study. Electronically Signed   By: Rolm Baptise M.D.   On: 03/20/2019 21:32   US Abdomen Limited RUQ  Result Date: 03/21/2019 CLINICAL DATA:  Elevated liver function tests EXAM: ULTRASOUND ABDOMEN LIMITED RIGHT UPPER QUADRANT COMPARISON:  None. FINDINGS: Gallbladder: No gallstones or wall thickening visualized. No sonographic Murphy sign noted by sonographer. Common bile duct: Diameter: 4 mm Liver: 16 mm left hepatic cyst. 24 mm right hepatic cyst. No evidence of solid mass. Within normal limits in parenchymal echogenicity. Portal vein is patent on color Doppler imaging with normal direction of blood flow towards the liver. IMPRESSION: 1. No explanation for the history.  No acute finding. 2. Small, incidental hepatic cysts. Electronically Signed   By: Monte Fantasia M.D.   On: 03/21/2019 04:15    EKG: Independently reviewed.  A. fib, heart rate 74.  New LBBB.  Assessment/Plan Principal Problem:   CHF (congestive heart failure) (HCC) Active Problems:   HYPERTENSION, BENIGN   Atrial fibrillation (HCC)   LBBB (left bundle branch block)   Elevated LFTs   Volume overload -suspect secondary to new onset CHF Patient with complaints of dyspnea, orthopnea, and cough.  BNP 3183. Chest x-ray showing cardiomegaly with vascular congestion and possible mild interstitial edema.  Patient is mildly tachypneic but not hypoxic.  Echo done in 2016 with normal systolic function and moderate mitral regurgitation.   Worsening mitral regurgitation could also be possibly contributing. -Cardiac monitoring -Repeat echocardiogram -Patient received IV Lasix 20 mg daily.  Continue IV Lasix 20 mg twice daily starting in the morning. -Monitor intake and output, daily weights, low-sodium diet with fluid restriction -Continuous pulse ox -Supplemental oxygen to keep oxygen saturation above 92%  New LBBB on EKG Patient denies chest pain.  High-sensitivity troponin 103. Mild troponin elevation likely due to demand ischemia from volume overload/CHF.  Discussed with cardiology. -Cardiac monitoring -Continue to trend troponin -Echocardiogram  Atrial fibrillation Currently rate controlled.  Takes Coumadin and INR supratherapeutic at 3.8.  No signs of active bleeding. -Hold Coumadin at this time -Repeat INR in a.m.  Hypertension Systolic currently in 409W. -Lasix as above for diuresis  Mild leukocytosis Patient is afebrile.  Chest x-ray not suggestive of pneumonia.  UA not suggestive of UTI. -Continue to monitor  Mild hyperkalemia Potassium 5.2.  Likely related to home ACE inhibitor use. -Continue to monitor  Elevated LFTs AST 88, ALT 104, alk phos 135.  T bili normal.  No recent labs for comparison.  Abdominal exam benign.  Ultrasound with small hepatic cysts but no other acute finding.  Suspect mild elevation of LFTs is related to decompensated CHF. -Management of CHF as mentioned above -Continue to monitor LFTs  Pharmacy med rec pending.  DVT prophylaxis: On chronic anticoagulation with Coumadin and INR supratherapeutic. Code Status: DNR.  Discussed with the patient. Family Communication: No family available. Disposition Plan: Anticipate discharge after clinical improvement. Admission status: It is my clinical opinion that admission to INPATIENT is reasonable and necessary in this 85 y.o. male . presenting with new onset CHF and new left bundle branch block on EKG.  Needs diuresis with Lasix and  echocardiogram in the morning for further evaluation.  Given the aforementioned, the predictability of an adverse outcome is felt to be significant. I expect that the patient will require at least 2 midnights in the hospital to treat this condition.   The medical decision making on this patient was of high complexity and the patient is at high risk for clinical deterioration, therefore this is a level 3 visit.  Shela Leff MD [facility] Pager [phone]  If 7PM-7AM, please contact night-coverage [email or web address] Password [email or web address]  03/21/2019, 4:59 AM

## 2019-03-21 NOTE — Progress Notes (Signed)
  Echocardiogram 2D Echocardiogram has been performed.  Darlina Sicilian M 03/21/2019, 9:17 AM

## 2019-03-21 NOTE — ED Notes (Signed)
Pt sitting up to eat.

## 2019-03-21 NOTE — ED Notes (Signed)
Lunch Tray Ordered @ 1117. 

## 2019-03-21 NOTE — Progress Notes (Signed)
Patient had 7 beat run of V-tach per tele. Event reported to night shift. Vital signs documented after tele event reported. MD paged  height is 5\' 10"  (1.778 m) and weight is 72.8 kg. His oral temperature is 97.7 F (36.5 C). His blood pressure is 134/74 and his pulse is 57 (abnormal). His respiration is 16 and oxygen saturation is 100%.

## 2019-03-21 NOTE — Consult Note (Addendum)
Cardiology Consultation:   Patient ID: Gregory Werner MRN: [id number]; DOB: 1924-10-07  Admit date: 03/20/2019 Date of Consult: 03/21/2019  Primary Care Provider: [name], DO Primary Cardiologist: Gregory Breeding, MD  Primary Electrophysiologist:  None    Patient Profile:   Gregory Werner is a 85 y.o. male with a hx of permanent Afib on coumadin, HTN, HLD, prostate cancer who is being seen today for the evaluation of new onset Heart failure at the request of Gregory Werner.  History of Present Illness:   Gregory Werner follows with Gregory Werner for the above cardiac issues. He has been following many years for afib. He is historically asymptomatic in afib. He has been on coumadin for stroke prevention. He does not appear to be on anything for rate control. His last echo in 2016 showed EF 60-65%, moderate MR. His last appointment was February 2020 and was stable from a cardiac standpoint.  The patient presented to the ED 03/20/19 for EKG changes sent from his PCP. He reported a cough and shortness of breath for the last couple days. He says is has chronic shortness of breath on exertion at baseline but the last 4 days it has been more severe. His feels his cough is related to his trouble breathing in from his nose. He denies orthopnea, weight gain, and lower leg edema. He denies chest pain.   In the ED B/P 168/95, pulse 68, afebrile, RR 16, 97% O2. Labs showed ptassium 5.2, CO2, 20, glucose 137, Albumin 3.4, AST 88, ALT 104, creatinine 1.14 . WBC 11.5, Hgb 13.3. BNP 3,183. INR 3.9. COVID negative. CXR showed cardiomegaly with pulmonary edema/CHF. HS troponin 103 > 109. EKG showed Afib 74 bpm, new LBBB QRS 152 ms. Older EKG appear to have iRBBB  Patient was given Lasix IV 20 mg and admitted.   Heart Pathway Score:     Past Medical History:  Diagnosis Date  . Arthritis   . Atrial fibrillation ([place])   . Diverticulosis    history  . Dysphagia 2001   history s/p esophageal dilatation  .  Dysrhythmia    hx. Atrial Fibrillation, RBBB  . GERD (gastroesophageal reflux disease)   . Hearing loss    bilateral hearing aids  . Hyperlipidemia   . Hypertension    benign  . Lung infiltrate 05/2010   left lower lobe infiltrate, questionable pneumonia  . Prostate cancer [facility]) 2001   treated with radiation    Past Surgical History:  Procedure Laterality Date  . CATARACT EXTRACTION  2008  . ESOPHAGEAL DILATION  2001  . EYE SURGERY    . JOINT REPLACEMENT     LTKA  . PROSTATE SURGERY    . REPLACEMENT TOTAL KNEE  2012   left  . TONSILLECTOMY    . TOTAL KNEE ARTHROPLASTY Right 08/19/2013   Procedure: RIGHT TOTAL KNEE ARTHROPLASTY;  Surgeon: Gearlean Alf, MD;  Location: [facility];  Service: Orthopedics;  Laterality: Right;  Marland Kitchen VASECTOMY       Home Medications:  Prior to Admission medications   Medication Sig Start Date End Date Taking? Authorizing Provider  acetaminophen (TYLENOL) 500 MG tablet Take 1,000 mg by mouth every 6 (six) hours as needed for mild pain (Pain).    Yes [provider]  Bilberry, Vaccinium myrtillus, (BILBERRY EXTRACT PO) Take by mouth daily.   Yes [provider]  calcium carbonate 200 MG capsule Take 800 mg by mouth daily.   Yes [provider]  cholecalciferol (VITAMIN D)  1000 UNITS tablet Take 1,000 Units by mouth daily.   Yes [provider]  famotidine (PEPCID AC) 10 MG chewable tablet Chew 10 mg by mouth at bedtime.   Yes [provider]  glucosamine-chondroitin 500-400 MG tablet Take 1 tablet by mouth 3 (three) times daily.   Yes [provider]  Multiple Vitamins-Minerals (PRESERVISION AREDS) TABS Take 2 tablets by mouth daily.    Yes [provider]  MYRBETRIQ 50 MG TB24 Take 1 tablet by mouth daily. Takes at [facility] 07/19/12  Yes [provider]  Omega-3 Fatty Acids (FISH OIL) 1200 MG CAPS Take 1 capsule by mouth daily.   Yes [provider]  ramipril (ALTACE) 5 MG capsule  TAKE 1 CAPSULE(5 MG) BY MOUTH TWICE DAILY Patient taking differently: Take 5 mg by mouth 2 (two) times daily.  09/17/18  Yes Gregory Breeding, MD  terazosin (HYTRIN) 2 MG capsule Take 2 mg by mouth at bedtime.     Yes [provider]  vitamin C (ASCORBIC ACID) 500 MG tablet Take 500 mg by mouth daily.   Yes [provider]  warfarin (COUMADIN) 5 MG tablet TAKE 1/2 TO 1 TABLET BY MOUTH DAILY AS DIRECTED BY COUMADIN CLINIC Patient taking differently: Take 2.5-5 mg by mouth See admin instructions. TAKE 1/2 tablet (2.5 mg ) Mon, Wed, Friday,  Then 5mg  on Sunday, Tues, Thur,Saturday 01/07/19  Yes Hochrein, Jeneen Rinks, MD  Cyanocobalamin (VITAMIN B 12 PO) Take 1,000 mg by mouth daily.    [provider]    Inpatient Medications: Scheduled Meds: . furosemide  20 mg Intravenous BID  . sodium chloride flush  3 mL Intravenous Q12H   Continuous Infusions: . sodium chloride     PRN Meds: sodium chloride, acetaminophen, sodium chloride flush  Allergies:    Allergies  Allergen Reactions  . Cephalexin Diarrhea    Social History:   Social History   Socioeconomic History  . Marital status: Married    Spouse name: Not on file  . Number of children: Not on file  . Years of education: Not on file  . Highest education level: Not on file  Occupational History  . Occupation: retired    Comment: Water quality scientist  Tobacco Use  . Smoking status: Former Research scientist (life sciences)  . Smokeless tobacco: Never Used  . Tobacco comment: smoked only 7 years and not everyday  Substance and Sexual Activity  . Alcohol use: Yes    Alcohol/week: 1.0 standard drinks    Types: 1 Cans of beer per week    Comment: beer nightly 1  . Drug use: No  . Sexual activity: Never    Birth control/protection: Abstinence  Other Topics Concern  . Not on file  Social History Narrative  . Not on file   Social Determinants of Health   Financial Resource Strain:   . Difficulty of Paying Living Expenses: Not on file    Food Insecurity:   . Worried About Charity fundraiser in the Last Year: Not on file  . Ran Out of Food in the Last Year: Not on file  Transportation Needs:   . Lack of Transportation (Medical): Not on file  . Lack of Transportation (Non-Medical): Not on file  Physical Activity:   . Days of Exercise per Week: Not on file  . Minutes of Exercise per Session: Not on file  Stress:   . Feeling of Stress : Not on file  Social Connections:   . Frequency of Communication with Friends and  Family: Not on file  . Frequency of Social Gatherings with Friends and Family: Not on file  . Attends Religious Services: Not on file  . Active Member of Clubs or Organizations: Not on file  . Attends Archivist Meetings: Not on file  . Marital Status: Not on file  Intimate Partner Violence:   . Fear of Current or Ex-Partner: Not on file  . Emotionally Abused: Not on file  . Physically Abused: Not on file  . Sexually Abused: Not on file    Family History:   Family History  Problem Relation Age of Onset  . Heart attack Mother   . Heart attack Father   . Stroke Father      ROS:  Please see the history of present illness.  All other ROS reviewed and negative.     Physical Exam/Data:   Vitals:   03/21/19 0251 03/21/19 0430 03/21/19 0630 03/21/19 0900  BP: (!) 144/93 (!) 148/89 (!) 154/79 (!) 145/62  Pulse: (!) 58 (!) 136 92   Resp: (!) 25 (!) 28 (!) 25 (!) 25  Temp:      TempSrc:      SpO2: 93% 93% 93% 94%  Weight:      Height:       No intake or output data in the 24 hours ending 03/21/19 1008 Last 3 Weights 03/20/2019 05/14/2018 05/08/2017  Weight (lbs) 160 lb 7.9 oz 160 lb 6.4 oz 159 lb  Weight (kg) 72.8 kg 72.757 kg 72.122 kg     Body mass index is 23.03 kg/m.  General:  Well nourished, well developed, in no acute distress HEENT: normal Lymph: no adenopathy Neck: + JVD Endocrine:  No thryomegaly Vascular: No carotid bruits; FA pulses 2+ bilaterally without bruits   Cardiac:  normal S1, S2; Irreg Irreg; systolic murmur  Lungs:  clear to auscultation bilaterally, no wheezing, rhonchi or rales  Abd: soft, nontender, no hepatomegaly  Ext: no edema Musculoskeletal:  No deformities, BUE and BLE strength normal and equal Skin: warm and dry  Neuro:  CNs 2-12 intact, no focal abnormalities noted Psych:  Normal affect   EKG:  The EKG was personally reviewed and demonstrates:  Afib, 74 bpm, LBBB QRS 152 ms, LAD Telemetry:  Telemetry was personally reviewed and demonstrates:  Afib HR 60-70s, frequent PVCs  Relevant CV Studies:  Echo  1. Left ventricular ejection fraction, by visual estimation, is 30 to 35%. The left ventricle has severely decreased function. Left ventricular septal wall thickness was severely increased. Mildly increased left ventricular posterior wall thickness.  There is mildly increased left ventricular hypertrophy.  2. Abnormal septal motion consistent with left bundle branch block.  3. Left ventricular diastolic function could not be evaluated.  4. The left ventricle demonstrates global hypokinesis.  5. Global right ventricle has moderately reduced systolic function.The right ventricular size is normal. No increase in right ventricular wall thickness.  6. Left atrial size was severely dilated.  7. Right atrial size was not assessed.  8. The mitral valve is abnormal. Trivial mitral valve regurgitation.  9. The tricuspid valve is grossly normal. Tricuspid valve regurgitation is mild. 10. The aortic valve is tricuspid. Aortic valve regurgitation is mild. Mild aortic valve sclerosis without stenosis. 11. The pulmonic valve was grossly normal. Pulmonic valve regurgitation is trivial. 12. Aortic dilatation noted. 13. There is mild dilatation of the ascending aorta and of the aortic root measuring 40 mm. 14. Moderately elevated pulmonary artery systolic pressure. 15. The tricuspid regurgitant  velocity is 3.40 m/s, and with an assumed right  atrial pressure of 15 mmHg, the estimated right ventricular systolic pressure is moderately elevated at 61.2 mmHg. 16. The inferior vena cava is dilated in size with <50% respiratory variability, suggesting right atrial pressure of 15 mmHg.   Laboratory Data:  High Sensitivity Troponin:   Recent Labs  Lab 03/21/19 0200 03/21/19 0351  TROPONINIHS 103* 100*     Chemistry Recent Labs  Lab 03/20/19 2210  NA 135  K 5.2*  CL 103  CO2 20*  GLUCOSE 137*  BUN 45*  CREATININE 1.14  CALCIUM 9.3  GFRNONAA 56*  GFRAA >60  ANIONGAP 12    Recent Labs  Lab 03/20/19 2210 03/21/19 0351  PROT 6.4* 5.8*  ALBUMIN 3.4* 3.1*  AST 88* 78*  ALT 104* 99*  ALKPHOS 135* 127*  BILITOT 0.9 1.4*   Hematology Recent Labs  Lab 03/20/19 2210 03/21/19 0351  WBC 11.5* 11.1*  RBC 4.35 4.27  HGB 14.0 13.3  HCT 41.8 40.9  MCV 96.1 95.8  MCH 32.2 31.1  MCHC 33.5 32.5  RDW 13.1 13.3  PLT 280 281   BNP Recent Labs  Lab 03/20/19 2226  BNP 3,183.6*    DDimer No results for input(s): DDIMER in the last 168 hours.   Radiology/Studies:  DG Chest 2 View  Result Date: 03/20/2019 CLINICAL DATA:  Weakness over the last 2 days. EXAM: CHEST - 2 VIEW COMPARISON:  08/09/2013 FINDINGS: Cardiomegaly. Aortic atherosclerosis. Abnormal interstitial lung markings, most consistent with fluid overload/congestive heart failure. Cannot rule out the possibility of viral pneumonia. No dense consolidation or lobar collapse. Minimal blunting of the costophrenic angles but no large effusion. IMPRESSION: Cardiomegaly. Probable mild pulmonary edema/congestive heart failure. Cannot rule out the possibility of viral pneumonia. Electronically Signed   By: Nelson Chimes M.D.   On: 03/20/2019 13:54   DG Chest Port 1 View  Result Date: 03/20/2019 CLINICAL DATA:  Shortness of breath EXAM: PORTABLE CHEST 1 VIEW COMPARISON:  03/20/2019 FINDINGS: Cardiomegaly, vascular congestion. Mild interstitial prominence could reflect  interstitial edema. No effusions or acute bony abnormality. IMPRESSION: Cardiomegaly with vascular congestion and possible mild interstitial edema. Findings similar to prior study. Electronically Signed   By: Rolm Baptise M.D.   On: 03/20/2019 21:32   ECHOCARDIOGRAM COMPLETE  Result Date: 03/21/2019   ECHOCARDIOGRAM REPORT   Patient Name:   Gregory Werner Zabinski Date of Exam: 03/21/2019 Medical Rec #:  [id number]     Height:       70.0 in Accession #:    [id number]    Weight:       160.5 lb Date of Birth:  [birth year]    BSA:          1.90 m Patient Age:    44 years      BP:           145/62 mmHg Patient Gender: M             HR:           61 bpm. Exam Location:  Inpatient Procedure: 2D Echo Indications:    CHF-Acute Diastolic [id number] / [id number]  History:        Patient has prior history of Echocardiogram examinations, most                 recent 03/13/2015. Arrythmias:Atrial Fibrillation; Risk                 Factors:Hypertension and Dyslipidemia. History of  prostate                 cancer, GERD.  Sonographer:    Darlina Sicilian RDCS Referring Phys: [id number] [place]  1. Left ventricular ejection fraction, by visual estimation, is 30 to 35%. The left ventricle has severely decreased function. Left ventricular septal wall thickness was severely increased. Mildly increased left ventricular posterior wall thickness. There is mildly increased left ventricular hypertrophy.  2. Abnormal septal motion consistent with left bundle branch block.  3. Left ventricular diastolic function could not be evaluated.  4. The left ventricle demonstrates global hypokinesis.  5. Global right ventricle has moderately reduced systolic function.The right ventricular size is normal. No increase in right ventricular wall thickness.  6. Left atrial size was severely dilated.  7. Right atrial size was not assessed.  8. The mitral valve is abnormal. Trivial mitral valve regurgitation.  9. The tricuspid valve is grossly normal.  Tricuspid valve regurgitation is mild. 10. The aortic valve is tricuspid. Aortic valve regurgitation is mild. Mild aortic valve sclerosis without stenosis. 11. The pulmonic valve was grossly normal. Pulmonic valve regurgitation is trivial. 12. Aortic dilatation noted. 13. There is mild dilatation of the ascending aorta and of the aortic root measuring 40 mm. 14. Moderately elevated pulmonary artery systolic pressure. 15. The tricuspid regurgitant velocity is 3.40 m/s, and with an assumed right atrial pressure of 15 mmHg, the estimated right ventricular systolic pressure is moderately elevated at 61.2 mmHg. 16. The inferior vena cava is dilated in size with <50% respiratory variability, suggesting right atrial pressure of 15 mmHg. FINDINGS  Left Ventricle: Left ventricular ejection fraction, by visual estimation, is 30 to 35%. The left ventricle has severely decreased function. The left ventricle demonstrates global hypokinesis. Mildly increased left ventricular posterior wall thickness. There is mildly increased left ventricular hypertrophy. Abnormal (paradoxical) septal motion, consistent with left bundle branch block. The left ventricular diastology could not be evaluated due to atrial fibrillation. Left ventricular diastolic function  could not be evaluated. Right Ventricle: The right ventricular size is normal. No increase in right ventricular wall thickness. Global RV systolic function is has moderately reduced systolic function. The tricuspid regurgitant velocity is 3.40 m/s, and with an assumed right atrial pressure of 15 mmHg, the estimated right ventricular systolic pressure is moderately elevated at 61.2 mmHg. Left Atrium: Left atrial size was severely dilated. Right Atrium: Right atrial size was not assessed Pericardium: There is no evidence of pericardial effusion. Mitral Valve: The mitral valve is abnormal. There is mild thickening of the mitral valve leaflet(s). Trivial mitral valve regurgitation.  Tricuspid Valve: The tricuspid valve is grossly normal. Tricuspid valve regurgitation is mild. Aortic Valve: The aortic valve is tricuspid. Aortic valve regurgitation is mild. Mild aortic valve sclerosis is present, with no evidence of aortic valve stenosis. Pulmonic Valve: The pulmonic valve was grossly normal. Pulmonic valve regurgitation is trivial. Pulmonic regurgitation is trivial. Aorta: Aortic dilatation noted. There is mild dilatation of the ascending aorta and of the aortic root measuring 40 mm. Venous: The inferior vena cava is dilated in size with less than 50% respiratory variability, suggesting right atrial pressure of 15 mmHg. IAS/Shunts: No atrial level shunt detected by color flow Doppler.  LEFT VENTRICLE PLAX 2D LVIDd:         4.30 cm LVIDs:         3.80 cm LV PW:         1.20 cm LV IVS:  2.00 cm LVOT diam:     1.70 cm LV SV:         21 ml LV SV Index:   11.12 LVOT Area:     2.27 cm  LV Volumes (MOD) LV area d, A2C:    25.70 cm LV area d, A4C:    34.90 cm LV area s, A2C:    23.80 cm LV area s, A4C:    27.50 cm LV major d, A2C:   7.37 cm LV major d, A4C:   8.09 cm LV major s, A2C:   7.18 cm LV major s, A4C:   7.86 cm LV vol d, MOD A2C: 77.0 ml LV vol d, MOD A4C: 124.0 ml LV vol s, MOD A2C: 66.4 ml LV vol s, MOD A4C: 80.8 ml LV SV MOD A2C:     10.6 ml LV SV MOD A4C:     124.0 ml LV SV MOD BP:      25.3 ml RIGHT VENTRICLE RV S prime:     6.81 cm/s TAPSE (M-mode): 1.6 cm LEFT ATRIUM              Index LA diam:        4.20 cm  2.21 cm/m LA Vol (A2C):   104.6 ml 55.00 ml/m LA Vol (A4C):   121.0 ml 63.65 ml/m LA Biplane Vol: 112.0 ml 58.92 ml/m  AORTIC VALVE LVOT Vmax:   113.00 cm/s LVOT Vmean:  66.400 cm/s LVOT VTI:    0.154 m  AORTA Ao Root diam: 4.20 cm Ao Asc diam:  3.95 cm MITRAL VALVE                       TRICUSPID VALVE MV Area (PHT): 6.71 cm            TR Peak grad:   46.2 mmHg MV PHT:        32.77 msec          TR Vmax:        359.00 cm/s MV Decel Time: 113 msec MV E velocity:  73.25 cm/s 103 cm/s SHUNTS                                    Systemic VTI:  0.15 m                                    Systemic Diam: 1.70 cm  Lyman Bishop MD Electronically signed by Lyman Bishop MD Signature Date/Time: 03/21/2019/10:02:12 AM    Final    US Abdomen Limited RUQ  Result Date: 03/21/2019 CLINICAL DATA:  Elevated liver function tests EXAM: ULTRASOUND ABDOMEN LIMITED RIGHT UPPER QUADRANT COMPARISON:  None. FINDINGS: Gallbladder: No gallstones or wall thickening visualized. No sonographic Murphy sign noted by sonographer. Common bile duct: Diameter: 4 mm Liver: 16 mm left hepatic cyst. 24 mm right hepatic cyst. No evidence of solid mass. Within normal limits in parenchymal echogenicity. Portal vein is patent on color Doppler imaging with normal direction of blood flow towards the liver. IMPRESSION: 1. No explanation for the history.  No acute finding. 2. Small, incidental hepatic cysts. Electronically Signed   By: Monte Fantasia M.D.   On: 03/21/2019 04:15    Assessment and Plan:   New onset Acute Systolic Heart Failure Patient presented with cough and sob  for the last couple days. BNP elevated to 3,000. CXR significant for cardiomegaly with vascular congestion and mild interstitial edema. Given IV lasix 20mg  in the ED. Echo in 2016 showed preserved EF. - Started on IV lasix 20 mg BID - Patient is on 4L O2 (not on O2 at baseline) - creatinine is 1.2>>continue to follow - potassium 5.2 - Strict I/Os - Daily weights - Continue current Lasix dose - Echo this admission with new low EF 30-35% - Spoke to the patient about cardiac catheterization. He is unsure if he would like to proceed with it given age and risks. We have a couple days before INR is stable cath and breathing is better and he is able to lay flat. MD to see. Will revisit in a couple days.   New Cardiomyopathy - EF down to 30-35% - Patient has not had an ischemic evaluation - Denies history of chest pain - Possible  plan for cardiac cath as above for ischemic evaluation  Afib  - H/o of permanent afib on coumadin. Not on rate control medication at baseline - Has been historically asymptomatic in afib - Rates controlled. 60-70s - Hold coumadin - INR 3.9. Heparin per pharmacy when INR <2  LBBB - Appears to be new - iRBBB on previous EKGs - Plan as above  HTN - On ramipril at home>> held for diuresis/renal function - BP elevated   HLD - No recent labs in the system - Not on lipid lower agent at baseline - Will check Lipid panel  Elevated LFTs - AST and ALT moderately elevated - Patient reported 1 beer daily for the last 10 years - US abdomen ordered      For questions or updates, please contact [facility] Please consult [email or web address] for contact info under     Signed, Cadence Ninfa Meeker, PA-C  03/21/2019 10:08 AM   ---------------------------------------------------------------------------------------------   History and all data above reviewed.  Patient examined.  I agree with the findings as above.  Gregory Werner is a pleasant 85 year old gentleman who is a patient of my partner Dr. Minus Werner at [facility] on whom we were consulted for new onset heart failure and a new left bundle branch block.  Mr. Marg tells me that he does physical therapy exercises for the past few months, however in the past few days he has noticed that it has been more difficult to complete his activities without fatigue and shortness of breath.  On review of Dr. Rosezella Florida last office visit in February, it appears the patient was able to easily do his water aerobics without heart failure symptoms.  He presented to his PCP yesterday with cough and shortness of breath for the past few days.  He denied any episodes of chest pain.  On ECG he was noted to have a left bundle branch block which is new, review of previous ECGs demonstrates an incomplete right bundle branch block.  He was given 20 mg of IV  Lasix.  He has a known diagnosis of atrial fibrillation and on previous office visits has had slow ventricular response.  He is chronically anticoagulated with warfarin and is not interested in transition to per previous office notes.  Constitutional: No acute distress Cardiovascular: Irregular rhythm, normal rate around 70, no murmurs. S1 and S2 normal. Radial pulses normal bilaterally.  JVP is elevated to the angle of the jaw at approximately 30 degrees. Respiratory: Crackles in the bilateral bases GI : normal bowel sounds, soft and nontender. No distention.  MSK: extremities warm, well perfused. No edema.  NEURO: grossly nonfocal exam, moves all extremities. PSYCH: alert and oriented x 3, normal mood and affect.   All available labs, radiology testing, previous records reviewed. Agree with documented assessment and plan of my colleague as stated above with the following additions or changes:  Principal Problem:   CHF (congestive heart failure) (HCC) Active Problems:   HYPERTENSION, BENIGN   Atrial fibrillation (HCC)   LBBB (left bundle branch block)   Elevated LFTs    Plan: Mr. Woltz presents with a newly reduced ejection fraction and evidence of acute combined systolic and diastolic heart failure without preceding episode of chest pain.  He does however have evidence of a new left bundle branch block, concerning for an interval ischemic event.  We discussed that evaluation for ischemia is warranted however there are several barriers.  First the patient would like to consult with his wife and feels that perhaps he is high risk given his age for any procedures.  I have let the patient know that we have time to make this decision as there is no urgency for coronary angiography, and we can reevaluate over the coming days.  This is facilitated by the fact that his INR is supratherapeutic at the moment 3.9.  We will need to hold Coumadin and observe his INR prior to any consideration of  angiography.  He denies any bleeding, melena, hemoptysis, hematemesis, or hematochezia.  Finally it would be challenging to perform angiography and lay the patient flat while he is volume overloaded.  He has evidence of jugular venous distention, and requires diuresis.  Acute combined systolic and diastolic HF-  -Lasix 20 mg twice daily IV -No beta-blocker at this time given his history of A. fib with slow ventricular response. -Can consider ACE inhibitor/ARB/ARNI, blood pressure is mildly elevated.  We will see how the patient's blood pressure responds to diuresis and consider this for tomorrow.  Atrial fibrillation -currently rate controlled, 70 bpm.  No additional therapy is recommended at this time, observe for slow ventricular response.  He is anticoagulated with warfarin with a supratherapeutic INR of 3.9 today  New LBBB -he will likely need an ischemic evaluation as detailed above.  Consider coronary angiography, patient would like to think about this and discuss with his wife which is appropriate.  Mitral regurgitation-on today's echocardiogram appears mild.  No significant murmur on exam.  HTN -blood pressure is mildly elevated today however the patient will be diuresed, we will observe and determine if changes in therapy are required.  He takes ramipril 5 mg twice daily at home and is also on terazosin and 2 mg QHS.  Cardiology will follow along  Length of Stay:  LOS: 0 days   Elouise Munroe, MD [facility] 1:05 PM  03/21/2019

## 2019-03-21 NOTE — Progress Notes (Addendum)
ANTICOAGULATION CONSULT NOTE - Initial Consult  Pharmacy Consult for warfarin Indication: atrial fibrillation  Allergies  Allergen Reactions  . Cephalexin Diarrhea    Patient Measurements: Height: 5\' 10"  (177.8 cm) Weight: 160 lb 7.9 oz (72.8 kg) IBW/kg (Calculated) : 73 Heparin Dosing Weight: 72.8 kg   Vital Signs: BP: 145/62 (12/17 0900) Pulse Rate: 92 (12/17 0630)  Labs: Recent Labs    03/20/19 2210 03/20/19 2315 03/21/19 0200 03/21/19 0351  HGB 14.0  --   --  13.3  HCT 41.8  --   --  40.9  PLT 280  --   --  281  LABPROT  --  37.4*  --  38.2*  INR  --  3.8*  --  3.9*  CREATININE 1.14  --   --   --   TROPONINIHS  --   --  103* 100*    Estimated Creatinine Clearance: 42.6 mL/min (by C-G formula based on SCr of 1.14 mg/dL).   Medical History: Past Medical History:  Diagnosis Date  . Arthritis   . Atrial fibrillation ([place])   . Diverticulosis    history  . Dysphagia 2001   history s/p esophageal dilatation  . Dysrhythmia    hx. Atrial Fibrillation, RBBB  . GERD (gastroesophageal reflux disease)   . Hearing loss    bilateral hearing aids  . Hyperlipidemia   . Hypertension    benign  . Lung infiltrate 05/2010   left lower lobe infiltrate, questionable pneumonia  . Prostate cancer ([place]) 2001   treated with radiation    Medications:  Scheduled:  . furosemide  20 mg Intravenous BID  . sodium chloride flush  3 mL Intravenous Q12H    Assessment: 87 yom presenting with acute heart failure exacerbation. On warfarin PTA for hx Afib - PTA regimen 5 mg daily except 2.5 mg MWF (LD 12/16 at 0700).   INR on admit 3.8 - INR 3.9 this morning. Hgb 13.3, plt 281. No s/sx of bleeding.   Plan for INR once INR<2.   Goal of Therapy:  INR 2-3 Monitor platelets by anticoagulation protocol: Yes   Plan:  Start heparin infusion once INR<2 - monitor INR 12/18 Continue to hold warfarin today Monitor INR, CBC, and for s/sx of bleeding  Antonietta Jewel, PharmD,  [facility] Clinical Pharmacist  Phone: [phone]  Please check AMION for all [place] phone numbers After 10:00 PM, call [place] [phone] 03/21/2019,10:12 AM

## 2019-03-21 NOTE — Progress Notes (Signed)
Patient admitted after midnight. Care began before midnight. 89 you hx afib, htn admitted with acute heart failure. Elevated BNP, abnormal ekg, orthopnea, vascular congestion on xray.   IV lasix started.   Acute CHF. Patient with complaints of dyspnea, orthopnea, and cough.  BNP 3183. Chest x-ray showing cardiomegaly with vascular congestion and possible mild interstitial edema.  Patient is mildly tachypneic but not hypoxic.  Echo done in 2016 with normal systolic function and moderate mitral regurgitation.   -follow echocardiogram -Continue IV Lasix 20 mg twice daily. -Monitor intake and output, daily weights, low-sodium diet with fluid restriction -Continuous pulse ox -Supplemental oxygen to keep oxygen saturation above 92%  New LBBB on EKG. Patient denies chest pain.  High-sensitivity troponin 103>>100. Mild troponin elevation likely due to demand ischemia from volume overload/CHF.   -Echocardiogram -await cards recommendations  Atrial fibrillation Currently rate controlled.  home meds include Coumadin and INR supratherapeutic at 3.8.  No signs of active bleeding. -coumadin per pharmacy  Hypertension. Controlled. Home med hytrin. -Lasix as above for diuresis  Mild leukocytosis Patient is afebrile.  Chest x-ray not suggestive of pneumonia.  UA not suggestive of UTI. -Continue to monitor  Mild hyperkalemia.Potassium 5.2.  Likely related to home ACE inhibitor use. -Continue to monitor  Elevated LFTs AST 88, ALT 104, alk phos 135.  T bili normal.  No recent labs for comparison.  Abdominal exam benign.  Ultrasound with small hepatic cysts but no other acute finding.  Suspect mild elevation of LFTs is related to decompensated CHF. -Management of CHF as mentioned above -Continue to monitor LFTs  On exam patient with mild increased work of breathing with conversation +JVD trace LE edema. BS with crackles bilateral bases   Dyanne Carrel, NP

## 2019-03-21 NOTE — ED Notes (Signed)
The pt reports that he feels like his breathing is getting better rapid breathing

## 2019-03-21 NOTE — ED Notes (Signed)
[place] transported to [place], [place] notified that [place] was -; however, a confirmatory test has not been placed. This nurse advised I was unable to remove isolation precautions at this time.

## 2019-03-22 ENCOUNTER — Encounter (HOSPITAL_COMMUNITY): Payer: Self-pay | Admitting: Internal Medicine

## 2019-03-22 ENCOUNTER — Other Ambulatory Visit: Payer: Self-pay

## 2019-03-22 DIAGNOSIS — I5021 Acute systolic (congestive) heart failure: Secondary | ICD-10-CM

## 2019-03-22 LAB — COMPREHENSIVE METABOLIC PANEL
ALT: 75 U/L — ABNORMAL HIGH (ref 0–44)
AST: 57 U/L — ABNORMAL HIGH (ref 15–41)
Albumin: 2.7 g/dL — ABNORMAL LOW (ref 3.5–5.0)
Alkaline Phosphatase: 111 U/L (ref 38–126)
Anion gap: 12 (ref 5–15)
BUN: 35 mg/dL — ABNORMAL HIGH (ref 8–23)
CO2: 27 mmol/L (ref 22–32)
Calcium: 8.5 mg/dL — ABNORMAL LOW (ref 8.9–10.3)
Chloride: 99 mmol/L (ref 98–111)
Creatinine, Ser: 1.08 mg/dL (ref 0.61–1.24)
GFR calc Af Amer: >60 mL/min (ref >60)
GFR calc non Af Amer: 59 mL/min — ABNORMAL LOW (ref >60)
Glucose, Bld: 101 mg/dL — ABNORMAL HIGH (ref 70–99)
Potassium: 3.5 mmol/L (ref 3.5–5.1)
Sodium: 138 mmol/L (ref 135–145)
Total Bilirubin: 1.2 mg/dL (ref 0.3–1.2)
Total Protein: 5.2 g/dL — ABNORMAL LOW (ref 6.5–8.1)

## 2019-03-22 LAB — CBC
HCT: 38.5 % — ABNORMAL LOW (ref 39.0–52.0)
Hemoglobin: 12.7 g/dL — ABNORMAL LOW (ref 13.0–17.0)
MCH: 31.9 pg (ref 26.0–34.0)
MCHC: 33 g/dL (ref 30.0–36.0)
MCV: 96.7 fL (ref 80.0–100.0)
Platelets: 254 10*3/uL (ref 150–400)
RBC: 3.98 MIL/uL — ABNORMAL LOW (ref 4.22–5.81)
RDW: 13.1 % (ref 11.5–15.5)
WBC: 9.7 10*3/uL (ref 4.0–10.5)
nRBC: 0 % (ref 0.0–0.2)

## 2019-03-22 LAB — PROTIME-INR
INR: 3.8 — ABNORMAL HIGH (ref 0.8–1.2)
Prothrombin Time: 37.6 seconds — ABNORMAL HIGH (ref 11.4–15.2)

## 2019-03-22 LAB — MAGNESIUM: Magnesium: 1.9 mg/dL (ref 1.7–2.4)

## 2019-03-22 MED ORDER — POTASSIUM CHLORIDE CRYS ER 20 MEQ PO TBCR
20.0000 meq | EXTENDED_RELEASE_TABLET | Freq: Two times a day (BID) | ORAL | Status: DC
  Administered 2019-03-22 – 2019-03-23 (×3): 20 meq via ORAL
  Filled 2019-03-22 (×3): qty 1

## 2019-03-22 MED ORDER — SODIUM CHLORIDE 0.9% FLUSH
3.0000 mL | Freq: Two times a day (BID) | INTRAVENOUS | Status: DC
  Administered 2019-03-22 – 2019-03-27 (×6): 3 mL via INTRAVENOUS

## 2019-03-22 NOTE — Evaluation (Signed)
Physical Therapy Evaluation Patient Details Name: Gregory Werner MRN: [id number] DOB: 08-01-1924 Today's Date: 03/22/2019   History of Present Illness  85 yo male who looks younger than his stated age was admitted with CHF causing SOB after having an abnormal EKG in PCP's office.  Noted cardiomegaly and vascular congestion, had elevated liver enzymes, and EF was noted at 30-35%.  Pt is Covid (-).   PMHx:  prostate CA, atherosclerosis, CHF, HTN, L and R BBB, a-fib,   Clinical Impression  Pt was seen for evaluation of movement and is motivated to work and get home.  Reports his wife is very capable of assisting him, and that he is still quite active typically.  Retired Corporate treasurer professor from [employer], very much mentally and physically active until more recently with his bladder issues.  Follow along acutely for safety and endurance with all gait, and to work on reduction of assistance needed for any movement, including stairs.    Follow Up Recommendations Home health PT;Supervision for mobility/OOB;Supervision/Assistance - 24 hour    Equipment Recommendations  Rolling walker with 5" wheels    Recommendations for Other Services       Precautions / Restrictions Precautions Precautions: Fall Precaution Comments: has leaking cath at times Restrictions Weight Bearing Restrictions: No      Mobility  Bed Mobility Overal bed mobility: Needs Assistance Bed Mobility: Supine to Sit;Sit to Supine     Supine to sit: Min assist Sit to supine: Min assist   General bed mobility comments: used bedrail and assisted with support of trunk to sit up from sidelying   Transfers Overall transfer level: Needs assistance Equipment used: Rolling walker (2 wheeled);1 person hand held assist Transfers: Sit to/from Stand Sit to Stand: Min assist;Mod assist         General transfer comment: min assist with higher surface and mod from lower surface  Ambulation/Gait Ambulation/Gait assistance: Min  guard Gait Distance (Feet): 30 Feet Assistive device: Rolling walker (2 wheeled);1 person hand held assist Gait Pattern/deviations: Step-through pattern;Step-to pattern;Decreased stride length;Drifts right/left;Wide base of support Gait velocity: reduced   General Gait Details: cued direction for safety to set up and sit, mod assist to lower to bed x 3  Stairs            Wheelchair Mobility    Modified Rankin (Stroke Patients Only)       Balance Overall balance assessment: Needs assistance Sitting-balance support: Feet supported Sitting balance-Leahy Scale: Fair     Standing balance support: Bilateral upper extremity supported;During functional activity Standing balance-Leahy Scale: Poor                               Pertinent Vitals/Pain Pain Assessment: No/denies pain    Home Living Family/patient expects to be discharged to:: Private residence Living Arrangements: Spouse/significant other Available Help at Discharge: Family;Available 24 hours/day Type of Home: House Home Access: Stairs to enter Entrance Stairs-Rails: Right;Left;Can reach both Entrance Stairs-Number of Steps: 5 Home Layout: Two level;Other (Comment)(split level as well) Home Equipment: Cane - single point;Walker - 2 wheels Additional Comments: has been home wiht mod I gait and no falls per pt    Prior Function Level of Independence: Independent with assistive device(s)               Hand Dominance   Dominant Hand: Right    Extremity/Trunk Assessment   Upper Extremity Assessment Upper Extremity Assessment: LUE deficits/detail LUE Deficits /  Details: general weakness to assist standing LUE Coordination: decreased gross motor    Lower Extremity Assessment Lower Extremity Assessment: Generalized weakness    Cervical / Trunk Assessment Cervical / Trunk Assessment: Kyphotic  Communication   Communication: No difficulties  Cognition Arousal/Alertness:  Awake/alert Behavior During Therapy: WFL for tasks assessed/performed Overall Cognitive Status: Within Functional Limits for tasks assessed                                 General Comments: pt stated his wife would be able to help care for him at home      General Comments General comments (skin integrity, edema, etc.): pt is requiring the use of walker for support, must have assist with safety on walker to choose fastest path and to manage limited support of LUE    Exercises     Assessment/Plan    PT Assessment Patient needs continued PT services  PT Problem List Decreased strength;Decreased range of motion;Decreased activity tolerance;Decreased balance;Decreased mobility;Decreased coordination;Decreased safety awareness;Cardiopulmonary status limiting activity       PT Treatment Interventions DME instruction;Gait training;Stair training;Functional mobility training;Therapeutic activities;Therapeutic exercise;Balance training;Neuromuscular re-education;Patient/family education    PT Goals (Current goals can be found in the Care Plan section)  Acute Rehab PT Goals Patient Stated Goal: to get home and continue with recovery PT Goal Formulation: With patient Time For Goal Achievement: 04/05/19 Potential to Achieve Goals: Good    Frequency Min 3X/week   Barriers to discharge Inaccessible home environment 5 stairs to enter house    Co-evaluation               AM-PAC PT "6 Clicks" Mobility  Outcome Measure Help needed turning from your back to your side while in a flat bed without using bedrails?: A Little Help needed moving from lying on your back to sitting on the side of a flat bed without using bedrails?: A Little Help needed moving to and from a bed to a chair (including a wheelchair)?: A Lot Help needed standing up from a chair using your arms (e.g., wheelchair or bedside chair)?: A Lot Help needed to walk in hospital room?: A Little Help needed  climbing 3-5 steps with a railing? : A Lot 6 Click Score: 15    End of Session Equipment Utilized During Treatment: Gait belt Activity Tolerance: Patient limited by fatigue;Treatment limited secondary to medical complications (Comment) Patient left: in bed;with call bell/phone within reach;with bed alarm set Nurse Communication: Mobility status PT Visit Diagnosis: Unsteadiness on feet (R26.81);Muscle weakness (generalized) (M62.81);Difficulty in walking, not elsewhere classified (R26.2)    Time: 1335-1401 PT Time Calculation (min) (ACUTE ONLY): 26 min   Charges:   PT Evaluation $PT Eval Moderate Complexity: 1 Mod PT Treatments $Gait Training: 8-22 mins       Ramond Dial 03/22/2019, 5:02 PM   Mee Hives, PT MS Acute Rehab Dept. Number: [place]

## 2019-03-22 NOTE — Progress Notes (Signed)
[facility] for warfarin Indication: atrial fibrillation  Allergies  Allergen Reactions  . Cephalexin Diarrhea    Patient Measurements: Height: 5\' 10"  (177.8 cm) Weight: 147 lb (66.7 kg) IBW/kg (Calculated) : 73 Heparin Dosing Weight: 72.8 kg   Vital Signs: Temp: 97.5 F (36.4 C) (12/18 0830) Temp Source: Oral (12/18 0830) BP: 132/78 (12/18 0830) Pulse Rate: 51 (12/18 0830)  Labs: Recent Labs    03/20/19 2210 03/20/19 2315 03/21/19 0200 03/21/19 0351 03/21/19 1157 03/22/19 0410  HGB 14.0  --   --  13.3  --  12.7*  HCT 41.8  --   --  40.9  --  38.5*  PLT 280  --   --  281  --  254  LABPROT  --  37.4*  --  38.2*  --  37.6*  INR  --  3.8*  --  3.9*  --  3.8*  CREATININE 1.14  --   --   --  1.14 1.08  TROPONINIHS  --   --  103* 100*  --   --     Estimated Creatinine Clearance: 41.2 mL/min (by C-G formula based on SCr of 1.08 mg/dL).   Medical History: Past Medical History:  Diagnosis Date  . Arthritis   . Atrial fibrillation ([place])   . Diverticulosis    history  . Dysphagia 2001   history s/p esophageal dilatation  . Dysrhythmia    hx. Atrial Fibrillation, RBBB  . GERD (gastroesophageal reflux disease)   . Hearing loss    bilateral hearing aids  . Hyperlipidemia   . Hypertension    benign  . Lung infiltrate 05/2010   left lower lobe infiltrate, questionable pneumonia  . Prostate cancer ([place]) 2001   treated with radiation    Medications:  Scheduled:  . furosemide  20 mg Intravenous BID  . potassium chloride  20 mEq Oral BID  . sodium chloride flush  3 mL Intravenous Q12H    Assessment: 10 yom presenting with acute heart failure exacerbation. On warfarin PTA for hx Afib - PTA regimen 5 mg daily except 2.5 mg MWF (LD 12/16 at 0700).   INR on admit 3.8 - INR 3.8 again this morning. CBC. No overt bleeding or complications noted.  Plan for INR once INR<2.   Goal of Therapy:  INR 2-3 Monitor platelets by  anticoagulation protocol: Yes   Plan:  Start heparin infusion once INR<2 - monitor INR 12/19 Continue to hold warfarin today Monitor INR, CBC, and for s/sx of bleeding  Marguerite Olea, [facility] Clinical Pharmacist Phone [phone]  03/22/2019 9:17 AM

## 2019-03-22 NOTE — Progress Notes (Addendum)
Progress Note  Patient Name: Gregory Werner Date of Encounter: 03/22/2019  Primary Cardiologist: Minus Breeding, MD   Subjective   Patient is sleepy during exam. Says breathing is a little better. On 2L O2. No chest pain.  Inpatient Medications    Scheduled Meds: . furosemide  20 mg Intravenous BID  . sodium chloride flush  3 mL Intravenous Q12H   Continuous Infusions: . sodium chloride     PRN Meds: sodium chloride, acetaminophen, sodium chloride flush   Vital Signs    Vitals:   03/22/19 0101 03/22/19 0338 03/22/19 0344 03/22/19 0657  BP: 137/76 139/66    Pulse:  69    Resp: 15 20    Temp: 98.8 F (37.1 C) 97.7 F (36.5 C)    TempSrc: Oral Oral    SpO2: 99% 93%    Weight:   65.7 kg 66.7 kg  Height:        Intake/Output Summary (Last 24 hours) at 03/22/2019 0753 Last data filed at 03/22/2019 0600 Gross per 24 hour  Intake 480 ml  Output 850 ml  Net -370 ml   Last 3 Weights 03/22/2019 03/22/2019 03/20/2019  Weight (lbs) 147 lb 144 lb 13.5 oz 160 lb 7.9 oz  Weight (kg) 66.679 kg 65.7 kg 72.8 kg      Telemetry    Afib, HR 60-70s, PVCs, 7 beats Vtach - Personally Reviewed  ECG    No new - Personally Reviewed  Physical Exam   GEN: No acute distress.   Neck: + JVD Cardiac: RRR, no murmurs, rubs, or gallops.  Respiratory: mild crackles at bases bilaterally; 2L O2 GI: Soft, nontender, non-distended  MS: No edema; No deformity. Neuro:  Nonfocal  Psych: Normal affect   Labs    High Sensitivity Troponin:   Recent Labs  Lab 03/21/19 0200 03/21/19 0351  TROPONINIHS 103* 100*      Chemistry Recent Labs  Lab 03/20/19 2210 03/21/19 0351 03/21/19 1157 03/22/19 0410  NA 135  --  136 138  K 5.2*  --  4.1 3.5  CL 103  --  98 99  CO2 20*  --  25 27  GLUCOSE 137*  --  164* 101*  BUN 45*  --  36* 35*  CREATININE 1.14  --  1.14 1.08  CALCIUM 9.3  --  8.7* 8.5*  PROT 6.4* 5.8*  --  5.2*  ALBUMIN 3.4* 3.1*  --  2.7*  AST 88* 78*  --  57*  ALT  104* 99*  --  75*  ALKPHOS 135* 127*  --  111  BILITOT 0.9 1.4*  --  1.2  GFRNONAA 56*  --  56* 59*  GFRAA >60  --  >60 >60  ANIONGAP 12  --  13 12     Hematology Recent Labs  Lab 03/20/19 2210 03/21/19 0351 03/22/19 0410  WBC 11.5* 11.1* 9.7  RBC 4.35 4.27 3.98*  HGB 14.0 13.3 12.7*  HCT 41.8 40.9 38.5*  MCV 96.1 95.8 96.7  MCH 32.2 31.1 31.9  MCHC 33.5 32.5 33.0  RDW 13.1 13.3 13.1  PLT 280 281 254    BNP Recent Labs  Lab 03/20/19 2226  BNP 3,183.6*     DDimer No results for input(s): DDIMER in the last 168 hours.   Radiology    DG Chest 2 View  Result Date: 03/20/2019 CLINICAL DATA:  Weakness over the last 2 days. EXAM: CHEST - 2 VIEW COMPARISON:  08/09/2013 FINDINGS: Cardiomegaly. Aortic atherosclerosis. Abnormal  interstitial lung markings, most consistent with fluid overload/congestive heart failure. Cannot rule out the possibility of viral pneumonia. No dense consolidation or lobar collapse. Minimal blunting of the costophrenic angles but no large effusion. IMPRESSION: Cardiomegaly. Probable mild pulmonary edema/congestive heart failure. Cannot rule out the possibility of viral pneumonia. Electronically Signed   By: Nelson Chimes M.D.   On: 03/20/2019 13:54   DG Chest Port 1 View  Result Date: 03/20/2019 CLINICAL DATA:  Shortness of breath EXAM: PORTABLE CHEST 1 VIEW COMPARISON:  03/20/2019 FINDINGS: Cardiomegaly, vascular congestion. Mild interstitial prominence could reflect interstitial edema. No effusions or acute bony abnormality. IMPRESSION: Cardiomegaly with vascular congestion and possible mild interstitial edema. Findings similar to prior study. Electronically Signed   By: Rolm Baptise M.D.   On: 03/20/2019 21:32   ECHOCARDIOGRAM COMPLETE  Result Date: 03/21/2019   ECHOCARDIOGRAM REPORT   Patient Name:   Gregory Werner Colan Date of Exam: 03/21/2019 Medical Rec #:  [id number]     Height:       70.0 in Accession #:    [id number]    Weight:       160.5 lb Date of  Birth:  07/31/1924    BSA:          1.90 m Patient Age:    51 years      BP:           145/62 mmHg Patient Gender: M             HR:           61 bpm. Exam Location:  Inpatient Procedure: 2D Echo Indications:    CHF-Acute Diastolic [id number] / [id number]  History:        Patient has prior history of Echocardiogram examinations, most                 recent 03/13/2015. Arrythmias:Atrial Fibrillation; Risk                 Factors:Hypertension and Dyslipidemia. History of prostate                 cancer, GERD.  Sonographer:    Darlina Sicilian RDCS Referring Phys: [id number] [place]  1. Left ventricular ejection fraction, by visual estimation, is 30 to 35%. The left ventricle has severely decreased function. Left ventricular septal wall thickness was severely increased. Mildly increased left ventricular posterior wall thickness. There is mildly increased left ventricular hypertrophy.  2. Abnormal septal motion consistent with left bundle branch block.  3. Left ventricular diastolic function could not be evaluated.  4. The left ventricle demonstrates global hypokinesis.  5. Global right ventricle has moderately reduced systolic function.The right ventricular size is normal. No increase in right ventricular wall thickness.  6. Left atrial size was severely dilated.  7. Right atrial size was not assessed.  8. The mitral valve is abnormal. Trivial mitral valve regurgitation.  9. The tricuspid valve is grossly normal. Tricuspid valve regurgitation is mild. 10. The aortic valve is tricuspid. Aortic valve regurgitation is mild. Mild aortic valve sclerosis without stenosis. 11. The pulmonic valve was grossly normal. Pulmonic valve regurgitation is trivial. 12. Aortic dilatation noted. 13. There is mild dilatation of the ascending aorta and of the aortic root measuring 40 mm. 14. Moderately elevated pulmonary artery systolic pressure. 15. The tricuspid regurgitant velocity is 3.40 m/s, and with an assumed right atrial  pressure of 15 mmHg, the estimated right ventricular systolic pressure is moderately elevated at 61.2 mmHg. 16.  The inferior vena cava is dilated in size with <50% respiratory variability, suggesting right atrial pressure of 15 mmHg. FINDINGS  Left Ventricle: Left ventricular ejection fraction, by visual estimation, is 30 to 35%. The left ventricle has severely decreased function. The left ventricle demonstrates global hypokinesis. Mildly increased left ventricular posterior wall thickness. There is mildly increased left ventricular hypertrophy. Abnormal (paradoxical) septal motion, consistent with left bundle branch block. The left ventricular diastology could not be evaluated due to atrial fibrillation. Left ventricular diastolic function  could not be evaluated. Right Ventricle: The right ventricular size is normal. No increase in right ventricular wall thickness. Global RV systolic function is has moderately reduced systolic function. The tricuspid regurgitant velocity is 3.40 m/s, and with an assumed right atrial pressure of 15 mmHg, the estimated right ventricular systolic pressure is moderately elevated at 61.2 mmHg. Left Atrium: Left atrial size was severely dilated. Right Atrium: Right atrial size was not assessed Pericardium: There is no evidence of pericardial effusion. Mitral Valve: The mitral valve is abnormal. There is mild thickening of the mitral valve leaflet(s). Trivial mitral valve regurgitation. Tricuspid Valve: The tricuspid valve is grossly normal. Tricuspid valve regurgitation is mild. Aortic Valve: The aortic valve is tricuspid. Aortic valve regurgitation is mild. Mild aortic valve sclerosis is present, with no evidence of aortic valve stenosis. Pulmonic Valve: The pulmonic valve was grossly normal. Pulmonic valve regurgitation is trivial. Pulmonic regurgitation is trivial. Aorta: Aortic dilatation noted. There is mild dilatation of the ascending aorta and of the aortic root measuring 40 mm.  Venous: The inferior vena cava is dilated in size with less than 50% respiratory variability, suggesting right atrial pressure of 15 mmHg. IAS/Shunts: No atrial level shunt detected by color flow Doppler.  LEFT VENTRICLE PLAX 2D LVIDd:         4.30 cm LVIDs:         3.80 cm LV PW:         1.20 cm LV IVS:        2.00 cm LVOT diam:     1.70 cm LV SV:         21 ml LV SV Index:   11.12 LVOT Area:     2.27 cm  LV Volumes (MOD) LV area d, A2C:    25.70 cm LV area d, A4C:    34.90 cm LV area s, A2C:    23.80 cm LV area s, A4C:    27.50 cm LV major d, A2C:   7.37 cm LV major d, A4C:   8.09 cm LV major s, A2C:   7.18 cm LV major s, A4C:   7.86 cm LV vol d, MOD A2C: 77.0 ml LV vol d, MOD A4C: 124.0 ml LV vol s, MOD A2C: 66.4 ml LV vol s, MOD A4C: 80.8 ml LV SV MOD A2C:     10.6 ml LV SV MOD A4C:     124.0 ml LV SV MOD BP:      25.3 ml RIGHT VENTRICLE RV S prime:     6.81 cm/s TAPSE (M-mode): 1.6 cm LEFT ATRIUM              Index LA diam:        4.20 cm  2.21 cm/m LA Vol (A2C):   104.6 ml 55.00 ml/m LA Vol (A4C):   121.0 ml 63.65 ml/m LA Biplane Vol: 112.0 ml 58.92 ml/m  AORTIC VALVE LVOT Vmax:   113.00 cm/s LVOT Vmean:  66.400 cm/s LVOT VTI:  0.154 m  AORTA Ao Root diam: 4.20 cm Ao Asc diam:  3.95 cm MITRAL VALVE                       TRICUSPID VALVE MV Area (PHT): 6.71 cm            TR Peak grad:   46.2 mmHg MV PHT:        32.77 msec          TR Vmax:        359.00 cm/s MV Decel Time: 113 msec MV E velocity: 73.25 cm/s 103 cm/s SHUNTS                                    Systemic VTI:  0.15 m                                    Systemic Diam: 1.70 cm  Lyman Bishop MD Electronically signed by Lyman Bishop MD Signature Date/Time: 03/21/2019/10:02:12 AM    Final    US Abdomen Limited RUQ  Result Date: 03/21/2019 CLINICAL DATA:  Elevated liver function tests EXAM: ULTRASOUND ABDOMEN LIMITED RIGHT UPPER QUADRANT COMPARISON:  None. FINDINGS: Gallbladder: No gallstones or wall thickening visualized. No sonographic  Murphy sign noted by sonographer. Common bile duct: Diameter: 4 mm Liver: 16 mm left hepatic cyst. 24 mm right hepatic cyst. No evidence of solid mass. Within normal limits in parenchymal echogenicity. Portal vein is patent on color Doppler imaging with normal direction of blood flow towards the liver. IMPRESSION: 1. No explanation for the history.  No acute finding. 2. Small, incidental hepatic cysts. Electronically Signed   By: Monte Fantasia M.D.   On: 03/21/2019 04:15    Cardiac Studies   Echo  1. Left ventricular ejection fraction, by visual estimation, is 30 to 35%. The left ventricle has severely decreased function. Left ventricular septal wall thickness was severely increased. Mildly increased left ventricular posterior wall thickness.  There is mildly increased left ventricular hypertrophy. 2. Abnormal septal motion consistent with left bundle branch block. 3. Left ventricular diastolic function could not be evaluated. 4. The left ventricle demonstrates global hypokinesis. 5. Global right ventricle has moderately reduced systolic function.The right ventricular size is normal. No increase in right ventricular wall thickness. 6. Left atrial size was severely dilated. 7. Right atrial size was not assessed. 8. The mitral valve is abnormal. Trivial mitral valve regurgitation. 9. The tricuspid valve is grossly normal. Tricuspid valve regurgitation is mild. 10. The aortic valve is tricuspid. Aortic valve regurgitation is mild. Mild aortic valve sclerosis without stenosis. 11. The pulmonic valve was grossly normal. Pulmonic valve regurgitation is trivial. 12. Aortic dilatation noted. 13. There is mild dilatation of the ascending aorta and of the aortic root measuring 40 mm. 14. Moderately elevated pulmonary artery systolic pressure. 15. The tricuspid regurgitant velocity is 3.40 m/s, and with an assumed right atrial pressure of 15 mmHg, the estimated right ventricular systolic pressure  is moderately elevated at 61.2 mmHg. 16. The inferior vena cava is dilated in size with <50% respiratory variability, suggesting right atrial pressure of 15 mmHg.  Patient Profile     85 y.o. male  with a hx of permanent Afib on coumadin, HTN, HLD, prostate cancer who is being seen today for the evaluation of new onset  Heart failure   Assessment & Plan    New onset Acute Systolic Heart Failure Patient presented with cough and sob for the last couple days. BNP elevated to 3,000. CXR significant for cardiomegaly with vascular congestion and mild interstitial edema. Given IV lasix 20mg  in the ED. Echo in 2016 showed preserved EF. - Started on IV lasix 20 mg BID - Breathing has improved but patient is on 2L O2 (not on O2 at baseline) - creatinine is 1.14 > 1.08 - potassium 3.5>> will start daily potassium - I/Os not likely complete. -370 mL in the last 24 hours - Weights are not accurate - Continue diuresis - Echo this admission with new low EF 30-35% - Spoke to the patient about cardiac catheterization. He is still unsure if he would like to proceed with it given age and risks. We have a couple days before INR is stable cath and breathing is better and he is able to lay flat. INR 3.8 today  New Cardiomyopathy - EF down to 30-35% - Patient has not had an ischemic evaluation - Denies history of chest pain - Possible plan for cardiac cath as above for ischemic evaluation  Afib  - H/o of permanent afib on coumadin. Not on rate control medication at baseline - Has been historically asymptomatic in afib - Rates 60-70s - Hold coumadin - INR 3.8. Heparin per pharmacy when INR <2  LBBB - Appears to be new - iRBBB on previous EKGs - Plan as above  HTN - On ramipril at home>> held for diuresis/renal function - Pressures reasonable  HLD - No recent labs in the system. Not on medication - LDL 104  Elevated LFTs - AST and ALT moderately elevated - Patient reported 1 beer daily  for the last 10 years - US abdomen ordered   For questions or updates, please contact [place] Please consult [email or web address] for contact info under        Signed, Cadence Ninfa Meeker, PA-C  03/22/2019, 7:53 AM    ---------------------------------------------------------------------------------------------   History and all data above reviewed.  Patient examined.  I agree with the findings as above.  [facility] G Sami feels well today, and slightly better than yesterday. Continues to diurese.  Constitutional: No acute distress Eyes: pupils equally round and reactive to light, sclera non-icteric, normal conjunctiva and lids ENMT: normal dentition, moist mucous membranes Cardiovascular: irregular rhythm, bradycardic, no murmurs. S1 and S2 normal. Radial pulses normal bilaterally. jugular venous distention to the upper 1/3 of the neck, normal appearing V wave.  Respiratory: bibasilar crackles.  GI : normal bowel sounds, soft and nontender. No distention.   MSK: extremities warm, well perfused. No edema.  NEURO: grossly nonfocal exam, moves all extremities. PSYCH: alert and oriented x 3, normal mood and affect.   All available labs, radiology testing, previous records reviewed. Agree with documented assessment and plan of my colleague as stated above with the following additions or changes:  Principal Problem:   CHF (congestive heart failure) (HCC) Active Problems:   HYPERTENSION, BENIGN   Atrial fibrillation (HCC)   LBBB (left bundle branch block)   Elevated LFTs    Plan: I had a lengthy discussion with Mr. Fagot and his wife today regarding evaluation and management of newly recognized reduced systolic function with heart failure.  His wife tells me that since Sunday he has been noticeably more short of breath and has had significant fatigue completing his activities of daily living and leisure activities.  We discussed that he  has not had any chest pain.  It is unclear if his  reduced ejection fraction is related to ischemia or nonischemic.  We have discussed coronary angiography but will need his INR to normalize to proceed with cardiac cath.  There is no prior cross-sectional chest imaging for me to review looking for coronary calcifications.  Troponin is mildly elevated and flat at 100, BNP elevated to 3100.  With a new left bundle branch block, it would be reasonable to exclude ischemia we will anticipate coronary angiography on Monday.  He is rate controlled in afib with ectopy.   INR 3.8 today, continue to monitor.   Time Spent Directly with Patient:  I have spent a total of 45 minutes with the patient reviewing hospital notes, telemetry, EKGs, labs and examining the patient as well as establishing an assessment and plan that was discussed personally with the patient.  > 50% of time was spent in direct patient care.  Length of Stay:  LOS: 1 day   Elouise Munroe, MD [facility] 1:48 PM  03/22/2019

## 2019-03-22 NOTE — Progress Notes (Addendum)
Patient refused standing weight at this time.  Bed weight completed.  Will attempt later in the morning or notify day shift nurse.   0657 standing weight completed

## 2019-03-22 NOTE — Progress Notes (Signed)
PROGRESS NOTE    Gregory Werner  [id number] DOB: 07/16/1924 DOA: 03/20/2019 PCP: [name], DO     Brief Narrative:  Gregory Werner is a 85 y.o. male with medical history significant of A. fib, hypertension, hyperlipidemia, prostate cancer treated with radiation being sent to the ED by his PCP for evaluation of an abnormal EKG. Patient states he saw his PCP yesterday and was advised to come into the ED as his EKG appeared abnormal.  He has been experiencing shortness of breath, orthopnea, and a cough for the past few days.  Denies chest pain. Work up in the ED was significant for BNP 3183, Chest x-ray showing cardiomegaly with vascular congestion and possible mild interstitial edema. Patient received IV Lasix 20 mg.  He was admitted for new onset CHF as well as new left bundle branch block on EKG.  Cardiology was consulted.  New events last 24 hours / Subjective: States that he is just waking up this morning, no acute complaints.  Denies any chest pain this morning.  Assessment & Plan:   Principal Problem:   CHF (congestive heart failure) (HCC) Active Problems:   HYPERTENSION, BENIGN   Atrial fibrillation (HCC)   LBBB (left bundle branch block)   Elevated LFTs   New onset acute systolic heart failure -Admitted with chief complaint of dyspnea, orthopnea.  BNP 3183 on admission as well as chest x-ray showing cardiomegaly with vascular congestion and possible mild interstitial edema -IV Lasix -Strict I's and O's, daily weight (these have not been measured accurately) -Echocardiogram EF 30 to 35% -Cardiology following, consideration for heart cath at some point  Permanent atrial fibrillation -On Coumadin as an outpatient, hold due to supratherapeutic INR -IV heparin once INR < 2  LBBB -Cardiology following, consideration for heart cath at some point  Essential hypertension -Hold ramipril -Lasix   Elevated liver enzyme -Right upper quadrant ultrasound  unremarkable -Improving   DVT prophylaxis: On Coumadin as an outpatient Code Status: DNR Family Communication: No family at bedside Disposition Plan: Pending further clinical improvement, cardiology plan for heart cath at some point   Consultants:   Cardiology  Procedures:   None  Antimicrobials:  Anti-infectives (From admission, onward)   None        Objective: Vitals:   03/22/19 0657 03/22/19 0830 03/22/19 1012 03/22/19 1219  BP:  132/78  134/85  Pulse:  (!) 51  71  Resp:  18  18  Temp:  (!) 97.5 F (36.4 C)  98.6 F (37 C)  TempSrc:  Oral  Oral  SpO2:  97% 96% 91%  Weight: 66.7 kg     Height:        Intake/Output Summary (Last 24 hours) at 03/22/2019 1249 Last data filed at 03/22/2019 [id number] Gross per 24 hour  Intake 720 ml  Output 850 ml  Net -130 ml   Filed Weights   03/20/19 2315 03/22/19 0344 03/22/19 0657  Weight: 72.8 kg 65.7 kg 66.7 kg    Examination:  General exam: Appears calm and comfortable  Respiratory system: Clear to auscultation. Respiratory effort normal. No respiratory distress. No conversational dyspnea.  Cardiovascular system: S1 & S2 heard. No murmurs. No pedal edema. Gastrointestinal system: Abdomen is nondistended, soft and nontender. Normal bowel sounds heard. Central nervous system: Alert and oriented. No focal neurological deficits. Speech clear.  Extremities: Symmetric in appearance  Skin: No rashes, lesions or ulcers on exposed skin  Psychiatry: Judgement and insight appear normal. Mood & affect appropriate.  Data Reviewed: I have personally reviewed following labs and imaging studies  CBC: Recent Labs  Lab 03/20/19 2210 03/21/19 0351 03/22/19 0410  WBC 11.5* 11.1* 9.7  NEUTROABS 9.3*  --   --   HGB 14.0 13.3 12.7*  HCT 41.8 40.9 38.5*  MCV 96.1 95.8 96.7  PLT 280 281 0000000   Basic Metabolic Panel: Recent Labs  Lab 03/20/19 2210 03/21/19 1157 03/22/19 0410 03/22/19 1118  NA 135 136 138  --   K 5.2* 4.1  3.5  --   CL 103 98 99  --   CO2 20* 25 27  --   GLUCOSE 137* 164* 101*  --   BUN 45* 36* 35*  --   CREATININE 1.14 1.14 1.08  --   CALCIUM 9.3 8.7* 8.5*  --   MG  --   --   --  1.9   GFR: Estimated Creatinine Clearance: 41.2 mL/min (by C-G formula based on SCr of 1.08 mg/dL). Liver Function Tests: Recent Labs  Lab 03/20/19 2210 03/21/19 0351 03/22/19 0410  AST 88* 78* 57*  ALT 104* 99* 75*  ALKPHOS 135* 127* 111  BILITOT 0.9 1.4* 1.2  PROT 6.4* 5.8* 5.2*  ALBUMIN 3.4* 3.1* 2.7*   No results for input(s): LIPASE, AMYLASE in the last 168 hours. No results for input(s): AMMONIA in the last 168 hours. Coagulation Profile: Recent Labs  Lab 03/20/19 2315 03/21/19 0351 03/22/19 0410  INR 3.8* 3.9* 3.8*   Cardiac Enzymes: No results for input(s): CKTOTAL, CKMB, CKMBINDEX, TROPONINI in the last 168 hours. BNP (last 3 results) No results for input(s): PROBNP in the last 8760 hours. HbA1C: No results for input(s): HGBA1C in the last 72 hours. CBG: No results for input(s): GLUCAP in the last 168 hours. Lipid Profile: No results for input(s): CHOL, HDL, LDLCALC, TRIG, CHOLHDL, LDLDIRECT in the last 72 hours. Thyroid Function Tests: No results for input(s): TSH, T4TOTAL, FREET4, T3FREE, THYROIDAB in the last 72 hours. Anemia Panel: No results for input(s): VITAMINB12, FOLATE, FERRITIN, TIBC, IRON, RETICCTPCT in the last 72 hours. Sepsis Labs: No results for input(s): PROCALCITON, LATICACIDVEN in the last 168 hours.  Recent Results (from the past 240 hour(s))  SARS CORONAVIRUS 2 (TAT 6-24 HRS) Nasopharyngeal Nasopharyngeal Swab     Status: None   Collection Time: 03/21/19  4:34 AM   Specimen: Nasopharyngeal Swab  Result Value Ref Range Status   SARS Coronavirus 2 NEGATIVE NEGATIVE Final    Comment: (NOTE) SARS-CoV-2 target nucleic acids are NOT DETECTED. The SARS-CoV-2 RNA is generally detectable in upper and lower respiratory specimens during the acute phase of  infection. Negative results do not preclude SARS-CoV-2 infection, do not rule out co-infections with other pathogens, and should not be used as the sole basis for treatment or other patient management decisions. Negative results must be combined with clinical observations, patient history, and epidemiological information. The expected result is Negative. Fact Sheet for Patients: SugarRoll.be Fact Sheet for Healthcare Providers: https://www.woods-mathews.com/ This test is not yet approved or cleared by the [place] FDA and  has been authorized for detection and/or diagnosis of SARS-CoV-2 by FDA under an Emergency Use Authorization (EUA). This EUA will remain  in effect (meaning this test can be used) for the duration of the COVID-19 declaration under Section 56 4(b)(1) of the Act, 21 U.S.C. section 360bbb-3(b)(1), unless the authorization is terminated or revoked sooner. Performed at [facility], [place] [address], [place], [place] [address]  Radiology Studies: DG Chest 2 View  Result Date: 03/20/2019 CLINICAL DATA:  Weakness over the last 2 days. EXAM: CHEST - 2 VIEW COMPARISON:  08/09/2013 FINDINGS: Cardiomegaly. Aortic atherosclerosis. Abnormal interstitial lung markings, most consistent with fluid overload/congestive heart failure. Cannot rule out the possibility of viral pneumonia. No dense consolidation or lobar collapse. Minimal blunting of the costophrenic angles but no large effusion. IMPRESSION: Cardiomegaly. Probable mild pulmonary edema/congestive heart failure. Cannot rule out the possibility of viral pneumonia. Electronically Signed   By: Nelson Chimes M.D.   On: 03/20/2019 13:54   DG Chest Port 1 View  Result Date: 03/20/2019 CLINICAL DATA:  Shortness of breath EXAM: PORTABLE CHEST 1 VIEW COMPARISON:  03/20/2019 FINDINGS: Cardiomegaly, vascular congestion. Mild interstitial prominence could reflect interstitial edema.  No effusions or acute bony abnormality. IMPRESSION: Cardiomegaly with vascular congestion and possible mild interstitial edema. Findings similar to prior study. Electronically Signed   By: Rolm Baptise M.D.   On: 03/20/2019 21:32   ECHOCARDIOGRAM COMPLETE  Result Date: 03/21/2019   ECHOCARDIOGRAM REPORT   Patient Name:   PHIL KOZAR Santino Date of Exam: 03/21/2019 Medical Rec #:  [id number]     Height:       70.0 in Accession #:    [id number]    Weight:       160.5 lb Date of Birth:  28-Jun-1924    BSA:          1.90 m Patient Age:    48 years      BP:           145/62 mmHg Patient Gender: M             HR:           61 bpm. Exam Location:  Inpatient Procedure: 2D Echo Indications:    CHF-Acute Diastolic [id number] / [id number]  History:        Patient has prior history of Echocardiogram examinations, most                 recent 03/13/2015. Arrythmias:Atrial Fibrillation; Risk                 Factors:Hypertension and Dyslipidemia. History of prostate                 cancer, GERD.  Sonographer:    Darlina Sicilian RDCS Referring Phys: [id number] [place]  1. Left ventricular ejection fraction, by visual estimation, is 30 to 35%. The left ventricle has severely decreased function. Left ventricular septal wall thickness was severely increased. Mildly increased left ventricular posterior wall thickness. There is mildly increased left ventricular hypertrophy.  2. Abnormal septal motion consistent with left bundle branch block.  3. Left ventricular diastolic function could not be evaluated.  4. The left ventricle demonstrates global hypokinesis.  5. Global right ventricle has moderately reduced systolic function.The right ventricular size is normal. No increase in right ventricular wall thickness.  6. Left atrial size was severely dilated.  7. Right atrial size was not assessed.  8. The mitral valve is abnormal. Trivial mitral valve regurgitation.  9. The tricuspid valve is grossly normal. Tricuspid valve  regurgitation is mild. 10. The aortic valve is tricuspid. Aortic valve regurgitation is mild. Mild aortic valve sclerosis without stenosis. 11. The pulmonic valve was grossly normal. Pulmonic valve regurgitation is trivial. 12. Aortic dilatation noted. 13. There is mild dilatation of the ascending aorta and of the aortic root measuring 40 mm. 14. Moderately elevated pulmonary artery systolic  pressure. 15. The tricuspid regurgitant velocity is 3.40 m/s, and with an assumed right atrial pressure of 15 mmHg, the estimated right ventricular systolic pressure is moderately elevated at 61.2 mmHg. 16. The inferior vena cava is dilated in size with <50% respiratory variability, suggesting right atrial pressure of 15 mmHg. FINDINGS  Left Ventricle: Left ventricular ejection fraction, by visual estimation, is 30 to 35%. The left ventricle has severely decreased function. The left ventricle demonstrates global hypokinesis. Mildly increased left ventricular posterior wall thickness. There is mildly increased left ventricular hypertrophy. Abnormal (paradoxical) septal motion, consistent with left bundle branch block. The left ventricular diastology could not be evaluated due to atrial fibrillation. Left ventricular diastolic function  could not be evaluated. Right Ventricle: The right ventricular size is normal. No increase in right ventricular wall thickness. Global RV systolic function is has moderately reduced systolic function. The tricuspid regurgitant velocity is 3.40 m/s, and with an assumed right atrial pressure of 15 mmHg, the estimated right ventricular systolic pressure is moderately elevated at 61.2 mmHg. Left Atrium: Left atrial size was severely dilated. Right Atrium: Right atrial size was not assessed Pericardium: There is no evidence of pericardial effusion. Mitral Valve: The mitral valve is abnormal. There is mild thickening of the mitral valve leaflet(s). Trivial mitral valve regurgitation. Tricuspid Valve: The  tricuspid valve is grossly normal. Tricuspid valve regurgitation is mild. Aortic Valve: The aortic valve is tricuspid. Aortic valve regurgitation is mild. Mild aortic valve sclerosis is present, with no evidence of aortic valve stenosis. Pulmonic Valve: The pulmonic valve was grossly normal. Pulmonic valve regurgitation is trivial. Pulmonic regurgitation is trivial. Aorta: Aortic dilatation noted. There is mild dilatation of the ascending aorta and of the aortic root measuring 40 mm. Venous: The inferior vena cava is dilated in size with less than 50% respiratory variability, suggesting right atrial pressure of 15 mmHg. IAS/Shunts: No atrial level shunt detected by color flow Doppler.  LEFT VENTRICLE PLAX 2D LVIDd:         4.30 cm LVIDs:         3.80 cm LV PW:         1.20 cm LV IVS:        2.00 cm LVOT diam:     1.70 cm LV SV:         21 ml LV SV Index:   11.12 LVOT Area:     2.27 cm  LV Volumes (MOD) LV area d, A2C:    25.70 cm LV area d, A4C:    34.90 cm LV area s, A2C:    23.80 cm LV area s, A4C:    27.50 cm LV major d, A2C:   7.37 cm LV major d, A4C:   8.09 cm LV major s, A2C:   7.18 cm LV major s, A4C:   7.86 cm LV vol d, MOD A2C: 77.0 ml LV vol d, MOD A4C: 124.0 ml LV vol s, MOD A2C: 66.4 ml LV vol s, MOD A4C: 80.8 ml LV SV MOD A2C:     10.6 ml LV SV MOD A4C:     124.0 ml LV SV MOD BP:      25.3 ml RIGHT VENTRICLE RV S prime:     6.81 cm/s TAPSE (M-mode): 1.6 cm LEFT ATRIUM              Index LA diam:        4.20 cm  2.21 cm/m LA Vol (A2C):   104.6 ml 55.00 ml/m LA  Vol (A4C):   121.0 ml 63.65 ml/m LA Biplane Vol: 112.0 ml 58.92 ml/m  AORTIC VALVE LVOT Vmax:   113.00 cm/s LVOT Vmean:  66.400 cm/s LVOT VTI:    0.154 m  AORTA Ao Root diam: 4.20 cm Ao Asc diam:  3.95 cm MITRAL VALVE                       TRICUSPID VALVE MV Area (PHT): 6.71 cm            TR Peak grad:   46.2 mmHg MV PHT:        32.77 msec          TR Vmax:        359.00 cm/s MV Decel Time: 113 msec MV E velocity: 73.25 cm/s 103 cm/s  SHUNTS                                    Systemic VTI:  0.15 m                                    Systemic Diam: 1.70 cm  Lyman Bishop MD Electronically signed by Lyman Bishop MD Signature Date/Time: 03/21/2019/10:02:12 AM    Final    US Abdomen Limited RUQ  Result Date: 03/21/2019 CLINICAL DATA:  Elevated liver function tests EXAM: ULTRASOUND ABDOMEN LIMITED RIGHT UPPER QUADRANT COMPARISON:  None. FINDINGS: Gallbladder: No gallstones or wall thickening visualized. No sonographic Murphy sign noted by sonographer. Common bile duct: Diameter: 4 mm Liver: 16 mm left hepatic cyst. 24 mm right hepatic cyst. No evidence of solid mass. Within normal limits in parenchymal echogenicity. Portal vein is patent on color Doppler imaging with normal direction of blood flow towards the liver. IMPRESSION: 1. No explanation for the history.  No acute finding. 2. Small, incidental hepatic cysts. Electronically Signed   By: Monte Fantasia M.D.   On: 03/21/2019 04:15      Scheduled Meds: . furosemide  20 mg Intravenous BID  . potassium chloride  20 mEq Oral BID  . sodium chloride flush  3 mL Intravenous Q12H   Continuous Infusions: . sodium chloride       LOS: 1 day      Time spent: 35 minutes   Dessa Phi, DO [facility] 03/22/2019, 12:49 PM   Available via Epic secure chat 7am-7pm After these hours, please refer to coverage provider listed on [email or web address]

## 2019-03-23 DIAGNOSIS — I4821 Permanent atrial fibrillation: Secondary | ICD-10-CM

## 2019-03-23 LAB — BASIC METABOLIC PANEL
Anion gap: 13 (ref 5–15)
BUN: 33 mg/dL — ABNORMAL HIGH (ref 8–23)
CO2: 27 mmol/L (ref 22–32)
Calcium: 8.8 mg/dL — ABNORMAL LOW (ref 8.9–10.3)
Chloride: 99 mmol/L (ref 98–111)
Creatinine, Ser: 1.3 mg/dL — ABNORMAL HIGH (ref 0.61–1.24)
GFR calc Af Amer: 55 mL/min — ABNORMAL LOW (ref >60)
GFR calc non Af Amer: 47 mL/min — ABNORMAL LOW (ref >60)
Glucose, Bld: 128 mg/dL — ABNORMAL HIGH (ref 70–99)
Potassium: 3.6 mmol/L (ref 3.5–5.1)
Sodium: 139 mmol/L (ref 135–145)

## 2019-03-23 LAB — HEPATIC FUNCTION PANEL
ALT: 65 U/L — ABNORMAL HIGH (ref 0–44)
AST: 42 U/L — ABNORMAL HIGH (ref 15–41)
Albumin: 2.9 g/dL — ABNORMAL LOW (ref 3.5–5.0)
Alkaline Phosphatase: 112 U/L (ref 38–126)
Bilirubin, Direct: 0.4 mg/dL — ABNORMAL HIGH (ref 0.0–0.2)
Indirect Bilirubin: 0.8 mg/dL (ref 0.3–0.9)
Total Bilirubin: 1.2 mg/dL (ref 0.3–1.2)
Total Protein: 6 g/dL — ABNORMAL LOW (ref 6.5–8.1)

## 2019-03-23 LAB — CBC
HCT: 41.1 % (ref 39.0–52.0)
Hemoglobin: 14 g/dL (ref 13.0–17.0)
MCH: 32 pg (ref 26.0–34.0)
MCHC: 34.1 g/dL (ref 30.0–36.0)
MCV: 93.8 fL (ref 80.0–100.0)
Platelets: 288 10*3/uL (ref 150–400)
RBC: 4.38 MIL/uL (ref 4.22–5.81)
RDW: 13.2 % (ref 11.5–15.5)
WBC: 11.1 10*3/uL — ABNORMAL HIGH (ref 4.0–10.5)
nRBC: 0 % (ref 0.0–0.2)

## 2019-03-23 LAB — PROTIME-INR
INR: 3.3 — ABNORMAL HIGH (ref 0.8–1.2)
Prothrombin Time: 33.3 seconds — ABNORMAL HIGH (ref 11.4–15.2)

## 2019-03-23 MED ORDER — DOCUSATE SODIUM 100 MG PO CAPS
100.0000 mg | ORAL_CAPSULE | Freq: Two times a day (BID) | ORAL | Status: DC | PRN
  Administered 2019-03-23 – 2019-03-25 (×2): 100 mg via ORAL
  Filled 2019-03-23 (×3): qty 1

## 2019-03-23 MED ORDER — SENNA 8.6 MG PO TABS: 1.0000 | ORAL_TABLET | Freq: Every evening | ORAL | Status: DC | PRN

## 2019-03-23 MED ORDER — FUROSEMIDE 10 MG/ML IJ SOLN
20.0000 mg | Freq: Two times a day (BID) | INTRAMUSCULAR | Status: DC
  Administered 2019-03-23 – 2019-03-25 (×5): 20 mg via INTRAVENOUS
  Filled 2019-03-23 (×5): qty 2

## 2019-03-23 NOTE — Progress Notes (Signed)
CSW acknowledges consult for SNF. PT recommends [facility] therefore SNF consult is being closed out..    CSW will continue to follow.

## 2019-03-23 NOTE — Progress Notes (Signed)
[place] for warfarin Indication: atrial fibrillation  Allergies  Allergen Reactions  . Cephalexin Diarrhea    Patient Measurements: Height: 5\' 10"  (177.8 cm) Weight: 142 lb 3.2 oz (64.5 kg)(scale a) IBW/kg (Calculated) : 73 Heparin Dosing Weight: 72.8 kg   Vital Signs: Temp: 99.1 F (37.3 C) (12/19 0439) Temp Source: Oral (12/19 0439) BP: 158/96 (12/19 0439) Pulse Rate: 62 (12/19 0439)  Labs: Recent Labs    03/20/19 2210 03/21/19 0200 03/21/19 0351 03/21/19 1157 03/22/19 0410 03/23/19 0330  HGB   < >  --  13.3  --  12.7* 14.0  HCT  --   --  40.9  --  38.5* 41.1  PLT  --   --  281  --  254 288  LABPROT  --   --  38.2*  --  37.6* 33.3*  INR  --   --  3.9*  --  3.8* 3.3*  CREATININE  --   --   --  1.14 1.08 1.30*  TROPONINIHS  --  103* 100*  --   --   --    < > = values in this interval not displayed.    Estimated Creatinine Clearance: 33.1 mL/min (A) (by C-G formula based on SCr of 1.3 mg/dL (H)).   Medical History: Past Medical History:  Diagnosis Date  . Arthritis   . Atrial fibrillation ([place])   . Diverticulosis    history  . Dysphagia 2001   history s/p esophageal dilatation  . Dysrhythmia    hx. Atrial Fibrillation, RBBB  . GERD (gastroesophageal reflux disease)   . Hearing loss    bilateral hearing aids  . Hyperlipidemia   . Hypertension    benign  . Lung infiltrate 05/2010   left lower lobe infiltrate, questionable pneumonia  . Prostate cancer ([place]) 2001   treated with radiation    Medications:  Scheduled:  . furosemide  20 mg Intravenous BID  . potassium chloride  20 mEq Oral BID  . sodium chloride flush  3 mL Intravenous Q12H  . sodium chloride flush  3 mL Intravenous Q12H    Assessment: 31 yom presenting with acute heart failure exacerbation. On warfarin PTA for hx Afib - PTA regimen 5 mg daily except 2.5 mg MWF (LD 12/16 at 0700). INR on admit 3.8.   Plan for heparin infusion once INR<2.  Pending decision for cath.   INR today is at 3.3 with no warfarin given this admission. H&H has increased since yesterday, today at 14.0/41.1, and plts are wnl. No bleeding noted.   Goal of Therapy:  INR 2-3 Monitor platelets by anticoagulation protocol: Yes   Plan:  Start heparin infusion once INR<2  Continue to hold warfarin today Monitor INR, CBC, and for s/sx of bleeding   Thank you,   Eddie Candle, PharmD PGY-1 Pharmacy Resident   Please check amion for clinical pharmacist contact number

## 2019-03-23 NOTE — Progress Notes (Signed)
PROGRESS NOTE    Gregory Werner  [id number] DOB: 26-Feb-1925 DOA: 03/20/2019 PCP: [name], DO     Brief Narrative:  Gregory Werner is a 85 y.o. male with medical history significant of A. fib, hypertension, hyperlipidemia, prostate cancer treated with radiation being sent to the ED by his PCP for evaluation of an abnormal EKG. Patient states he saw his PCP yesterday and was advised to come into the ED as his EKG appeared abnormal.  He has been experiencing shortness of breath, orthopnea, and a cough for the past few days.  Denies chest pain. Work up in the ED was significant for BNP 3183, Chest x-ray showing cardiomegaly with vascular congestion and possible mild interstitial edema. Patient received IV Lasix 20 mg.  He was admitted for new onset CHF as well as new left bundle branch block on EKG.  Cardiology was consulted.  New events last 24 hours / Subjective: Sitting in bed, eating breakfast.  No complaints of chest pain or worsening shortness of breath this morning.  Assessment & Plan:   Principal Problem:   CHF (congestive heart failure) (HCC) Active Problems:   HYPERTENSION, BENIGN   Atrial fibrillation (HCC)   LBBB (left bundle branch block)   Elevated LFTs   New onset acute systolic heart failure -Admitted with chief complaint of dyspnea, orthopnea.  BNP 3183 on admission as well as chest x-ray showing cardiomegaly with vascular congestion and possible mild interstitial edema -Strict I's and O's, daily weight (these have not been measured accurately) -Echocardiogram EF 30 to 35% -Cardiology following, heart cath planned for Monday -IV Lasix  Permanent atrial fibrillation -On Coumadin as an outpatient, hold due to supratherapeutic INR -IV heparin once INR < 2  LBBB -Cardiology following, heart cath planned for Monday  Essential hypertension -Hold ramipril -Lasix  Elevated liver enzyme -Right upper quadrant ultrasound unremarkable -Trending down   DVT  prophylaxis: On Coumadin as an outpatient Code Status: DNR Family Communication: No family at bedside Disposition Plan: Heart cath planned for Monday   Consultants:   Cardiology  Procedures:   None  Antimicrobials:  Anti-infectives (From admission, onward)   None       Objective: Vitals:   03/22/19 2001 03/23/19 0035 03/23/19 0439 03/23/19 0443  BP: (!) 152/86 (!) 147/98 (!) 158/96   Pulse: 60 67 62   Resp: 19 17 17    Temp: 98.5 F (36.9 C) 99 F (37.2 C) 99.1 F (37.3 C)   TempSrc: Oral Oral Oral   SpO2: 93% 96% 91%   Weight:    64.5 kg  Height:        Intake/Output Summary (Last 24 hours) at 03/23/2019 0949 Last data filed at 03/23/2019 0700 Gross per 24 hour  Intake 620 ml  Output 875 ml  Net -255 ml   Filed Weights   03/22/19 0344 03/22/19 0657 03/23/19 0443  Weight: 65.7 kg 66.7 kg 64.5 kg    Examination: General exam: Appears calm and comfortable  Respiratory system: Clear to auscultation. Respiratory effort normal.  On room air this morning Cardiovascular system: S1 & S2 heard, irregular rhythm. No pedal edema. Gastrointestinal system: Abdomen is nondistended, soft and nontender. Normal bowel sounds heard. Central nervous system: Alert and oriented. Non focal exam. Speech clear  Extremities: Symmetric in appearance bilaterally  Skin: No rashes, lesions or ulcers on exposed skin  Psychiatry: Judgement and insight appear stable. Mood & affect appropriate.    Data Reviewed: I have personally reviewed following labs and imaging  studies  CBC: Recent Labs  Lab 03/20/19 2210 03/21/19 0351 03/22/19 0410 03/23/19 0330  WBC 11.5* 11.1* 9.7 11.1*  NEUTROABS 9.3*  --   --   --   HGB 14.0 13.3 12.7* 14.0  HCT 41.8 40.9 38.5* 41.1  MCV 96.1 95.8 96.7 93.8  PLT 280 281 254 [id number]   Basic Metabolic Panel: Recent Labs  Lab 03/20/19 2210 03/21/19 1157 03/22/19 0410 03/22/19 1118 03/23/19 0330  NA 135 136 138  --  139  K 5.2* 4.1 3.5  --  3.6  CL  103 98 99  --  99  CO2 20* 25 27  --  27  GLUCOSE 137* 164* 101*  --  128*  BUN 45* 36* 35*  --  33*  CREATININE 1.14 1.14 1.08  --  1.30*  CALCIUM 9.3 8.7* 8.5*  --  8.8*  MG  --   --   --  1.9  --    GFR: Estimated Creatinine Clearance: 33.1 mL/min (A) (by C-G formula based on SCr of 1.3 mg/dL (H)). Liver Function Tests: Recent Labs  Lab 03/20/19 2210 03/21/19 0351 03/22/19 0410 03/23/19 0330  AST 88* 78* 57* 42*  ALT 104* 99* 75* 65*  ALKPHOS 135* 127* 111 112  BILITOT 0.9 1.4* 1.2 1.2  PROT 6.4* 5.8* 5.2* 6.0*  ALBUMIN 3.4* 3.1* 2.7* 2.9*   No results for input(s): LIPASE, AMYLASE in the last 168 hours. No results for input(s): AMMONIA in the last 168 hours. Coagulation Profile: Recent Labs  Lab 03/20/19 2315 03/21/19 0351 03/22/19 0410 03/23/19 0330  INR 3.8* 3.9* 3.8* 3.3*   Cardiac Enzymes: No results for input(s): CKTOTAL, CKMB, CKMBINDEX, TROPONINI in the last 168 hours. BNP (last 3 results) No results for input(s): PROBNP in the last 8760 hours. HbA1C: No results for input(s): HGBA1C in the last 72 hours. CBG: No results for input(s): GLUCAP in the last 168 hours. Lipid Profile: No results for input(s): CHOL, HDL, LDLCALC, TRIG, CHOLHDL, LDLDIRECT in the last 72 hours. Thyroid Function Tests: No results for input(s): TSH, T4TOTAL, FREET4, T3FREE, THYROIDAB in the last 72 hours. Anemia Panel: No results for input(s): VITAMINB12, FOLATE, FERRITIN, TIBC, IRON, RETICCTPCT in the last 72 hours. Sepsis Labs: No results for input(s): PROCALCITON, LATICACIDVEN in the last 168 hours.  Recent Results (from the past 240 hour(s))  SARS CORONAVIRUS 2 (TAT 6-24 HRS) Nasopharyngeal Nasopharyngeal Swab     Status: None   Collection Time: 03/21/19  4:34 AM   Specimen: Nasopharyngeal Swab  Result Value Ref Range Status   SARS Coronavirus 2 NEGATIVE NEGATIVE Final    Comment: (NOTE) SARS-CoV-2 target nucleic acids are NOT DETECTED. The SARS-CoV-2 RNA is generally  detectable in upper and lower respiratory specimens during the acute phase of infection. Negative results do not preclude SARS-CoV-2 infection, do not rule out co-infections with other pathogens, and should not be used as the sole basis for treatment or other patient management decisions. Negative results must be combined with clinical observations, patient history, and epidemiological information. The expected result is Negative. Fact Sheet for Patients: SugarRoll.be Fact Sheet for Healthcare Providers: https://www.woods-mathews.com/ This test is not yet approved or cleared by the [place] FDA and  has been authorized for detection and/or diagnosis of SARS-CoV-2 by FDA under an Emergency Use Authorization (EUA). This EUA will remain  in effect (meaning this test can be used) for the duration of the COVID-19 declaration under Section 56 4(b)(1) of the Act, 21 U.S.C. section 360bbb-3(b)(1), unless  the authorization is terminated or revoked sooner. Performed at [facility], [place] [address], [place], [place] [address]       Radiology Studies: No results found.    Scheduled Meds: . furosemide  20 mg Intravenous BID  . potassium chloride  20 mEq Oral BID  . sodium chloride flush  3 mL Intravenous Q12H  . sodium chloride flush  3 mL Intravenous Q12H   Continuous Infusions: . sodium chloride       LOS: 2 days      Time spent: 25 minutes   Dessa Phi, DO [facility] 03/23/2019, 9:49 AM   Available via Epic secure chat 7am-7pm After these hours, please refer to coverage provider listed on [email or web address]

## 2019-03-23 NOTE — Progress Notes (Addendum)
Progress Note  Patient Name: Gregory Werner Date of Encounter: 03/23/2019  Primary Cardiologist: Minus Breeding, MD   Subjective   Feels tired from laying in bed, but otherwise no chest pain, palpitations, shortness of breath.  Patient's daughter Vicente Males is in the room today.  It is the patient's wedding anniversary today.  All family questions answered to the best my ability.  Inpatient Medications    Scheduled Meds: . furosemide  20 mg Intravenous BID  . potassium chloride  20 mEq Oral BID  . sodium chloride flush  3 mL Intravenous Q12H  . sodium chloride flush  3 mL Intravenous Q12H   Continuous Infusions: . sodium chloride     PRN Meds: sodium chloride, acetaminophen, docusate sodium, senna, sodium chloride flush   Vital Signs    Vitals:   03/23/19 0035 03/23/19 0439 03/23/19 0443 03/23/19 0732  BP: (!) 147/98 (!) 158/96  (!) 156/103  Pulse: 67 62  68  Resp: 17 17  18   Temp: 99 F (37.2 C) 99.1 F (37.3 C)  98.9 F (37.2 C)  TempSrc: Oral Oral  Oral  SpO2: 96% 91%  94%  Weight:   64.5 kg   Height:        Intake/Output Summary (Last 24 hours) at 03/23/2019 1223 Last data filed at 03/23/2019 0900 Gross per 24 hour  Intake 860 ml  Output 875 ml  Net -15 ml   Last 3 Weights 03/23/2019 03/22/2019 03/22/2019  Weight (lbs) 142 lb 3.2 oz 147 lb 144 lb 13.5 oz  Weight (kg) 64.501 kg 66.679 kg 65.7 kg      Telemetry    Atrial fibrillation- Personally Reviewed  ECG    No new- Personally Reviewed  Physical Exam   GEN: No acute distress.  Elderly, frail gentleman. Neck: JVD elevated to the mid one third of the neck at approximately 45 degrees Cardiac: iRRR, no murmurs, rubs, or gallops.  Respiratory:  Bibasilar crackles. GI: Soft, nontender, non-distended  MS: No edema; No deformity. Neuro:  Nonfocal  Psych: Normal affect   Labs    High Sensitivity Troponin:   Recent Labs  Lab 03/21/19 0200 03/21/19 0351  TROPONINIHS 103* 100*       Chemistry Recent Labs  Lab 03/21/19 0351 03/21/19 1157 03/22/19 0410 03/23/19 0330  NA  --  136 138 139  K  --  4.1 3.5 3.6  CL  --  98 99 99  CO2  --  25 27 27   GLUCOSE  --  164* 101* 128*  BUN  --  36* 35* 33*  CREATININE  --  1.14 1.08 1.30*  CALCIUM  --  8.7* 8.5* 8.8*  PROT 5.8*  --  5.2* 6.0*  ALBUMIN 3.1*  --  2.7* 2.9*  AST 78*  --  57* 42*  ALT 99*  --  75* 65*  ALKPHOS 127*  --  111 112  BILITOT 1.4*  --  1.2 1.2  GFRNONAA  --  56* 59* 47*  GFRAA  --  >60 >60 55*  ANIONGAP  --  13 12 13      Hematology Recent Labs  Lab 03/21/19 0351 03/22/19 0410 03/23/19 0330  WBC 11.1* 9.7 11.1*  RBC 4.27 3.98* 4.38  HGB 13.3 12.7* 14.0  HCT 40.9 38.5* 41.1  MCV 95.8 96.7 93.8  MCH 31.1 31.9 32.0  MCHC 32.5 33.0 34.1  RDW 13.3 13.1 13.2  PLT 281 254 288    BNP Recent Labs  Lab 03/20/19  2226  BNP 3,183.6*     DDimer No results for input(s): DDIMER in the last 168 hours.   Radiology    No results found.  Cardiac Studies   Echo 1. Left ventricular ejection fraction, by visual estimation, is 30 to 35%. The left ventricle has severely decreased function. Left ventricular septal wall thickness was severely increased. Mildly increased left ventricular posterior wall thickness.  There is mildly increased left ventricular hypertrophy. 2. Abnormal septal motion consistent with left bundle branch block. 3. Left ventricular diastolic function could not be evaluated. 4. The left ventricle demonstrates global hypokinesis. 5. Global right ventricle has moderately reduced systolic function.The right ventricular size is normal. No increase in right ventricular wall thickness. 6. Left atrial size was severely dilated. 7. Right atrial size was not assessed. 8. The mitral valve is abnormal. Trivial mitral valve regurgitation. 9. The tricuspid valve is grossly normal. Tricuspid valve regurgitation is mild. 10. The aortic valve is tricuspid. Aortic valve  regurgitation is mild. Mild aortic valve sclerosis without stenosis. 11. The pulmonic valve was grossly normal. Pulmonic valve regurgitation is trivial. 12. Aortic dilatation noted. 13. There is mild dilatation of the ascending aorta and of the aortic root measuring 40 mm. 14. Moderately elevated pulmonary artery systolic pressure. 15. The tricuspid regurgitant velocity is 3.40 m/s, and with an assumed right atrial pressure of 15 mmHg, the estimated right ventricular systolic pressure is moderately elevated at 61.2 mmHg. 16. The inferior vena cava is dilated in size with <50% respiratory variability, suggesting right atrial pressure of 15 mmHg.  Patient Profile       85 y.o. male with a hx of permanent Afib on coumadin, HTN, HLD, prostate cancerwho is being seen today for the evaluation of new onsetHeart failureand atrial fibrillation with slow ventricular response.  Assessment & Plan    Principal Problem:   CHF (congestive heart failure) (HCC) Active Problems:   HYPERTENSION, BENIGN   Atrial fibrillation (HCC)   LBBB (left bundle branch block)   Elevated LFTs  Acute combined systolic and diastolic heart failure-his ejection fraction of 30 to 35% is new with a yet undetermined etiology.  His urine appears concentrated in the urinary collection bag and he has had only 800 mL out on average yesterday and the day prior.  He is net -145 mL for the admission. I question whether ins and outs are being charted appropriately since his JVP appears to be improving. Plan for R/LH cath on Monday pending INR.  He has had no significant lower extremity edema this admission, and no significant sacral edema. Creatinine has increased to 1.3 today, we will observe this over the next day. Plan for bladder scan. Lasix 20 mg IV BID.  Elevated WBC count - observe. No fevers.  INR 3.3 today. Improving with holding coumadin.   Ramipril on hold. BP elevated. Can use hydralazine for SBP >160 mmHg. No BB  at this time due to Afib SVR      For questions or updates, please contact [place] Please consult [email or web address] for contact info under        Signed, Elouise Munroe, MD  03/23/2019, 12:23 PM    TIME SPENT WITH PATIENT: 35 minutes of direct patient care. More than 50% of that time was spent on coordination of care and counseling regarding heart failure etiology and management.  Cherlynn Kaiser, MD [place]

## 2019-03-23 NOTE — Plan of Care (Signed)
  Problem: Nutrition: Goal: Adequate nutrition will be maintained Outcome: Completed/Met   Problem: Coping: Goal: Level of anxiety will decrease Outcome: Completed/Met   Problem: Pain Managment: Goal: General experience of comfort will improve Outcome: Completed/Met   

## 2019-03-23 NOTE — Progress Notes (Signed)
Patient's daughter is at the bedside. Updated her about her father's current medications and plan during his hospital stay.

## 2019-03-24 LAB — CBC
HCT: 43.3 % (ref 39.0–52.0)
Hemoglobin: 14.1 g/dL (ref 13.0–17.0)
MCH: 31.3 pg (ref 26.0–34.0)
MCHC: 32.6 g/dL (ref 30.0–36.0)
MCV: 96.2 fL (ref 80.0–100.0)
Platelets: 298 10*3/uL (ref 150–400)
RBC: 4.5 MIL/uL (ref 4.22–5.81)
RDW: 13.4 % (ref 11.5–15.5)
WBC: 13.4 10*3/uL — ABNORMAL HIGH (ref 4.0–10.5)
nRBC: 0 % (ref 0.0–0.2)

## 2019-03-24 LAB — HEPATIC FUNCTION PANEL
ALT: 49 U/L — ABNORMAL HIGH (ref 0–44)
AST: 29 U/L (ref 15–41)
Albumin: 2.7 g/dL — ABNORMAL LOW (ref 3.5–5.0)
Alkaline Phosphatase: 101 U/L (ref 38–126)
Bilirubin, Direct: 0.4 mg/dL — ABNORMAL HIGH (ref 0.0–0.2)
Indirect Bilirubin: 0.9 mg/dL (ref 0.3–0.9)
Total Bilirubin: 1.3 mg/dL — ABNORMAL HIGH (ref 0.3–1.2)
Total Protein: 5.6 g/dL — ABNORMAL LOW (ref 6.5–8.1)

## 2019-03-24 LAB — PROTIME-INR
INR: 3.2 — ABNORMAL HIGH (ref 0.8–1.2)
Prothrombin Time: 32.4 seconds — ABNORMAL HIGH (ref 11.4–15.2)

## 2019-03-24 LAB — BASIC METABOLIC PANEL
Anion gap: 12 (ref 5–15)
BUN: 37 mg/dL — ABNORMAL HIGH (ref 8–23)
CO2: 29 mmol/L (ref 22–32)
Calcium: 8.9 mg/dL (ref 8.9–10.3)
Chloride: 101 mmol/L (ref 98–111)
Creatinine, Ser: 1.08 mg/dL (ref 0.61–1.24)
GFR calc Af Amer: >60 mL/min (ref >60)
GFR calc non Af Amer: 59 mL/min — ABNORMAL LOW (ref >60)
Glucose, Bld: 129 mg/dL — ABNORMAL HIGH (ref 70–99)
Potassium: 3.3 mmol/L — ABNORMAL LOW (ref 3.5–5.1)
Sodium: 142 mmol/L (ref 135–145)

## 2019-03-24 MED ORDER — SODIUM CHLORIDE 0.9% FLUSH: 3.0000 mL | INTRAVENOUS | Status: DC | PRN

## 2019-03-24 MED ORDER — SODIUM CHLORIDE 0.9 % IV SOLN: 250.0000 mL | INTRAVENOUS | Status: DC | PRN

## 2019-03-24 MED ORDER — SODIUM CHLORIDE 0.9 % IV SOLN: INTRAVENOUS | Status: DC

## 2019-03-24 MED ORDER — POTASSIUM CHLORIDE CRYS ER 20 MEQ PO TBCR
40.0000 meq | EXTENDED_RELEASE_TABLET | Freq: Once | ORAL | Status: AC
  Administered 2019-03-24: 40 meq via ORAL
  Filled 2019-03-24: qty 2

## 2019-03-24 MED ORDER — ASPIRIN 81 MG PO CHEW
81.0000 mg | CHEWABLE_TABLET | ORAL | Status: AC
  Filled 2019-03-24: qty 1

## 2019-03-24 NOTE — Progress Notes (Addendum)
Progress Note  Patient Name: Gregory Werner Date of Encounter: 03/24/2019  Primary Cardiologist: Minus Breeding, MD   Subjective   Feeling well, just eager to get up and about. Seen with his wife present today.   Inpatient Medications    Scheduled Meds: . furosemide  20 mg Intravenous BID  . sodium chloride flush  3 mL Intravenous Q12H  . sodium chloride flush  3 mL Intravenous Q12H   Continuous Infusions: . sodium chloride     PRN Meds: sodium chloride, acetaminophen, docusate sodium, senna, sodium chloride flush   Vital Signs    Vitals:   03/23/19 0732 03/23/19 1942 03/24/19 0406 03/24/19 0854  BP: (!) 156/103 (!) 143/80 (!) 148/70 (!) 150/73  Pulse: 68 72 70 68  Resp: 18 20 18 20   Temp: 98.9 F (37.2 C) (!) 97.2 F (36.2 C) 97.6 F (36.4 C) 98 F (36.7 C)  TempSrc: Oral Oral Oral Oral  SpO2: 94% 92% 92% 93%  Weight:   63.2 kg   Height:        Intake/Output Summary (Last 24 hours) at 03/24/2019 1008 Last data filed at 03/24/2019 [id number] Gross per 24 hour  Intake 580 ml  Output 1826 ml  Net -1246 ml   Last 3 Weights 03/24/2019 03/23/2019 03/22/2019  Weight (lbs) 139 lb 6.4 oz 142 lb 3.2 oz 147 lb  Weight (kg) 63.231 kg 64.501 kg 66.679 kg      Telemetry    Atrial fibrillation CVR rates 75-80- Personally Reviewed  ECG    No new- Personally Reviewed  Physical Exam   Constitutional: No acute distress, frail elderly gentleman Cardiovascular: irregular rhythm, normal rate, no murmurs. Radial pulses normal bilaterally. Jugular venous distention just above clavicle sitting upright (90 deg).  Respiratory: clear to auscultation bilaterally GI : normal bowel sounds, soft and nontender. No distention.   MSK: extremities warm, well perfused. No edema.  NEURO: grossly nonfocal exam, moves all extremities. PSYCH: alert and oriented x 3, normal mood and affect.   Labs    High Sensitivity Troponin:   Recent Labs  Lab 03/21/19 0200 03/21/19 0351    TROPONINIHS 103* 100*      Chemistry Recent Labs  Lab 03/22/19 0410 03/23/19 0330 03/24/19 0425  NA 138 139 142  K 3.5 3.6 3.3*  CL 99 99 101  CO2 27 27 29   GLUCOSE 101* 128* 129*  BUN 35* 33* 37*  CREATININE 1.08 1.30* 1.08  CALCIUM 8.5* 8.8* 8.9  PROT 5.2* 6.0* 5.6*  ALBUMIN 2.7* 2.9* 2.7*  AST 57* 42* 29  ALT 75* 65* 49*  ALKPHOS 111 112 101  BILITOT 1.2 1.2 1.3*  GFRNONAA 59* 47* 59*  GFRAA >60 55* >60  ANIONGAP 12 13 12      Hematology Recent Labs  Lab 03/22/19 0410 03/23/19 0330 03/24/19 0425  WBC 9.7 11.1* 13.4*  RBC 3.98* 4.38 4.50  HGB 12.7* 14.0 14.1  HCT 38.5* 41.1 43.3  MCV 96.7 93.8 96.2  MCH 31.9 32.0 31.3  MCHC 33.0 34.1 32.6  RDW 13.1 13.2 13.4  PLT 254 288 298    BNP Recent Labs  Lab 03/20/19 2226  BNP 3,183.6*     DDimer No results for input(s): DDIMER in the last 168 hours.   Radiology    No results found.  Cardiac Studies   Echo 1. Left ventricular ejection fraction, by visual estimation, is 30 to 35%. The left ventricle has severely decreased function. Left ventricular septal wall thickness was severely  increased. Mildly increased left ventricular posterior wall thickness.  There is mildly increased left ventricular hypertrophy. 2. Abnormal septal motion consistent with left bundle branch block. 3. Left ventricular diastolic function could not be evaluated. 4. The left ventricle demonstrates global hypokinesis. 5. Global right ventricle has moderately reduced systolic function.The right ventricular size is normal. No increase in right ventricular wall thickness. 6. Left atrial size was severely dilated. 7. Right atrial size was not assessed. 8. The mitral valve is abnormal. Trivial mitral valve regurgitation. 9. The tricuspid valve is grossly normal. Tricuspid valve regurgitation is mild. 10. The aortic valve is tricuspid. Aortic valve regurgitation is mild. Mild aortic valve sclerosis without stenosis. 11. The  pulmonic valve was grossly normal. Pulmonic valve regurgitation is trivial. 12. Aortic dilatation noted. 13. There is mild dilatation of the ascending aorta and of the aortic root measuring 40 mm. 14. Moderately elevated pulmonary artery systolic pressure. 15. The tricuspid regurgitant velocity is 3.40 m/s, and with an assumed right atrial pressure of 15 mmHg, the estimated right ventricular systolic pressure is moderately elevated at 61.2 mmHg. 16. The inferior vena cava is dilated in size with <50% respiratory variability, suggesting right atrial pressure of 15 mmHg.  Patient Profile       85 y.o. male with a hx of permanent Afib on coumadin, HTN, HLD, prostate cancerwho is being seen today for the evaluation of new onsetHeart failureand atrial fibrillation with slow ventricular response.  Assessment & Plan    Principal Problem:   CHF (congestive heart failure) (HCC) Active Problems:   HYPERTENSION, BENIGN   Atrial fibrillation (HCC)   LBBB (left bundle branch block)   Elevated LFTs  Acute combined systolic and diastolic heart failure-his ejection fraction of 30 to 35% is new with a yet undetermined etiology.   - Plan for R/LH cath on Monday pending INR. INR is 3.2 today, unlikely we will be able to proceed Monday.  - Patient understands it may be a few more days until we can perform ischemic evaluation. Could consider CCTA however rate is somewhat irregular and may not allow for diagnostic study. Could consider outpatient cath when heart failure has fully been addressed, 1-2 days longer.   He has had no significant lower extremity edema this admission, and no significant sacral edema. Creatinine 1.08 today Lasix 20 mg IV BID.  Elevated WBC count - observe. No fevers. Per IM. Consider UA with micro.  INR 3.2 today. Improving with holding coumadin.   Ramipril on hold. BP elevated. Can use hydralazine for SBP >160 mmHg. No BB at this time due to Afib SVR.      For questions  or updates, please contact [place] Please consult [email or web address] for contact info under        Signed, Elouise Munroe, MD  03/24/2019, 10:08 AM    TIME SPENT WITH PATIENT: 35 minutes of direct patient care. More than 50% of that time was spent on coordination of care and counseling regarding heart failure etiology and management.  Cherlynn Kaiser, MD [place]

## 2019-03-24 NOTE — Progress Notes (Signed)
PROGRESS NOTE    Gregory Werner  [id number] DOB: 06-May-1924 DOA: 03/20/2019 PCP: [name], DO     Brief Narrative:  Gregory Werner is a 85 y.o. male with medical history significant of A. fib, hypertension, hyperlipidemia, prostate cancer treated with radiation being sent to the ED by his PCP for evaluation of an abnormal EKG. Patient states he saw his PCP yesterday and was advised to come into the ED as his EKG appeared abnormal.  He has been experiencing shortness of breath, orthopnea, and a cough for the past few days.  Denies chest pain. Work up in the ED was significant for BNP 3183, Chest x-ray showing cardiomegaly with vascular congestion and possible mild interstitial edema. Patient received IV Lasix 20 mg.  He was admitted for new onset CHF as well as new left bundle branch block on EKG.  Cardiology was consulted.  New events last 24 hours / Subjective: No complaints of chest pain or shortness of breath today.  Documented 1800 mL urine output from condom catheter yesterday.  Creatinine back to his baseline.  Assessment & Plan:   Principal Problem:   CHF (congestive heart failure) (HCC) Active Problems:   HYPERTENSION, BENIGN   Atrial fibrillation (HCC)   LBBB (left bundle branch block)   Elevated LFTs   New onset acute systolic heart failure -Admitted with chief complaint of dyspnea, orthopnea.  BNP 3183 on admission as well as chest x-ray showing cardiomegaly with vascular congestion and possible mild interstitial edema -Strict I's and O's, daily weight (these have not been measured accurately) -Echocardiogram EF 30 to 35% -Cardiology following, heart cath planned for Monday -IV Lasix  Permanent atrial fibrillation -On Coumadin as an outpatient, hold due to supratherapeutic INR -IV heparin once INR < 2  LBBB -Cardiology following, heart cath planned for Monday  Essential hypertension -Hold ramipril -Lasix  Leukocytosis -Unclear etiology.  No clear sign  of any infectious process.  Afebrile.  Continue to monitor CBC.  Hypokalemia -Replace, trend  Elevated liver enzyme -Right upper quadrant ultrasound unremarkable -Trending down   DVT prophylaxis: On Coumadin as an outpatient Code Status: DNR Family Communication: No family at bedside Disposition Plan: Heart cath planned for Monday   Consultants:   Cardiology  Procedures:   None  Antimicrobials:  Anti-infectives (From admission, onward)   None       Objective: Vitals:   03/23/19 0732 03/23/19 1942 03/24/19 0406 03/24/19 0854  BP: (!) 156/103 (!) 143/80 (!) 148/70 (!) 150/73  Pulse: 68 72 70 68  Resp: 18 20 18 20   Temp: 98.9 F (37.2 C) (!) 97.2 F (36.2 C) 97.6 F (36.4 C) 98 F (36.7 C)  TempSrc: Oral Oral Oral Oral  SpO2: 94% 92% 92% 93%  Weight:   63.2 kg   Height:        Intake/Output Summary (Last 24 hours) at 03/24/2019 0948 Last data filed at 03/24/2019 0939 Gross per 24 hour  Intake 580 ml  Output 1826 ml  Net -1246 ml   Filed Weights   03/22/19 0657 03/23/19 0443 03/24/19 0406  Weight: 66.7 kg 64.5 kg 63.2 kg    Examination: General exam: Appears calm and comfortable  Respiratory system: Clear to auscultation. Respiratory effort normal.  On room air Cardiovascular system: S1 & S2 heard, irregular rhythm. No pedal edema. Gastrointestinal system: Abdomen is nondistended, soft and nontender. Normal bowel sounds heard. Central nervous system: Alert and oriented. Non focal exam. Speech clear  Extremities: Symmetric in appearance bilaterally  Skin: No rashes, lesions or ulcers on exposed skin  Psychiatry: Judgement and insight appear stable. Mood & affect appropriate.   Data Reviewed: I have personally reviewed following labs and imaging studies  CBC: Recent Labs  Lab 03/20/19 2210 03/21/19 0351 03/22/19 0410 03/23/19 0330 03/24/19 0425  WBC 11.5* 11.1* 9.7 11.1* 13.4*  NEUTROABS 9.3*  --   --   --   --   HGB 14.0 13.3 12.7* 14.0  14.1  HCT 41.8 40.9 38.5* 41.1 43.3  MCV 96.1 95.8 96.7 93.8 96.2  PLT 280 281 254 288 [id number]   Basic Metabolic Panel: Recent Labs  Lab 03/20/19 2210 03/21/19 1157 03/22/19 0410 03/22/19 1118 03/23/19 0330 03/24/19 0425  NA 135 136 138  --  139 142  K 5.2* 4.1 3.5  --  3.6 3.3*  CL 103 98 99  --  99 101  CO2 20* 25 27  --  27 29  GLUCOSE 137* 164* 101*  --  128* 129*  BUN 45* 36* 35*  --  33* 37*  CREATININE 1.14 1.14 1.08  --  1.30* 1.08  CALCIUM 9.3 8.7* 8.5*  --  8.8* 8.9  MG  --   --   --  1.9  --   --    GFR: Estimated Creatinine Clearance: 39 mL/min (by C-G formula based on SCr of 1.08 mg/dL). Liver Function Tests: Recent Labs  Lab 03/20/19 2210 03/21/19 0351 03/22/19 0410 03/23/19 0330 03/24/19 0425  AST 88* 78* 57* 42* 29  ALT 104* 99* 75* 65* 49*  ALKPHOS 135* 127* 111 112 101  BILITOT 0.9 1.4* 1.2 1.2 1.3*  PROT 6.4* 5.8* 5.2* 6.0* 5.6*  ALBUMIN 3.4* 3.1* 2.7* 2.9* 2.7*   No results for input(s): LIPASE, AMYLASE in the last 168 hours. No results for input(s): AMMONIA in the last 168 hours. Coagulation Profile: Recent Labs  Lab 03/20/19 2315 03/21/19 0351 03/22/19 0410 03/23/19 0330 03/24/19 0425  INR 3.8* 3.9* 3.8* 3.3* 3.2*   Cardiac Enzymes: No results for input(s): CKTOTAL, CKMB, CKMBINDEX, TROPONINI in the last 168 hours. BNP (last 3 results) No results for input(s): PROBNP in the last 8760 hours. HbA1C: No results for input(s): HGBA1C in the last 72 hours. CBG: No results for input(s): GLUCAP in the last 168 hours. Lipid Profile: No results for input(s): CHOL, HDL, LDLCALC, TRIG, CHOLHDL, LDLDIRECT in the last 72 hours. Thyroid Function Tests: No results for input(s): TSH, T4TOTAL, FREET4, T3FREE, THYROIDAB in the last 72 hours. Anemia Panel: No results for input(s): VITAMINB12, FOLATE, FERRITIN, TIBC, IRON, RETICCTPCT in the last 72 hours. Sepsis Labs: No results for input(s): PROCALCITON, LATICACIDVEN in the last 168 hours.  Recent  Results (from the past 240 hour(s))  SARS CORONAVIRUS 2 (TAT 6-24 HRS) Nasopharyngeal Nasopharyngeal Swab     Status: None   Collection Time: 03/21/19  4:34 AM   Specimen: Nasopharyngeal Swab  Result Value Ref Range Status   SARS Coronavirus 2 NEGATIVE NEGATIVE Final    Comment: (NOTE) SARS-CoV-2 target nucleic acids are NOT DETECTED. The SARS-CoV-2 RNA is generally detectable in upper and lower respiratory specimens during the acute phase of infection. Negative results do not preclude SARS-CoV-2 infection, do not rule out co-infections with other pathogens, and should not be used as the sole basis for treatment or other patient management decisions. Negative results must be combined with clinical observations, patient history, and epidemiological information. The expected result is Negative. Fact Sheet for Patients: SugarRoll.be Fact Sheet for Healthcare Providers: https://www.woods-mathews.com/  This test is not yet approved or cleared by the [place] and  has been authorized for detection and/or diagnosis of SARS-CoV-2 by FDA under an Emergency Use Authorization (EUA). This EUA will remain  in effect (meaning this test can be used) for the duration of the COVID-19 declaration under Section 56 4(b)(1) of the Act, 21 U.S.C. section 360bbb-3(b)(1), unless the authorization is terminated or revoked sooner. Performed at [facility], [place] [address], [place],  [address]       Radiology Studies: No results found.    Scheduled Meds: . furosemide  20 mg Intravenous BID  . sodium chloride flush  3 mL Intravenous Q12H  . sodium chloride flush  3 mL Intravenous Q12H   Continuous Infusions: . sodium chloride       LOS: 3 days      Time spent: 25 minutes   Dessa Phi, DO [facility] 03/24/2019, 9:48 AM   Available via Epic secure chat 7am-7pm After these hours, please refer to coverage provider  listed on [email or web address]

## 2019-03-24 NOTE — Progress Notes (Signed)
[place] for warfarin Indication: atrial fibrillation  Allergies  Allergen Reactions  . Cephalexin Diarrhea    Patient Measurements: Height: 5\' 10"  (177.8 cm) Weight: 139 lb 6.4 oz (63.2 kg)(scale a) IBW/kg (Calculated) : 73 Heparin Dosing Weight: 72.8 kg   Vital Signs: Temp: 97.6 F (36.4 C) (12/20 0406) Temp Source: Oral (12/20 0406) BP: 148/70 (12/20 0406) Pulse Rate: 70 (12/20 0406)  Labs: Recent Labs    03/22/19 0410 03/23/19 0330 03/24/19 0425  HGB 12.7* 14.0 14.1  HCT 38.5* 41.1 43.3  PLT 254 288 298  LABPROT 37.6* 33.3* 32.4*  INR 3.8* 3.3* 3.2*  CREATININE 1.08 1.30* 1.08    Estimated Creatinine Clearance: 39 mL/min (by C-G formula based on SCr of 1.08 mg/dL).   Medical History: Past Medical History:  Diagnosis Date  . Arthritis   . Atrial fibrillation ([place])   . Diverticulosis    history  . Dysphagia 2001   history s/p esophageal dilatation  . Dysrhythmia    hx. Atrial Fibrillation, RBBB  . GERD (gastroesophageal reflux disease)   . Hearing loss    bilateral hearing aids  . Hyperlipidemia   . Hypertension    benign  . Lung infiltrate 05/2010   left lower lobe infiltrate, questionable pneumonia  . Prostate cancer ([place]) 2001   treated with radiation    Medications:  Scheduled:  . furosemide  20 mg Intravenous BID  . potassium chloride  40 mEq Oral Once  . sodium chloride flush  3 mL Intravenous Q12H  . sodium chloride flush  3 mL Intravenous Q12H    Assessment: 73 yom presenting with acute heart failure exacerbation. On warfarin PTA for hx Afib - PTA regimen 5 mg daily except 2.5 mg MWF (LD 12/16 at 0700). INR on admit 3.8.   Plan for heparin infusion once INR<2. Planning R/LHC on Monday pending INR.   INR today is still supratherapeutic at 3.2 with no warfarin given this admission. CBC WNL and stable. No overt bleeding noted.    Goal of Therapy:  INR 2-3 Monitor platelets by anticoagulation  protocol: Yes   Plan:  Start heparin infusion once INR<2  Continue to hold warfarin today Monitor daily INR and CBC Monitor s/sx of bleeding  Richardine Service, PharmD PGY1 Pharmacy Resident Phone: [phone] 03/24/2019  8:21 AM  Please check [email or web address] for unit-specific pharmacy phone numbers.

## 2019-03-25 ENCOUNTER — Encounter (HOSPITAL_COMMUNITY)
Admission: EM | Disposition: A | Discharge status: Hospice | Payer: Self-pay | Source: Home / Self Care | Attending: Internal Medicine

## 2019-03-25 DIAGNOSIS — I5021 Acute systolic (congestive) heart failure: Secondary | ICD-10-CM

## 2019-03-25 LAB — BASIC METABOLIC PANEL
Anion gap: 11 (ref 5–15)
BUN: 41 mg/dL — ABNORMAL HIGH (ref 8–23)
CO2: 31 mmol/L (ref 22–32)
Calcium: 8.9 mg/dL (ref 8.9–10.3)
Chloride: 104 mmol/L (ref 98–111)
Creatinine, Ser: 1.22 mg/dL (ref 0.61–1.24)
GFR calc Af Amer: 59 mL/min — ABNORMAL LOW (ref >60)
GFR calc non Af Amer: 51 mL/min — ABNORMAL LOW (ref >60)
Glucose, Bld: 122 mg/dL — ABNORMAL HIGH (ref 70–99)
Potassium: 3.4 mmol/L — ABNORMAL LOW (ref 3.5–5.1)
Sodium: 146 mmol/L — ABNORMAL HIGH (ref 135–145)

## 2019-03-25 LAB — CBC
HCT: 45.7 % (ref 39.0–52.0)
Hemoglobin: 14.6 g/dL (ref 13.0–17.0)
MCH: 31.3 pg (ref 26.0–34.0)
MCHC: 31.9 g/dL (ref 30.0–36.0)
MCV: 97.9 fL (ref 80.0–100.0)
Platelets: 290 10*3/uL (ref 150–400)
RBC: 4.67 MIL/uL (ref 4.22–5.81)
RDW: 13.5 % (ref 11.5–15.5)
WBC: 15.2 10*3/uL — ABNORMAL HIGH (ref 4.0–10.5)
nRBC: 0 % (ref 0.0–0.2)

## 2019-03-25 LAB — PROTIME-INR
INR: 2.8 — ABNORMAL HIGH (ref 0.8–1.2)
Prothrombin Time: 29.2 seconds — ABNORMAL HIGH (ref 11.4–15.2)

## 2019-03-25 LAB — MRSA PCR SCREENING: MRSA by PCR: NEGATIVE

## 2019-03-25 SURGERY — LEFT HEART CATH AND CORONARY ANGIOGRAPHY
Anesthesia: LOCAL

## 2019-03-25 MED ORDER — POTASSIUM CHLORIDE CRYS ER 20 MEQ PO TBCR
40.0000 meq | EXTENDED_RELEASE_TABLET | Freq: Once | ORAL | Status: AC
  Administered 2019-03-25: 09:00:00 40 meq via ORAL
  Filled 2019-03-25: qty 2

## 2019-03-25 MED ORDER — PHYTONADIONE 5 MG PO TABS
2.5000 mg | ORAL_TABLET | Freq: Once | ORAL | Status: AC
  Administered 2019-03-25: 12:00:00 2.5 mg via ORAL
  Filled 2019-03-25: qty 1

## 2019-03-25 NOTE — TOC Progression Note (Signed)
Transition of Care [facility]) - Progression Note    Patient Details  Name: Gregory Werner MRN: [id number] Date of Birth: 08-22-1924  [facility] CM/SW Contact  Zenon Mayo, RN Phone Number: 03/25/2019, 3:25 PM  Clinical Narrative:    From home with wife,  NCM offered choice, he states he  is active with [facility] for HHPT, this is confirmed with wife and Baxter Flattery with [facility]. Will need resumption orders at dc.  Wife states they will need a 3 n 1.  Referral made to [facility] with [employer]. Wife states she will also transport patient home when ready.    Expected Discharge Plan: [place] Barriers to Discharge: Continued Medical Work up  Expected Discharge Plan and Services Expected Discharge Plan: [place]   Discharge Planning Services: CM Consult Post Acute Care Choice: Home Health, Resumption of Svcs/PTA Provider Living arrangements for the past 2 months: Single Family Home                 DME Arranged: 3-N-1 DME Agency: [facility]       HH Arranged: PT [place]: [place] Date [place]: 03/25/19 Time [place]: [id number] Representative spoke with at [place]: [place] ([place]) Interventions    Readmission Risk Interventions No flowsheet data found.

## 2019-03-25 NOTE — H&P (View-Only) (Signed)
Progress Note  Patient Name: Gregory Werner Date of Encounter: 03/25/2019  Primary Cardiologist: Minus Breeding, MD   Subjective   No chest pain or dyspnea. Cath rescheduled to tomorrow due to INR of 2.8.   Inpatient Medications    Scheduled Meds: . aspirin  81 mg Oral Pre-Cath  . furosemide  20 mg Intravenous BID  . sodium chloride flush  3 mL Intravenous Q12H  . sodium chloride flush  3 mL Intravenous Q12H   Continuous Infusions: . sodium chloride    . sodium chloride    . sodium chloride 10 mL/hr at 03/25/19 0013   PRN Meds: sodium chloride, sodium chloride, acetaminophen, docusate sodium, senna, sodium chloride flush, sodium chloride flush   Vital Signs    Vitals:   03/24/19 1413 03/24/19 2007 03/25/19 0022 03/25/19 0423  BP: 140/83 (!) 152/81 124/90 (!) 141/90  Pulse: 60 (!) 40 67 67  Resp: 20 18 18 18   Temp: 97.9 F (36.6 C) 98.3 F (36.8 C) 97.7 F (36.5 C) 98.6 F (37 C)  TempSrc: Oral Oral Oral Oral  SpO2: 93% 96% 93% 96%  Weight:    62 kg  Height:        Intake/Output Summary (Last 24 hours) at 03/25/2019 [id number] Last data filed at 03/25/2019 0830 Gross per 24 hour  Intake 780 ml  Output 1901 ml  Net -1121 ml   Last 3 Weights 03/25/2019 03/24/2019 03/23/2019  Weight (lbs) 136 lb 11.2 oz 139 lb 6.4 oz 142 lb 3.2 oz  Weight (kg) 62.007 kg 63.231 kg 64.501 kg      Telemetry    Atrial fibrillation at rate of 80s with PVCs- Personally Reviewed  ECG    N/A  Physical Exam   GEN: Elderly male in no acute distress.   Neck: No JVD Cardiac: IR IR , no murmurs, rubs, or gallops.  Respiratory: Clear to auscultation bilaterally. GI: Soft, nontender, non-distended  MS: No edema; No deformity. Neuro:  Nonfocal  Psych: Normal affect   Labs    High Sensitivity Troponin:   Recent Labs  Lab 03/21/19 0200 03/21/19 0351  TROPONINIHS 103* 100*      Chemistry Recent Labs  Lab 03/22/19 0410 03/23/19 0330 03/24/19 0425 03/25/19 0509  NA 138  139 142 146*  K 3.5 3.6 3.3* 3.4*  CL 99 99 101 104  CO2 27 27 29 31   GLUCOSE 101* 128* 129* 122*  BUN 35* 33* 37* 41*  CREATININE 1.08 1.30* 1.08 1.22  CALCIUM 8.5* 8.8* 8.9 8.9  PROT 5.2* 6.0* 5.6*  --   ALBUMIN 2.7* 2.9* 2.7*  --   AST 57* 42* 29  --   ALT 75* 65* 49*  --   ALKPHOS 111 112 101  --   BILITOT 1.2 1.2 1.3*  --   GFRNONAA 59* 47* 59* 51*  GFRAA >60 55* >60 59*  ANIONGAP 12 13 12 11      Hematology Recent Labs  Lab 03/23/19 0330 03/24/19 0425 03/25/19 0509  WBC 11.1* 13.4* 15.2*  RBC 4.38 4.50 4.67  HGB 14.0 14.1 14.6  HCT 41.1 43.3 45.7  MCV 93.8 96.2 97.9  MCH 32.0 31.3 31.3  MCHC 34.1 32.6 31.9  RDW 13.2 13.4 13.5  PLT 288 298 290    BNP Recent Labs  Lab 03/20/19 2226  BNP 3,183.6*      Radiology    No results found.  Cardiac Studies   Echo12/17/2020 1. Left ventricular ejection fraction, by visual estimation,  is 30 to 35%. The left ventricle has severely decreased function. Left ventricular septal wall thickness was severely increased. Mildly increased left ventricular posterior wall thickness.  There is mildly increased left ventricular hypertrophy. 2. Abnormal septal motion consistent with left bundle branch block. 3. Left ventricular diastolic function could not be evaluated. 4. The left ventricle demonstrates global hypokinesis. 5. Global right ventricle has moderately reduced systolic function.The right ventricular size is normal. No increase in right ventricular wall thickness. 6. Left atrial size was severely dilated. 7. Right atrial size was not assessed. 8. The mitral valve is abnormal. Trivial mitral valve regurgitation. 9. The tricuspid valve is grossly normal. Tricuspid valve regurgitation is mild. 10. The aortic valve is tricuspid. Aortic valve regurgitation is mild. Mild aortic valve sclerosis without stenosis. 11. The pulmonic valve was grossly normal. Pulmonic valve regurgitation is trivial. 12. Aortic  dilatation noted. 13. There is mild dilatation of the ascending aorta and of the aortic root measuring 40 mm. 14. Moderately elevated pulmonary artery systolic pressure. 15. The tricuspid regurgitant velocity is 3.40 m/s, and with an assumed right atrial pressure of 15 mmHg, the estimated right ventricular systolic pressure is moderately elevated at 61.2 mmHg. 16. The inferior vena cava is dilated in size with <50% respiratory variability, suggesting right atrial pressure of 15 mmHg.  Patient Profile     85 y.o. male with a hx of permanent Afib on coumadin, HTN, HLD, prostate cancerseen for new onset CHF.   Assessment & Plan    1. Acute combined CHF - BNP 3183. Echo showed LVEF of 30-35% (was 60-65% in 2016). R & L cath rescheduled to tomorrow due to INR of 2.8 today. It was 3.8 three days ago. Could consider vit K 2.5mg  (defer to MD). Close to Euvolemic. Consider changing lasix to PO tomorrow. Consider adding BB but slow rate initially.  Ramipril on hold but could consider ARB vs Entresto. Will review medications with MD.   2. Hypokalemic - Supplement given  3. HTN - As above  4. Persistent afib - HR in 70-80s. Not on any rate control agent at baseline.  - Coumadin on hold for cath. Plan to start heparin once INR less than 2.   5. LBBB - New. Plan for cath as above.    For questions or updates, please contact [place] Please consult [email or web address] for contact info under        Signed, Leanor Kail, PA  03/25/2019, 9:09 AM    History and all data above reviewed.  Patient examined.  I agree with the findings as above.  He denies pain or SOB.  No distressThe patient exam reveals COR:RRR  ,  Lungs: Clear  ,  Abd: Positive bowel sounds, no rebound no guarding, Ext No edemqa  .  All available labs, radiology testing, previous records reviewed. Agree with documented assessment and plan. New acute systolic HF:  Plan cath in the AM.  I will give vit K.  Atrial fib:  Rate  controlled.  Holding anticoagulation.  LBBB.  Plan for cath as above.   Jeneen Rinks [facility]  10:38 AM  03/25/2019

## 2019-03-25 NOTE — Plan of Care (Signed)
  Problem: Education: Goal: Knowledge of General Education information will improve Description Including pain rating scale, medication(s)/side effects and non-pharmacologic comfort measures Outcome: Progressing   Problem: Health Behavior/Discharge Planning: Goal: Ability to manage health-related needs will improve Outcome: Progressing   Problem: Clinical Measurements: Goal: Ability to maintain clinical measurements within normal limits will improve Outcome: Progressing Goal: Will remain free from infection Outcome: Progressing Goal: Diagnostic test results will improve Outcome: Progressing Goal: Respiratory complications will improve Outcome: Progressing Goal: Cardiovascular complication will be avoided Outcome: Progressing   Problem: Activity: Goal: Risk for activity intolerance will decrease Outcome: Progressing   Problem: Elimination: Goal: Will not experience complications related to bowel motility Outcome: Progressing Goal: Will not experience complications related to urinary retention Outcome: Progressing   Problem: Safety: Goal: Ability to remain free from injury will improve Outcome: Progressing   Problem: Skin Integrity: Goal: Risk for impaired skin integrity will decrease Outcome: Progressing   Problem: Education: Goal: Ability to demonstrate management of disease process will improve Outcome: Progressing Goal: Ability to verbalize understanding of medication therapies will improve Outcome: Progressing Goal: Individualized Educational Video(s) Outcome: Progressing   Problem: Activity: Goal: Capacity to carry out activities will improve Outcome: Progressing   Problem: Cardiac: Goal: Ability to achieve and maintain adequate cardiopulmonary perfusion will improve Outcome: Progressing   

## 2019-03-25 NOTE — Progress Notes (Signed)
Patient's wife is at the bedside.

## 2019-03-25 NOTE — Progress Notes (Signed)
PROGRESS NOTE    Gregory Werner  [id number] DOB: [birth year] DOA: 03/20/2019 PCP: [name], DO     Brief Narrative:  Gregory Werner is a 84 y.o. male with medical history significant of A. fib, hypertension, hyperlipidemia, prostate cancer treated with radiation being sent to the ED by his PCP for evaluation of an abnormal EKG. Patient states he saw his PCP yesterday and was advised to come into the ED as his EKG appeared abnormal.  He has been experiencing shortness of breath, orthopnea, and a cough for the past few days.  Denies chest pain. Work up in the ED was significant for BNP 3183, Chest x-ray showing cardiomegaly with vascular congestion and possible mild interstitial edema. Patient received IV Lasix 20 mg.  He was admitted for new onset CHF as well as new left bundle branch block on EKG.  Cardiology was consulted.  New events last 24 hours / Subjective: INR remains elevated.  Heart cath rescheduled for tomorrow morning.  Patient has no other physical complaints other than hunger.  Denies any chest pain or shortness of breath.  Assessment & Plan:   Principal Problem:   CHF (congestive heart failure) (HCC) Active Problems:   HYPERTENSION, BENIGN   Atrial fibrillation (HCC)   LBBB (left bundle branch block)   Elevated LFTs   New onset acute systolic heart failure -Admitted with chief complaint of dyspnea, orthopnea.  BNP 3183 on admission as well as chest x-ray showing cardiomegaly with vascular congestion and possible mild interstitial edema -Strict I's and O's, daily weight (these have not been measured accurately) -Echocardiogram EF 30 to 35% -Cardiology following, heart cath planned for tomorrow -IV Lasix -1.9L urine output recorded from yesterday   Permanent atrial fibrillation -On Coumadin as an outpatient, hold due to supratherapeutic INR -IV heparin once INR < 2  LBBB -Cardiology following, heart cath planned for tomorrow  Essential hypertension -Hold  ramipril -Lasix  Leukocytosis -Unclear etiology.  No clear sign of any infectious process.  Afebrile.  Continue to monitor CBC.  Hypokalemia -Replace, trend  Elevated liver enzyme -Right upper quadrant ultrasound unremarkable -Trending down   DVT prophylaxis: On Coumadin as an outpatient Code Status: DNR Family Communication: No family at bedside Disposition Plan: Heart cath planned for tomorrow    Consultants:   Cardiology  Procedures:   None  Antimicrobials:  Anti-infectives (From admission, onward)   None       Objective: Vitals:   03/24/19 1413 03/24/19 2007 03/25/19 0022 03/25/19 0423  BP: 140/83 (!) 152/81 124/90 (!) 141/90  Pulse: 60 (!) 40 67 67  Resp: 20 18 18 18   Temp: 97.9 F (36.6 C) 98.3 F (36.8 C) 97.7 F (36.5 C) 98.6 F (37 C)  TempSrc: Oral Oral Oral Oral  SpO2: 93% 96% 93% 96%  Weight:    62 kg  Height:        Intake/Output Summary (Last 24 hours) at 03/25/2019 0923 Last data filed at 03/25/2019 0830 Gross per 24 hour  Intake 780 ml  Output 1901 ml  Net -1121 ml   Filed Weights   03/23/19 0443 03/24/19 0406 03/25/19 0423  Weight: 64.5 kg 63.2 kg 62 kg    Examination: General exam: Appears calm and comfortable  Respiratory system: Diminished left base, clear otherwise.  No respiratory distress or increase in effort.  Cardiovascular system: S1 & S2 heard, irregular rhythm. No pedal edema. Gastrointestinal system: Abdomen is nondistended, soft and nontender. Normal bowel sounds heard. Central nervous system: Alert  and oriented. Non focal exam. Speech clear  Extremities: Symmetric in appearance bilaterally  Skin: No rashes, lesions or ulcers on exposed skin  Psychiatry: Judgement and insight appear stable. Mood & affect appropriate.   Data Reviewed: I have personally reviewed following labs and imaging studies  CBC: Recent Labs  Lab 03/20/19 2210 03/21/19 0351 03/22/19 0410 03/23/19 0330 03/24/19 0425 03/25/19 0509    WBC 11.5* 11.1* 9.7 11.1* 13.4* 15.2*  NEUTROABS 9.3*  --   --   --   --   --   HGB 14.0 13.3 12.7* 14.0 14.1 14.6  HCT 41.8 40.9 38.5* 41.1 43.3 45.7  MCV 96.1 95.8 96.7 93.8 96.2 97.9  PLT 280 281 254 288 298 [id number]   Basic Metabolic Panel: Recent Labs  Lab 03/21/19 1157 03/22/19 0410 03/22/19 1118 03/23/19 0330 03/24/19 0425 03/25/19 0509  NA 136 138  --  139 142 146*  K 4.1 3.5  --  3.6 3.3* 3.4*  CL 98 99  --  99 101 104  CO2 25 27  --  27 29 31   GLUCOSE 164* 101*  --  128* 129* 122*  BUN 36* 35*  --  33* 37* 41*  CREATININE 1.14 1.08  --  1.30* 1.08 1.22  CALCIUM 8.7* 8.5*  --  8.8* 8.9 8.9  MG  --   --  1.9  --   --   --    GFR: Estimated Creatinine Clearance: 33.9 mL/min (by C-G formula based on SCr of 1.22 mg/dL). Liver Function Tests: Recent Labs  Lab 03/20/19 2210 03/21/19 0351 03/22/19 0410 03/23/19 0330 03/24/19 0425  AST 88* 78* 57* 42* 29  ALT 104* 99* 75* 65* 49*  ALKPHOS 135* 127* 111 112 101  BILITOT 0.9 1.4* 1.2 1.2 1.3*  PROT 6.4* 5.8* 5.2* 6.0* 5.6*  ALBUMIN 3.4* 3.1* 2.7* 2.9* 2.7*   No results for input(s): LIPASE, AMYLASE in the last 168 hours. No results for input(s): AMMONIA in the last 168 hours. Coagulation Profile: Recent Labs  Lab 03/21/19 0351 03/22/19 0410 03/23/19 0330 03/24/19 0425 03/25/19 0509  INR 3.9* 3.8* 3.3* 3.2* 2.8*   Cardiac Enzymes: No results for input(s): CKTOTAL, CKMB, CKMBINDEX, TROPONINI in the last 168 hours. BNP (last 3 results) No results for input(s): PROBNP in the last 8760 hours. HbA1C: No results for input(s): HGBA1C in the last 72 hours. CBG: No results for input(s): GLUCAP in the last 168 hours. Lipid Profile: No results for input(s): CHOL, HDL, LDLCALC, TRIG, CHOLHDL, LDLDIRECT in the last 72 hours. Thyroid Function Tests: No results for input(s): TSH, T4TOTAL, FREET4, T3FREE, THYROIDAB in the last 72 hours. Anemia Panel: No results for input(s): VITAMINB12, FOLATE, FERRITIN, TIBC, IRON,  RETICCTPCT in the last 72 hours. Sepsis Labs: No results for input(s): PROCALCITON, LATICACIDVEN in the last 168 hours.  Recent Results (from the past 240 hour(s))  SARS CORONAVIRUS 2 (TAT 6-24 HRS) Nasopharyngeal Nasopharyngeal Swab     Status: None   Collection Time: 03/21/19  4:34 AM   Specimen: Nasopharyngeal Swab  Result Value Ref Range Status   SARS Coronavirus 2 NEGATIVE NEGATIVE Final    Comment: (NOTE) SARS-CoV-2 target nucleic acids are NOT DETECTED. The SARS-CoV-2 RNA is generally detectable in upper and lower respiratory specimens during the acute phase of infection. Negative results do not preclude SARS-CoV-2 infection, do not rule out co-infections with other pathogens, and should not be used as the sole basis for treatment or other patient management decisions. Negative results must be  combined with clinical observations, patient history, and epidemiological information. The expected result is Negative. Fact Sheet for Patients: SugarRoll.be Fact Sheet for Healthcare Providers: https://www.woods-mathews.com/ This test is not yet approved or cleared by the [place] FDA and  has been authorized for detection and/or diagnosis of SARS-CoV-2 by FDA under an Emergency Use Authorization (EUA). This EUA will remain  in effect (meaning this test can be used) for the duration of the COVID-19 declaration under Section 56 4(b)(1) of the Act, 21 U.S.C. section 360bbb-3(b)(1), unless the authorization is terminated or revoked sooner. Performed at [facility], [place] [address] [place], [place] [address]   MRSA PCR Screening     Status: None   Collection Time: 03/24/19 10:58 PM   Specimen: Nasopharyngeal  Result Value Ref Range Status   MRSA by PCR NEGATIVE NEGATIVE Final    Comment:        The GeneXpert MRSA Assay (FDA approved for NASAL specimens only), is one component of a comprehensive MRSA colonization surveillance  program. It is not intended to diagnose MRSA infection nor to guide or monitor treatment for MRSA infections. Performed at [facility], [place] [address], [place], [place] [address]       Radiology Studies: No results found.    Scheduled Meds: . aspirin  81 mg Oral Pre-Cath  . furosemide  20 mg Intravenous BID  . sodium chloride flush  3 mL Intravenous Q12H  . sodium chloride flush  3 mL Intravenous Q12H   Continuous Infusions: . sodium chloride    . sodium chloride    . sodium chloride 10 mL/hr at 03/25/19 0013     LOS: 4 days      Time spent: 25 minutes   Dessa Phi, DO [facility] 03/25/2019, 9:23 AM   Available via Epic secure chat 7am-7pm After these hours, please refer to coverage provider listed on [email or web address]

## 2019-03-25 NOTE — Progress Notes (Signed)
[place] for warfarin Indication: atrial fibrillation  Allergies  Allergen Reactions  . Cephalexin Diarrhea    Patient Measurements: Height: 5\' 10"  (177.8 cm) Weight: 136 lb 11.2 oz (62 kg) IBW/kg (Calculated) : 73 Heparin Dosing Weight: 72.8 kg   Vital Signs: Temp: 98.6 F (37 C) (12/21 0423) Temp Source: Oral (12/21 0423) BP: 141/90 (12/21 0423) Pulse Rate: 67 (12/21 0423)  Labs: Recent Labs    03/23/19 0330 03/24/19 0425 03/25/19 0509  HGB 14.0 14.1 14.6  HCT 41.1 43.3 45.7  PLT 288 298 290  LABPROT 33.3* 32.4* 29.2*  INR 3.3* 3.2* 2.8*  CREATININE 1.30* 1.08 1.22    Estimated Creatinine Clearance: 33.9 mL/min (by C-G formula based on SCr of 1.22 mg/dL).   Medical History: Past Medical History:  Diagnosis Date  . Arthritis   . Atrial fibrillation ([place])   . Diverticulosis    history  . Dysphagia 2001   history s/p esophageal dilatation  . Dysrhythmia    hx. Atrial Fibrillation, RBBB  . GERD (gastroesophageal reflux disease)   . Hearing loss    bilateral hearing aids  . Hyperlipidemia   . Hypertension    benign  . Lung infiltrate 05/2010   left lower lobe infiltrate, questionable pneumonia  . Prostate cancer ([place]) 2001   treated with radiation    Medications:  Scheduled:  . aspirin  81 mg Oral Pre-Cath  . furosemide  20 mg Intravenous BID  . potassium chloride  40 mEq Oral Once  . sodium chloride flush  3 mL Intravenous Q12H  . sodium chloride flush  3 mL Intravenous Q12H    Assessment: 89 yom presenting with acute heart failure exacerbation. On warfarin PTA for hx Afib - PTA regimen 5 mg daily except 2.5 mg MWF (LD 12/16 at 0700). INR on admit 3.8.   Plan for heparin infusion once INR<2. INR today is 2.8 - no warfarin given this admission. CBC stable. No s/sx of bleeding.   Goal of Therapy:  INR 2-3 Monitor platelets by anticoagulation protocol: Yes   Plan:  Start heparin infusion once INR<2   Continue to hold warfarin today Monitor daily INR and CBC Monitor s/sx of bleeding  Antonietta Jewel, PharmD, BCCCP Clinical Pharmacist  Phone: [phone]  Please check AMION for all [place] phone numbers After 10:00 PM, call [place] [phone] 03/25/2019  8:13 AM

## 2019-03-25 NOTE — Progress Notes (Addendum)
Progress Note  Patient Name: Gregory Werner Date of Encounter: 03/25/2019  Primary Cardiologist: Minus Breeding, MD   Subjective   No chest pain or dyspnea. Cath rescheduled to tomorrow due to INR of 2.8.   Inpatient Medications    Scheduled Meds: . aspirin  81 mg Oral Pre-Cath  . furosemide  20 mg Intravenous BID  . sodium chloride flush  3 mL Intravenous Q12H  . sodium chloride flush  3 mL Intravenous Q12H   Continuous Infusions: . sodium chloride    . sodium chloride    . sodium chloride 10 mL/hr at 03/25/19 0013   PRN Meds: sodium chloride, sodium chloride, acetaminophen, docusate sodium, senna, sodium chloride flush, sodium chloride flush   Vital Signs    Vitals:   03/24/19 1413 03/24/19 2007 03/25/19 0022 03/25/19 0423  BP: 140/83 (!) 152/81 124/90 (!) 141/90  Pulse: 60 (!) 40 67 67  Resp: 20 18 18 18   Temp: [id number] F (36.6 C) 98.3 F (36.8 C) 97.7 F (36.5 C) 98.6 F (37 C)  TempSrc: Oral Oral Oral Oral  SpO2: 93% 96% 93% 96%  Weight:    62 kg  Height:        Intake/Output Summary (Last 24 hours) at 03/25/2019 [id number] Last data filed at 03/25/2019 0830 Gross per 24 hour  Intake 780 ml  Output 1901 ml  Net -1121 ml   Last 3 Weights 03/25/2019 03/24/2019 03/23/2019  Weight (lbs) 136 lb 11.2 oz 139 lb 6.4 oz 142 lb 3.2 oz  Weight (kg) 62.007 kg 63.231 kg 64.501 kg      Telemetry    Atrial fibrillation at rate of 80s with PVCs- Personally Reviewed  ECG    N/A  Physical Exam   GEN: Elderly male in no acute distress.   Neck: No JVD Cardiac: IR IR , no murmurs, rubs, or gallops.  Respiratory: Clear to auscultation bilaterally. GI: Soft, nontender, non-distended  MS: No edema; No deformity. Neuro:  Nonfocal  Psych: Normal affect   Labs    High Sensitivity Troponin:   Recent Labs  Lab 03/21/19 0200 03/21/19 0351  TROPONINIHS 103* 100*      Chemistry Recent Labs  Lab 03/22/19 0410 03/23/19 0330 03/24/19 0425 03/25/19 0509  NA 138  139 142 146*  K 3.5 3.6 3.3* 3.4*  CL 99 99 101 104  CO2 27 27 29 31   GLUCOSE 101* 128* 129* 122*  BUN 35* 33* 37* 41*  CREATININE 1.08 1.30* 1.08 1.22  CALCIUM 8.5* 8.8* 8.9 8.9  PROT 5.2* 6.0* 5.6*  --   ALBUMIN 2.7* 2.9* 2.7*  --   AST 57* 42* 29  --   ALT 75* 65* 49*  --   ALKPHOS 111 112 101  --   BILITOT 1.2 1.2 1.3*  --   GFRNONAA 59* 47* 59* 51*  GFRAA >60 55* >60 59*  ANIONGAP 12 13 12 11      Hematology Recent Labs  Lab 03/23/19 0330 03/24/19 0425 03/25/19 0509  WBC 11.1* 13.4* 15.2*  RBC 4.38 4.50 4.67  HGB 14.0 14.1 14.6  HCT 41.1 43.3 45.7  MCV 93.8 96.2 97.9  MCH 32.0 31.3 31.3  MCHC 34.1 32.6 31.9  RDW 13.2 13.4 13.5  PLT 288 298 290    BNP Recent Labs  Lab 03/20/19 2226  BNP 3,183.6*      Radiology    No results found.  Cardiac Studies   Echo12/17/2020 1. Left ventricular ejection fraction, by visual estimation,  is 30 to 35%. The left ventricle has severely decreased function. Left ventricular septal wall thickness was severely increased. Mildly increased left ventricular posterior wall thickness.  There is mildly increased left ventricular hypertrophy. 2. Abnormal septal motion consistent with left bundle branch block. 3. Left ventricular diastolic function could not be evaluated. 4. The left ventricle demonstrates global hypokinesis. 5. Global right ventricle has moderately reduced systolic function.The right ventricular size is normal. No increase in right ventricular wall thickness. 6. Left atrial size was severely dilated. 7. Right atrial size was not assessed. 8. The mitral valve is abnormal. Trivial mitral valve regurgitation. 9. The tricuspid valve is grossly normal. Tricuspid valve regurgitation is mild. 10. The aortic valve is tricuspid. Aortic valve regurgitation is mild. Mild aortic valve sclerosis without stenosis. 11. The pulmonic valve was grossly normal. Pulmonic valve regurgitation is trivial. 12. Aortic  dilatation noted. 13. There is mild dilatation of the ascending aorta and of the aortic root measuring 40 mm. 14. Moderately elevated pulmonary artery systolic pressure. 15. The tricuspid regurgitant velocity is 3.40 m/s, and with an assumed right atrial pressure of 15 mmHg, the estimated right ventricular systolic pressure is moderately elevated at 61.2 mmHg. 16. The inferior vena cava is dilated in size with <50% respiratory variability, suggesting right atrial pressure of 15 mmHg.  Patient Profile     85 y.o. male with a hx of permanent Afib on coumadin, HTN, HLD, prostate cancerseen for new onset CHF.   Assessment & Plan    1. Acute combined CHF - BNP 3183. Echo showed LVEF of 30-35% (was 60-65% in 2016). R & L cath rescheduled to tomorrow due to INR of 2.8 today. It was 3.8 three days ago. Could consider vit K 2.5mg  (defer to MD). Close to Euvolemic. Consider changing lasix to PO tomorrow. Consider adding BB but slow rate initially.  Ramipril on hold but could consider ARB vs Entresto. Will review medications with MD.   2. Hypokalemic - Supplement given  3. HTN - As above  4. Persistent afib - HR in 70-80s. Not on any rate control agent at baseline.  - Coumadin on hold for cath. Plan to start heparin once INR less than 2.   5. LBBB - New. Plan for cath as above.    For questions or updates, please contact [place] Please consult [email or web address] for contact info under        Signed, Leanor Kail, PA  03/25/2019, 9:09 AM    History and all data above reviewed.  Patient examined.  I agree with the findings as above.  He denies pain or SOB.  No distressThe patient exam reveals COR:RRR  ,  Lungs: Clear  ,  Abd: Positive bowel sounds, no rebound no guarding, Ext No edemqa  .  All available labs, radiology testing, previous records reviewed. Agree with documented assessment and plan. New acute systolic HF:  Plan cath in the AM.  I will give vit K.  Atrial fib:  Rate  controlled.  Holding anticoagulation.  LBBB.  Plan for cath as above.   Jeneen Rinks [facility]  10:38 AM  03/25/2019

## 2019-03-25 NOTE — Progress Notes (Signed)
Spoke to patient's wife about questions in regard to her husband's condition and upcoming procedure that is scheduled for tomorrow.

## 2019-03-26 ENCOUNTER — Encounter (HOSPITAL_COMMUNITY)
Admission: EM | Disposition: A | Discharge status: Hospice | Payer: Self-pay | Source: Home / Self Care | Attending: Internal Medicine

## 2019-03-26 DIAGNOSIS — I2510 Atherosclerotic heart disease of native coronary artery without angina pectoris: Secondary | ICD-10-CM

## 2019-03-26 DIAGNOSIS — I10 Essential (primary) hypertension: Secondary | ICD-10-CM

## 2019-03-26 DIAGNOSIS — R0602 Shortness of breath: Secondary | ICD-10-CM

## 2019-03-26 DIAGNOSIS — I4819 Other persistent atrial fibrillation: Secondary | ICD-10-CM

## 2019-03-26 DIAGNOSIS — E876 Hypokalemia: Secondary | ICD-10-CM

## 2019-03-26 HISTORY — PX: RIGHT/LEFT HEART CATH AND CORONARY ANGIOGRAPHY: CATH118266

## 2019-03-26 LAB — URINALYSIS, COMPLETE (UACMP) WITH MICROSCOPIC
Bilirubin Urine: NEGATIVE
Glucose, UA: NEGATIVE mg/dL
Ketones, ur: NEGATIVE mg/dL
Nitrite: NEGATIVE
Protein, ur: >=300 mg/dL — AB
RBC / HPF: >50 RBC/hpf — ABNORMAL HIGH (ref 0–5)
Specific Gravity, Urine: 1.028 (ref 1.005–1.030)
pH: 9 — ABNORMAL HIGH (ref 5.0–8.0)

## 2019-03-26 LAB — POCT I-STAT EG7
Acid-Base Excess: 10 mmol/L — ABNORMAL HIGH (ref 0.0–2.0)
Acid-Base Excess: 9 mmol/L — ABNORMAL HIGH (ref 0.0–2.0)
Bicarbonate: 33.7 mmol/L — ABNORMAL HIGH (ref 20.0–28.0)
Bicarbonate: 34.9 mmol/L — ABNORMAL HIGH (ref 20.0–28.0)
Calcium, Ion: 1.09 mmol/L — ABNORMAL LOW (ref 1.15–1.40)
Calcium, Ion: 1.17 mmol/L (ref 1.15–1.40)
HCT: 46 % (ref 39.0–52.0)
HCT: 46 % (ref 39.0–52.0)
Hemoglobin: 15.6 g/dL (ref 13.0–17.0)
Hemoglobin: 15.6 g/dL (ref 13.0–17.0)
O2 Saturation: 73 %
O2 Saturation: 75 %
Potassium: 3.8 mmol/L (ref 3.5–5.1)
Potassium: 4 mmol/L (ref 3.5–5.1)
Sodium: 149 mmol/L — ABNORMAL HIGH (ref 135–145)
Sodium: 149 mmol/L — ABNORMAL HIGH (ref 135–145)
TCO2: 35 mmol/L — ABNORMAL HIGH (ref 22–32)
TCO2: 36 mmol/L — ABNORMAL HIGH (ref 22–32)
pCO2, Ven: 45.3 mmHg (ref 44.0–60.0)
pCO2, Ven: 46.7 mmHg (ref 44.0–60.0)
pH, Ven: 7.48 — ABNORMAL HIGH (ref 7.250–7.430)
pH, Ven: 7.481 — ABNORMAL HIGH (ref 7.250–7.430)
pO2, Ven: 36 mmHg (ref 32.0–45.0)
pO2, Ven: 37 mmHg (ref 32.0–45.0)

## 2019-03-26 LAB — BASIC METABOLIC PANEL
Anion gap: 14 (ref 5–15)
BUN: 45 mg/dL — ABNORMAL HIGH (ref 8–23)
CO2: 30 mmol/L (ref 22–32)
Calcium: 9.2 mg/dL (ref 8.9–10.3)
Chloride: 104 mmol/L (ref 98–111)
Creatinine, Ser: 1.32 mg/dL — ABNORMAL HIGH (ref 0.61–1.24)
GFR calc Af Amer: 54 mL/min — ABNORMAL LOW (ref >60)
GFR calc non Af Amer: 47 mL/min — ABNORMAL LOW (ref >60)
Glucose, Bld: 128 mg/dL — ABNORMAL HIGH (ref 70–99)
Potassium: 3.3 mmol/L — ABNORMAL LOW (ref 3.5–5.1)
Sodium: 148 mmol/L — ABNORMAL HIGH (ref 135–145)

## 2019-03-26 LAB — POCT I-STAT 7, (LYTES, BLD GAS, ICA,H+H)
Acid-Base Excess: 9 mmol/L — ABNORMAL HIGH (ref 0.0–2.0)
Bicarbonate: 33.3 mmol/L — ABNORMAL HIGH (ref 20.0–28.0)
Calcium, Ion: 1.11 mmol/L — ABNORMAL LOW (ref 1.15–1.40)
HCT: 45 % (ref 39.0–52.0)
Hemoglobin: 15.3 g/dL (ref 13.0–17.0)
O2 Saturation: 99 %
Potassium: 3.8 mmol/L (ref 3.5–5.1)
Sodium: 149 mmol/L — ABNORMAL HIGH (ref 135–145)
TCO2: 35 mmol/L — ABNORMAL HIGH (ref 22–32)
pCO2 arterial: 41 mmHg (ref 32.0–48.0)
pH, Arterial: 7.517 — ABNORMAL HIGH (ref 7.350–7.450)
pO2, Arterial: 142 mmHg — ABNORMAL HIGH (ref 83.0–108.0)

## 2019-03-26 LAB — CBC WITH DIFFERENTIAL/PLATELET
Abs Immature Granulocytes: 0.1 10*3/uL — ABNORMAL HIGH (ref 0.00–0.07)
Basophils Absolute: 0 10*3/uL (ref 0.0–0.1)
Basophils Relative: 0 %
Eosinophils Absolute: 0 10*3/uL (ref 0.0–0.5)
Eosinophils Relative: 0 %
HCT: 49.4 % (ref 39.0–52.0)
Hemoglobin: 16 g/dL (ref 13.0–17.0)
Immature Granulocytes: 1 %
Lymphocytes Relative: 7 %
Lymphs Abs: 1.3 10*3/uL (ref 0.7–4.0)
MCH: 31.5 pg (ref 26.0–34.0)
MCHC: 32.4 g/dL (ref 30.0–36.0)
MCV: 97.2 fL (ref 80.0–100.0)
Monocytes Absolute: 0.9 10*3/uL (ref 0.1–1.0)
Monocytes Relative: 5 %
Neutro Abs: 14.7 10*3/uL — ABNORMAL HIGH (ref 1.7–7.7)
Neutrophils Relative %: 87 %
Platelets: 309 10*3/uL (ref 150–400)
RBC: 5.08 MIL/uL (ref 4.22–5.81)
RDW: 13.5 % (ref 11.5–15.5)
WBC: 16.9 10*3/uL — ABNORMAL HIGH (ref 4.0–10.5)
nRBC: 0 % (ref 0.0–0.2)

## 2019-03-26 LAB — PROTIME-INR
INR: 1.5 — ABNORMAL HIGH (ref 0.8–1.2)
Prothrombin Time: 18.3 seconds — ABNORMAL HIGH (ref 11.4–15.2)

## 2019-03-26 LAB — MAGNESIUM: Magnesium: 2.2 mg/dL (ref 1.7–2.4)

## 2019-03-26 SURGERY — RIGHT/LEFT HEART CATH AND CORONARY ANGIOGRAPHY
Anesthesia: LOCAL

## 2019-03-26 MED ORDER — FUROSEMIDE 20 MG PO TABS: 20.0000 mg | ORAL_TABLET | Freq: Two times a day (BID) | ORAL | Status: DC

## 2019-03-26 MED ORDER — VERAPAMIL HCL 2.5 MG/ML IV SOLN
INTRAVENOUS | Status: DC | PRN
  Administered 2019-03-26: 10 mL via INTRA_ARTERIAL

## 2019-03-26 MED ORDER — HEPARIN (PORCINE) 25000 UT/250ML-% IV SOLN: 850.0000 [IU]/h | INTRAVENOUS | Status: DC

## 2019-03-26 MED ORDER — ATORVASTATIN CALCIUM 40 MG PO TABS
40.0000 mg | ORAL_TABLET | Freq: Every day | ORAL | Status: DC
  Administered 2019-03-26 – 2019-03-27 (×2): 40 mg via ORAL
  Filled 2019-03-26 (×2): qty 1

## 2019-03-26 MED ORDER — HEPARIN SODIUM (PORCINE) 1000 UNIT/ML IJ SOLN
INTRAMUSCULAR | Status: DC | PRN
  Administered 2019-03-26: 3000 [IU] via INTRAVENOUS

## 2019-03-26 MED ORDER — HEPARIN (PORCINE) IN NACL 1000-0.9 UT/500ML-% IV SOLN
INTRAVENOUS | Status: AC
  Filled 2019-03-26: qty 1000

## 2019-03-26 MED ORDER — LIDOCAINE HCL (PF) 1 % IJ SOLN
INTRAMUSCULAR | Status: DC | PRN
  Administered 2019-03-26: 2 mL
  Administered 2019-03-26: 1 mL

## 2019-03-26 MED ORDER — POTASSIUM CHLORIDE CRYS ER 20 MEQ PO TBCR
40.0000 meq | EXTENDED_RELEASE_TABLET | ORAL | Status: AC
  Administered 2019-03-26: 40 meq via ORAL
  Filled 2019-03-26: qty 2

## 2019-03-26 MED ORDER — MIDAZOLAM HCL 2 MG/2ML IJ SOLN
INTRAMUSCULAR | Status: AC
  Filled 2019-03-26: qty 2

## 2019-03-26 MED ORDER — SODIUM CHLORIDE 0.9% FLUSH: 3.0000 mL | INTRAVENOUS | Status: DC | PRN

## 2019-03-26 MED ORDER — SODIUM CHLORIDE 0.9% FLUSH
3.0000 mL | Freq: Two times a day (BID) | INTRAVENOUS | Status: DC
  Administered 2019-03-26 – 2019-03-27 (×3): 3 mL via INTRAVENOUS

## 2019-03-26 MED ORDER — LIDOCAINE HCL (PF) 1 % IJ SOLN
INTRAMUSCULAR | Status: AC
  Filled 2019-03-26: qty 30

## 2019-03-26 MED ORDER — SODIUM CHLORIDE 0.9 % IV SOLN: 250.0000 mL | INTRAVENOUS | Status: DC | PRN

## 2019-03-26 MED ORDER — IOHEXOL 350 MG/ML SOLN
INTRAVENOUS | Status: DC | PRN
  Administered 2019-03-26: 55 mL

## 2019-03-26 MED ORDER — WARFARIN SODIUM 3 MG PO TABS
6.0000 mg | ORAL_TABLET | Freq: Once | ORAL | Status: AC
  Administered 2019-03-26: 17:00:00 6 mg via ORAL
  Filled 2019-03-26: qty 2

## 2019-03-26 MED ORDER — HEPARIN (PORCINE) IN NACL 1000-0.9 UT/500ML-% IV SOLN
INTRAVENOUS | Status: DC | PRN
  Administered 2019-03-26: 500 mL

## 2019-03-26 MED ORDER — ACETAMINOPHEN 325 MG PO TABS: 650.0000 mg | ORAL_TABLET | ORAL | Status: DC | PRN

## 2019-03-26 MED ORDER — ASPIRIN 81 MG PO CHEW
81.0000 mg | CHEWABLE_TABLET | Freq: Every day | ORAL | Status: DC
  Administered 2019-03-27: 09:00:00 81 mg via ORAL
  Filled 2019-03-26 (×2): qty 1

## 2019-03-26 MED ORDER — MIDAZOLAM HCL 2 MG/2ML IJ SOLN
INTRAMUSCULAR | Status: DC | PRN
  Administered 2019-03-26 (×2): 0.5 mg via INTRAVENOUS

## 2019-03-26 MED ORDER — SODIUM CHLORIDE 0.9 % IV SOLN: INTRAVENOUS | Status: AC

## 2019-03-26 MED ORDER — VERAPAMIL HCL 2.5 MG/ML IV SOLN
INTRAVENOUS | Status: AC
  Filled 2019-03-26: qty 2

## 2019-03-26 MED ORDER — ASPIRIN 81 MG PO CHEW
81.0000 mg | CHEWABLE_TABLET | Freq: Once | ORAL | Status: AC
  Administered 2019-03-26: 81 mg via ORAL
  Filled 2019-03-26: qty 1

## 2019-03-26 MED ORDER — LABETALOL HCL 5 MG/ML IV SOLN: 10.0000 mg | INTRAVENOUS | Status: AC | PRN

## 2019-03-26 MED ORDER — FENTANYL CITRATE (PF) 100 MCG/2ML IJ SOLN
INTRAMUSCULAR | Status: DC | PRN
  Administered 2019-03-26 (×2): 12.5 ug via INTRAVENOUS

## 2019-03-26 MED ORDER — HYDRALAZINE HCL 20 MG/ML IJ SOLN: 10.0000 mg | INTRAMUSCULAR | Status: AC | PRN

## 2019-03-26 MED ORDER — CARVEDILOL 3.125 MG PO TABS
3.1250 mg | ORAL_TABLET | Freq: Two times a day (BID) | ORAL | Status: DC
  Administered 2019-03-26 – 2019-03-27 (×2): 3.125 mg via ORAL
  Filled 2019-03-26 (×2): qty 1

## 2019-03-26 MED ORDER — FENTANYL CITRATE (PF) 100 MCG/2ML IJ SOLN
INTRAMUSCULAR | Status: AC
  Filled 2019-03-26: qty 2

## 2019-03-26 MED ORDER — HEPARIN SODIUM (PORCINE) 1000 UNIT/ML IJ SOLN
INTRAMUSCULAR | Status: AC
  Filled 2019-03-26: qty 1

## 2019-03-26 MED ORDER — ONDANSETRON HCL 4 MG/2ML IJ SOLN: 4.0000 mg | Freq: Four times a day (QID) | INTRAMUSCULAR | Status: DC | PRN

## 2019-03-26 MED ORDER — WARFARIN - PHARMACIST DOSING INPATIENT: Freq: Every day | Status: DC

## 2019-03-26 SURGICAL SUPPLY — 13 items
CATH 5FR JL3.5 JR4 ANG PIG MP (CATHETERS) ×1 IMPLANT
CATH BALLN WEDGE 5F 110CM (CATHETERS) ×1 IMPLANT
CATH INFINITI 5FR JL4 (CATHETERS) ×1 IMPLANT
CATH OPTITORQUE TIG 4.0 5F (CATHETERS) ×1 IMPLANT
DEVICE RAD TR BAND REGULAR (VASCULAR PRODUCTS) ×1 IMPLANT
GLIDESHEATH SLEND SS 6F .021 (SHEATH) ×1 IMPLANT
GUIDEWIRE INQWIRE 1.5J.035X260 (WIRE) IMPLANT
INQWIRE 1.5J .035X260CM (WIRE) ×2
KIT HEART LEFT (KITS) ×2 IMPLANT
PACK CARDIAC CATHETERIZATION (CUSTOM PROCEDURE TRAY) ×2 IMPLANT
SHEATH GLIDE SLENDER 4/5FR (SHEATH) ×2 IMPLANT
TRANSDUCER W/STOPCOCK (MISCELLANEOUS) ×2 IMPLANT
TUBING CIL FLEX 10 FLL-RA (TUBING) ×2 IMPLANT

## 2019-03-26 NOTE — Progress Notes (Signed)
PROGRESS NOTE    Gregory Werner  [id number] DOB: 1924/09/06 DOA: 03/20/2019 PCP: [name], DO     Brief Narrative:  Gregory Werner is a 85 y.o. male with medical history significant of A. fib, hypertension, hyperlipidemia, prostate cancer treated with radiation being sent to the ED by his PCP for evaluation of an abnormal EKG. Patient states he saw his PCP yesterday and was advised to come into the ED as his EKG appeared abnormal.  He has been experiencing shortness of breath, orthopnea, and a cough for the past few days.  Denies chest pain. Work up in the ED was significant for BNP 3183, Chest x-ray showing cardiomegaly with vascular congestion and possible mild interstitial edema. Patient received IV Lasix 20 mg.  He was admitted for new onset CHF as well as new left bundle branch block on EKG.  Cardiology was consulted.  New events last 24 hours / Subjective: Underwent heart cath this morning. Wife at bedside, concerned about kidney function and decreased PO intake.   Assessment & Plan:   Principal Problem:   CHF (congestive heart failure) (HCC) Active Problems:   HYPERTENSION, BENIGN   Atrial fibrillation (HCC)   LBBB (left bundle branch block)   Elevated LFTs   Acute systolic HF (heart failure) (HCC)   Shortness of breath   New onset acute systolic heart failure -Admitted with chief complaint of dyspnea, orthopnea.  BNP 3183 on admission as well as chest x-ray showing cardiomegaly with vascular congestion and possible mild interstitial edema -Strict I's and O's, daily weight (these have not been measured accurately) -Echocardiogram EF 30 to 35% -Cardiology following, s/p heart cath -Coreg   -IV Lasix - hold today  -975 mL urine output recorded from yesterday with 4 unmeasured urine outputs as well   CAD -S/p heart cath -Aspirin, lipitor   Permanent atrial fibrillation -IV heparin and coumadin   Essential hypertension -Hold ramipril -Lasix on hold today    Leukocytosis -Unclear etiology.  No clear sign of any infectious process.  Afebrile.  Continue to monitor CBC.  Hypokalemia -Replace, trend  Elevated liver enzyme -Right upper quadrant ultrasound unremarkable -Trending down   DVT prophylaxis: IV heparin and coumadin  Code Status: DNR Family Communication: Wife at bedside  Disposition Plan: Medical management planned. Trend labs. PT eval    Consultants:   Cardiology  Procedures:   Heart cath 12/22   Antimicrobials:  Anti-infectives (From admission, onward)   None       Objective: Vitals:   03/26/19 1014 03/26/19 1019 03/26/19 1024 03/26/19 1029  BP: 130/86 127/65 122/77 130/67  Pulse: (!) 48 (!) 52 62 (!) 43  Resp: 17 (!) 23 (!) 26 18  Temp:      TempSrc:      SpO2: 96% 98% 96% 97%  Weight:      Height:        Intake/Output Summary (Last 24 hours) at 03/26/2019 1421 Last data filed at 03/26/2019 0900 Gross per 24 hour  Intake 647.02 ml  Output 575 ml  Net 72.02 ml   Filed Weights   03/24/19 0406 03/25/19 0423 03/26/19 0417  Weight: 63.2 kg 62 kg 60.5 kg    Examination: General exam: Appears calm and comfortable  Respiratory system: Clear to auscultation. Respiratory effort normal. On room air  Cardiovascular system: S1 & S2 heard, irreg rhythm. No pedal edema. Gastrointestinal system: Abdomen is nondistended, soft and nontender. Normal bowel sounds heard. Central nervous system: Alert and oriented. Non focal  exam. Speech clear  Extremities: Symmetric in appearance bilaterally  Skin: No rashes, lesions or ulcers on exposed skin  Psychiatry: Judgement and insight appear stable. Mood & affect appropriate.    Data Reviewed: I have personally reviewed following labs and imaging studies  CBC: Recent Labs  Lab 03/20/19 2210 03/22/19 0410 03/23/19 0330 03/24/19 0425 03/25/19 0509 03/26/19 0433  WBC 11.5* 9.7 11.1* 13.4* 15.2* 16.9*  NEUTROABS 9.3*  --   --   --   --  14.7*  HGB 14.0 12.7* 14.0  14.1 14.6 16.0  HCT 41.8 38.5* 41.1 43.3 45.7 49.4  MCV 96.1 96.7 93.8 96.2 97.9 97.2  PLT 280 254 288 298 290 [id number]   Basic Metabolic Panel: Recent Labs  Lab 03/22/19 0410 03/22/19 1118 03/23/19 0330 03/24/19 0425 03/25/19 0509 03/26/19 0433  NA 138  --  139 142 146* 148*  K 3.5  --  3.6 3.3* 3.4* 3.3*  CL 99  --  99 101 104 104  CO2 27  --  27 29 31 30   GLUCOSE 101*  --  128* 129* 122* 128*  BUN 35*  --  33* 37* 41* 45*  CREATININE 1.08  --  1.30* 1.08 1.22 1.32*  CALCIUM 8.5*  --  8.8* 8.9 8.9 9.2  MG  --  1.9  --   --   --  2.2   GFR: Estimated Creatinine Clearance: 30.6 mL/min (A) (by C-G formula based on SCr of 1.32 mg/dL (H)). Liver Function Tests: Recent Labs  Lab 03/20/19 2210 03/21/19 0351 03/22/19 0410 03/23/19 0330 03/24/19 0425  AST 88* 78* 57* 42* 29  ALT 104* 99* 75* 65* 49*  ALKPHOS 135* 127* 111 112 101  BILITOT 0.9 1.4* 1.2 1.2 1.3*  PROT 6.4* 5.8* 5.2* 6.0* 5.6*  ALBUMIN 3.4* 3.1* 2.7* 2.9* 2.7*   No results for input(s): LIPASE, AMYLASE in the last 168 hours. No results for input(s): AMMONIA in the last 168 hours. Coagulation Profile: Recent Labs  Lab 03/22/19 0410 03/23/19 0330 03/24/19 0425 03/25/19 0509 03/26/19 0433  INR 3.8* 3.3* 3.2* 2.8* 1.5*   Cardiac Enzymes: No results for input(s): CKTOTAL, CKMB, CKMBINDEX, TROPONINI in the last 168 hours. BNP (last 3 results) No results for input(s): PROBNP in the last 8760 hours. HbA1C: No results for input(s): HGBA1C in the last 72 hours. CBG: No results for input(s): GLUCAP in the last 168 hours. Lipid Profile: No results for input(s): CHOL, HDL, LDLCALC, TRIG, CHOLHDL, LDLDIRECT in the last 72 hours. Thyroid Function Tests: No results for input(s): TSH, T4TOTAL, FREET4, T3FREE, THYROIDAB in the last 72 hours. Anemia Panel: No results for input(s): VITAMINB12, FOLATE, FERRITIN, TIBC, IRON, RETICCTPCT in the last 72 hours. Sepsis Labs: No results for input(s): PROCALCITON,  LATICACIDVEN in the last 168 hours.  Recent Results (from the past 240 hour(s))  SARS CORONAVIRUS 2 (TAT 6-24 HRS) Nasopharyngeal Nasopharyngeal Swab     Status: None   Collection Time: 03/21/19  4:34 AM   Specimen: Nasopharyngeal Swab  Result Value Ref Range Status   SARS Coronavirus 2 NEGATIVE NEGATIVE Final    Comment: (NOTE) SARS-CoV-2 target nucleic acids are NOT DETECTED. The SARS-CoV-2 RNA is generally detectable in upper and lower respiratory specimens during the acute phase of infection. Negative results do not preclude SARS-CoV-2 infection, do not rule out co-infections with other pathogens, and should not be used as the sole basis for treatment or other patient management decisions. Negative results must be combined with clinical observations, patient history,  and epidemiological information. The expected result is Negative. Fact Sheet for Patients: SugarRoll.be Fact Sheet for Healthcare Providers: https://www.woods-mathews.com/ This test is not yet approved or cleared by the [place] FDA and  has been authorized for detection and/or diagnosis of SARS-CoV-2 by FDA under an Emergency Use Authorization (EUA). This EUA will remain  in effect (meaning this test can be used) for the duration of the COVID-19 declaration under Section 56 4(b)(1) of the Act, 21 U.S.C. section 360bbb-3(b)(1), unless the authorization is terminated or revoked sooner. Performed at [facility], [place] [address], [place], [place] [address]   MRSA PCR Screening     Status: None   Collection Time: 03/24/19 10:58 PM   Specimen: Nasopharyngeal  Result Value Ref Range Status   MRSA by PCR NEGATIVE NEGATIVE Final    Comment:        The GeneXpert MRSA Assay (FDA approved for NASAL specimens only), is one component of a comprehensive MRSA colonization surveillance program. It is not intended to diagnose MRSA infection nor to guide or monitor  treatment for MRSA infections. Performed at [facility], [place] [address] [place], [place] [address]       Radiology Studies: CARDIAC CATHETERIZATION  Result Date: 03/26/2019  Mid RCA lesion is 85% stenosed.  Prox RCA lesion is 45% stenosed.  Prox RCA to Mid RCA lesion is 40% stenosed.  1st Mrg lesion is 100% stenosed.  Dist RCA lesion is 30% stenosed.  LV end diastolic pressure is low.  Lat 1st Diag-1 lesion is 99% stenosed.  Lat 1st Diag-2 lesion is 100% stenosed.  Significant coronary calcification and multivessel CAD with subtotal large first diagonal stenosis/occlusion with faint retrograde collateralization from the distal LAD and distal RCA; totally occluded circumflex marginal vessel; very large dominant calcified RCA with 40 to 50% smooth proximal stenosis, 40% proximal to mid stenosis, and evidence for 85% stenosis with severe eccentricity secondary to significant calcification proximal to the acute margin followed by 30% distal stenosis before large PDA and PLA vessel. Very mild increase in pulmonary artery systolic pressure. Previous echo documentation of EF 30 to 35%. RECOMMENDATION: We will gently hydrate post cath procedure with his creatinine clearance around 30.  Consider initiation of low-dose carvedilol.  In light of age of 48 years consider initial medical therapy trial but and if increasing symptomatology intervention to the RCA would be difficult due to vessel tortuosity and significant calcification and would require atherectomy.      Scheduled Meds: . [START ON 03/27/2019] aspirin  81 mg Oral Daily  . atorvastatin  40 mg Oral q1800  . carvedilol  3.125 mg Oral BID WC  . sodium chloride flush  3 mL Intravenous Q12H  . sodium chloride flush  3 mL Intravenous Q12H  . sodium chloride flush  3 mL Intravenous Q12H  . warfarin  6 mg Oral ONCE-1800  . Warfarin - Pharmacist Dosing Inpatient   Does not apply q1800   Continuous Infusions: . sodium chloride    .  sodium chloride 50 mL/hr at 03/26/19 1115  . sodium chloride    . heparin       LOS: 5 days      Time spent: 25 minutes   Dessa Phi, DO [facility] 03/26/2019, 2:21 PM   Available via Epic secure chat 7am-7pm After these hours, please refer to coverage provider listed on [email or web address]

## 2019-03-26 NOTE — Interval H&P Note (Signed)
Cath Lab Visit (complete for each Cath Lab visit)  Clinical Evaluation Leading to the Procedure:   ACS: No.  Non-ACS:    Anginal Classification: CCS II  Anti-ischemic medical therapy: No Therapy  Non-Invasive Test Results: No non-invasive testing performed  Prior CABG: No previous CABG      History and Physical Interval Note:  03/26/2019 9:34 AM  Gregory Werner  has presented today for surgery, with the diagnosis of Cardiomyopathy.  The various methods of treatment have been discussed with the patient and family. After consideration of risks, benefits and other options for treatment, the patient has consented to  Procedure(s): RIGHT/LEFT HEART CATH AND CORONARY ANGIOGRAPHY (N/A) as a surgical intervention.  The patient's history has been reviewed, patient examined, no change in status, stable for surgery.  I have reviewed the patient's chart and labs.  Questions were answered to the patient's satisfaction.     Shelva Majestic

## 2019-03-26 NOTE — Progress Notes (Signed)
Cath lab reported that patient still on schedule for 0730 but would need to talk to [facility] to find out about INR level.

## 2019-03-26 NOTE — Progress Notes (Signed)
Per cath lab RN, dissolve potassium in small med cup of water to give since patient cannot swallow whole.

## 2019-03-26 NOTE — Progress Notes (Signed)
   RN called re: hematuria  Will ck UA, IM MD to address results  Spoke w/ Dr Marlou Porch. Since pt has not had CVA, ok to dc heparin. Allow INR to drift up.   Check INR in am. Continue to monitor for bleeding.  Rosaria Ferries, PA-C 03/26/2019 7:55 PM [facility] [phone]

## 2019-03-26 NOTE — Progress Notes (Addendum)
Paged cardiology PA regarding start of IV heparin at 2030 and patient with blood tinged urine.  Cardiology returned page and placed orders for UA and to hold heparin until morning and updated labs in the morning.

## 2019-03-26 NOTE — Progress Notes (Signed)
Patient wife has wedding ring.  Patient removed from tele and heading for cath.

## 2019-03-26 NOTE — Progress Notes (Addendum)
[facility] [place] for warfarin Indication: atrial fibrillation  Allergies  Allergen Reactions  . Cephalexin Diarrhea    Patient Measurements: Height: 5\' 10"  (177.8 cm) Weight: 133 lb 4.8 oz (60.5 kg) IBW/kg (Calculated) : 73 Heparin Dosing Weight: 72.8 kg   Vital Signs: Temp: 97.3 F (36.3 C) (12/22 0408) Temp Source: Oral (12/22 0408) BP: 126/75 (12/22 0408) Pulse Rate: 81 (12/22 0408)  Labs: Recent Labs    03/24/19 0425 03/25/19 0509 03/26/19 0433  HGB 14.1 14.6 16.0  HCT 43.3 45.7 49.4  PLT 298 290 309  LABPROT 32.4* 29.2* 18.3*  INR 3.2* 2.8* 1.5*  CREATININE 1.08 1.22 1.32*    Estimated Creatinine Clearance: 30.6 mL/min (A) (by C-G formula based on SCr of 1.32 mg/dL (H)).   Medical History: Past Medical History:  Diagnosis Date  . Arthritis   . Atrial fibrillation ([place])   . Diverticulosis    history  . Dysphagia 2001   history s/p esophageal dilatation  . Dysrhythmia    hx. Atrial Fibrillation, RBBB  . GERD (gastroesophageal reflux disease)   . Hearing loss    bilateral hearing aids  . Hyperlipidemia   . Hypertension    benign  . Lung infiltrate 05/2010   left lower lobe infiltrate, questionable pneumonia  . Prostate cancer ([place]) 2001   treated with radiation    Medications:  Scheduled:  . furosemide  20 mg Intravenous BID  . potassium chloride  40 mEq Oral Q2H  . sodium chloride flush  3 mL Intravenous Q12H  . sodium chloride flush  3 mL Intravenous Q12H    Assessment: 39 yom presenting with acute heart failure exacerbation. On warfarin PTA for hx Afib - PTA regimen 5 mg daily except 2.5 mg MWF (LD 12/16 at 0700). INR on admit 3.8.   Plan for heparin infusion once INR<2. INR today is 1.5 after receiving 2.5 mg vitamin K PO - no warfarin given this admission. CBC stable. No s/sx of bleeding. Plan for cardiac cath on 12/22@0730 .   Goal of Therapy:  INR 2-3 Monitor platelets by anticoagulation protocol:  Yes   Plan:  Start heparin infusion at 850 units/hr >> patient is actively being transported to cath lab, will follow up after cath for anticoagulation plans  Monitor daily INR and CBC Monitor s/sx of bleeding  Antonietta Jewel, PharmD, BCCCP Clinical Pharmacist  Phone: [phone]  Please check AMION for all [place] phone numbers After 10:00 PM, call [place] [phone] 03/26/2019  7:17 AM  ADDENDUM  Cath note showing significant multivessel CAD - discussed with Dr Claiborne Billings and plan to start heparin infusion at 850 units/hr 10 hours after sheath removal (sheath documented removed on 12/22 at 1031). Will plan for heparin level 8 hours after heparin restart. Okay per cardiology to restart warfarin tonight. Will order warfarin 6 mg tonight.   Antonietta Jewel, PharmD, [place] Clinical Pharmacist

## 2019-03-26 NOTE — Progress Notes (Signed)
Patient cath site bleeding cath Lab notified, also patient has blood in his urine, MD notified, cath lab came to change the TR band no bleeding at this time. Will continue to monitor.

## 2019-03-26 NOTE — Progress Notes (Signed)
Per previous RN patient scheduled for cath today if INR appropriate and family awaiting confirmation of procedure. Attempted to verify with cath lab if patient definitely going to cath this morning in order to inform patient wife.  Unable to get in contact with anyone in cath lab.  Will attempt again.

## 2019-03-26 NOTE — Progress Notes (Signed)
Progress Note  Patient Name: Gregory Werner Date of Encounter: 03/26/2019  Primary Cardiologist:   Minus Breeding, MD   Subjective   He denies chest pain.  No SOB.    Inpatient Medications    Scheduled Meds: . [START ON 03/27/2019] aspirin  81 mg Oral Daily  . atorvastatin  40 mg Oral q1800  . carvedilol  3.125 mg Oral BID WC  . furosemide  20 mg Intravenous BID  . sodium chloride flush  3 mL Intravenous Q12H  . sodium chloride flush  3 mL Intravenous Q12H  . sodium chloride flush  3 mL Intravenous Q12H   Continuous Infusions: . sodium chloride    . sodium chloride    . sodium chloride    . heparin     PRN Meds: sodium chloride, sodium chloride, acetaminophen, docusate sodium, hydrALAZINE, labetalol, ondansetron (ZOFRAN) IV, senna, sodium chloride flush, sodium chloride flush   Vital Signs    Vitals:   03/26/19 1014 03/26/19 1019 03/26/19 1024 03/26/19 1029  BP: 130/86 127/65 122/77 130/67  Pulse: (!) 48 (!) 52 62 (!) 43  Resp: 17 (!) 23 (!) 26 18  Temp:      TempSrc:      SpO2: 96% 98% 96% 97%  Weight:      Height:        Intake/Output Summary (Last 24 hours) at 03/26/2019 1231 Last data filed at 03/26/2019 0900 Gross per 24 hour  Intake 647.02 ml  Output 575 ml  Net 72.02 ml   Filed Weights   03/24/19 0406 03/25/19 0423 03/26/19 0417  Weight: 63.2 kg 62 kg 60.5 kg    Telemetry    NSR - Personally Reviewed  ECG    NA - Personally Reviewed  Physical Exam   GEN: No acute distress.  Frail appearing Neck: No  JVD Cardiac: RRR, no murmurs, rubs, or gallops.  Respiratory: Clear  to auscultation bilaterally. GI: Soft, nontender, non-distended  MS: No edema; No deformity.  Right radial intact without bleeding or brusing Neuro:  Nonfocal  Psych: Normal affect   Labs    Chemistry Recent Labs  Lab 03/22/19 0410 03/23/19 0330 03/24/19 0425 03/25/19 0509 03/26/19 0433  NA 138 139 142 146* 148*  K 3.5 3.6 3.3* 3.4* 3.3*  CL 99 99 101 104  104  CO2 27 27 29 31 30   GLUCOSE 101* 128* 129* 122* 128*  BUN 35* 33* 37* 41* 45*  CREATININE 1.08 1.30* 1.08 1.22 1.32*  CALCIUM 8.5* 8.8* 8.9 8.9 9.2  PROT 5.2* 6.0* 5.6*  --   --   ALBUMIN 2.7* 2.9* 2.7*  --   --   AST 57* 42* 29  --   --   ALT 75* 65* 49*  --   --   ALKPHOS 111 112 101  --   --   BILITOT 1.2 1.2 1.3*  --   --   GFRNONAA 59* 47* 59* 51* 47*  GFRAA >60 55* >60 59* 54*  ANIONGAP 12 13 12 11 14      Hematology Recent Labs  Lab 03/24/19 0425 03/25/19 0509 03/26/19 0433  WBC 13.4* 15.2* 16.9*  RBC 4.50 4.67 5.08  HGB 14.1 14.6 16.0  HCT 43.3 45.7 49.4  MCV 96.2 97.9 97.2  MCH 31.3 31.3 31.5  MCHC 32.6 31.9 32.4  RDW 13.4 13.5 13.5  PLT 298 290 309    Cardiac EnzymesNo results for input(s): TROPONINI in the last 168 hours. No results for input(s): TROPIPOC  in the last 168 hours.   BNP Recent Labs  Lab 03/20/19 2226  BNP 3,183.6*     DDimer No results for input(s): DDIMER in the last 168 hours.   Radiology    CARDIAC CATHETERIZATION  Result Date: 03/26/2019  Mid RCA lesion is 85% stenosed.  Prox RCA lesion is 45% stenosed.  Prox RCA to Mid RCA lesion is 40% stenosed.  1st Mrg lesion is 100% stenosed.  Dist RCA lesion is 30% stenosed.  LV end diastolic pressure is low.  Lat 1st Diag-1 lesion is 99% stenosed.  Lat 1st Diag-2 lesion is 100% stenosed.  Significant coronary calcification and multivessel CAD with subtotal large first diagonal stenosis/occlusion with faint retrograde collateralization from the distal LAD and distal RCA; totally occluded circumflex marginal vessel; very large dominant calcified RCA with 40 to 50% smooth proximal stenosis, 40% proximal to mid stenosis, and evidence for 85% stenosis with severe eccentricity secondary to significant calcification proximal to the acute margin followed by 30% distal stenosis before large PDA and PLA vessel. Very mild increase in pulmonary artery systolic pressure. Previous echo documentation  of EF 30 to 35%. RECOMMENDATION: We will gently hydrate post cath procedure with his creatinine clearance around 30.  Consider initiation of low-dose carvedilol.  In light of age of 47 years consider initial medical therapy trial but and if increasing symptomatology intervention to the RCA would be difficult due to vessel tortuosity and significant calcification and would require atherectomy.    Cardiac Studies   Diagnostic Dominance: Right  Intervention    Patient Profile     85 y.o. male with a hx of permanent Afib on coumadin, HTN, HLD, prostate cancerseen for new onset CHF.   Assessment & Plan    Acute combined CHF Started on low dose Coreg today.   Will hold Lasix today and resume PO in the AM. . LVEDP down to 4.  RA 6.    Hypokalemic Aggressively supplemented today.     HTN This is being managed in the context of treating his CHF  Persistent afib Restart warfarin per pharmacy.    CAD Reviewed with the patient.  Medical management.  OK to continue ASA for now.  Will consider this long term with warfarin.    DISPOSITION:  Discussed with the wife and she is worried about his frailty.  He might need PT at home or even rehab.  I will consult PT today.  For questions or updates, please contact [facility] Please consult [email or web address] for contact info under Cardiology/STEMI.   Signed, Minus Breeding, MD  03/26/2019, 12:31 PM

## 2019-03-26 NOTE — Progress Notes (Signed)
Patient's wife notified of continued plan for cath.  Patient wife at bedside prior to procedure.

## 2019-03-26 NOTE — Progress Notes (Signed)
PT Cancellation Note  Patient Details Name: Gregory Werner MRN: [id number] DOB: [birth year]   Cancelled Treatment:    Reason Eval/Treat Not Completed: Other (comment) RN requests PT held at this time due to difficulty with getting TR band to stop bleeding. PT will continue to follow acutely.    Earney Navy, PTA Acute Rehabilitation Services Pager: [phone] Office: [phone]   03/26/2019, 3:42 PM

## 2019-03-26 NOTE — Progress Notes (Signed)
Patient waiting in the Holding Area. His case was delayed due to a STEMI patient for Dr Claiborne Billings.

## 2019-03-26 NOTE — Progress Notes (Signed)
Pt wife called for update about procedure.  Still unable to get in touch with anyone in cath lab.  Paged cardiology MD who is unaware of say teams plans but stated that they would be here at 0600.  Will attempt then and update wife.

## 2019-03-27 DIAGNOSIS — R7989 Other specified abnormal findings of blood chemistry: Secondary | ICD-10-CM

## 2019-03-27 DIAGNOSIS — I447 Left bundle-branch block, unspecified: Secondary | ICD-10-CM

## 2019-03-27 LAB — BASIC METABOLIC PANEL
Anion gap: 12 (ref 5–15)
BUN: 53 mg/dL — ABNORMAL HIGH (ref 8–23)
CO2: 26 mmol/L (ref 22–32)
Calcium: 8.9 mg/dL (ref 8.9–10.3)
Chloride: 114 mmol/L — ABNORMAL HIGH (ref 98–111)
Creatinine, Ser: 1.3 mg/dL — ABNORMAL HIGH (ref 0.61–1.24)
GFR calc Af Amer: 55 mL/min — ABNORMAL LOW (ref >60)
GFR calc non Af Amer: 47 mL/min — ABNORMAL LOW (ref >60)
Glucose, Bld: 133 mg/dL — ABNORMAL HIGH (ref 70–99)
Potassium: 4.1 mmol/L (ref 3.5–5.1)
Sodium: 152 mmol/L — ABNORMAL HIGH (ref 135–145)

## 2019-03-27 LAB — CBC
HCT: 47.6 % (ref 39.0–52.0)
Hemoglobin: 15.3 g/dL (ref 13.0–17.0)
MCH: 31.5 pg (ref 26.0–34.0)
MCHC: 32.1 g/dL (ref 30.0–36.0)
MCV: 98.1 fL (ref 80.0–100.0)
Platelets: 269 10*3/uL (ref 150–400)
RBC: 4.85 MIL/uL (ref 4.22–5.81)
RDW: 13.6 % (ref 11.5–15.5)
WBC: 19.8 10*3/uL — ABNORMAL HIGH (ref 4.0–10.5)
nRBC: 0 % (ref 0.0–0.2)

## 2019-03-27 LAB — PROTIME-INR
INR: 1.5 — ABNORMAL HIGH (ref 0.8–1.2)
Prothrombin Time: 17.6 seconds — ABNORMAL HIGH (ref 11.4–15.2)

## 2019-03-27 MED ORDER — ASPIRIN 81 MG PO CHEW: 81.0000 mg | CHEWABLE_TABLET | Freq: Every day | ORAL | 0 refills | Status: AC

## 2019-03-27 MED ORDER — ATORVASTATIN CALCIUM 40 MG PO TABS: 40.0000 mg | ORAL_TABLET | Freq: Every day | ORAL | 0 refills | Status: AC

## 2019-03-27 MED ORDER — CARVEDILOL 3.125 MG PO TABS: 3.1250 mg | ORAL_TABLET | Freq: Two times a day (BID) | ORAL | 0 refills | Status: AC

## 2019-03-27 MED ORDER — SULFAMETHOXAZOLE-TRIMETHOPRIM 400-80 MG PO TABS: 1.0000 | ORAL_TABLET | Freq: Two times a day (BID) | ORAL | 0 refills | Status: DC

## 2019-03-27 MED ORDER — CEPHALEXIN 500 MG PO CAPS: 500.0000 mg | ORAL_CAPSULE | Freq: Two times a day (BID) | ORAL | 0 refills | Status: AC

## 2019-03-27 MED ORDER — WARFARIN SODIUM 5 MG PO TABS
5.0000 mg | ORAL_TABLET | ORAL | Status: AC
  Administered 2019-03-27: 5 mg via ORAL
  Filled 2019-03-27: qty 1

## 2019-03-27 NOTE — Evaluation (Signed)
Clinical/Bedside Swallow Evaluation Patient Details  Name: Gregory Werner MRN: [id number] Date of Birth: 07/21/1924  Today's Date: 03/27/2019 Time: SLP Start Time (ACUTE ONLY): 1443 SLP Stop Time (ACUTE ONLY): 1500 SLP Time Calculation (min) (ACUTE ONLY): 17 min  Past Medical History:  Past Medical History:  Diagnosis Date  . Arthritis   . Atrial fibrillation ([place])   . Diverticulosis    history  . Dysphagia 2001   history s/p esophageal dilatation  . Dysrhythmia    hx. Atrial Fibrillation, RBBB  . GERD (gastroesophageal reflux disease)   . Hearing loss    bilateral hearing aids  . Hyperlipidemia   . Hypertension    benign  . Lung infiltrate 05/2010   left lower lobe infiltrate, questionable pneumonia  . Prostate cancer [facility]) 2001   treated with radiation   Past Surgical History:  Past Surgical History:  Procedure Laterality Date  . CATARACT EXTRACTION  2008  . ESOPHAGEAL DILATION  2001  . EYE SURGERY    . JOINT REPLACEMENT     LTKA  . PROSTATE SURGERY    . REPLACEMENT TOTAL KNEE  2012   left  . RIGHT/LEFT HEART CATH AND CORONARY ANGIOGRAPHY N/A 03/26/2019   Procedure: RIGHT/LEFT HEART CATH AND CORONARY ANGIOGRAPHY;  Surgeon: Troy Sine, MD;  Location: [facility];  Service: Cardiovascular;  Laterality: N/A;  . TONSILLECTOMY    . TOTAL KNEE ARTHROPLASTY Right 08/19/2013   Procedure: RIGHT TOTAL KNEE ARTHROPLASTY;  Surgeon: Gearlean Alf, MD;  Location: [facility];  Service: Orthopedics;  Laterality: Right;  Marland Kitchen VASECTOMY     HPI:  Patient is 85 yo male, arrived to ED 03/20/2019 after having an abnormal EKG in PCP's office.  Pt reported SOB and cough for a few days.  He was admitted for new onset CHF. Cardiology consulted. PMHx:  prostate CA treated with radiation, atherosclerosis, CHF, HTN, L and R BBB, a-fib, hyperlipidemia.   Assessment / Plan / Recommendation Clinical Impression   Patient's wife present. Patient seen at bedside, sitting upright with HOB  raised. Pt with slight head lean to R side, wife reports this is his baseline position. Pt with significant fatigue/lethargy, impacting his participation in this BSE. Pt able to follow simple verbal commands. When presented with ice chip bolus via spoon presentation, patient accepting bolus initially, but then expelling (due to coldness). When presented with thin liquid (water) via straw, patient grasping straw labially, but required verbal cueing to extract liquid. swallow triggered after suspected AP transfer delay. Pt with immediate (weak) cough following 2/3 straw sips. Patient provided cup and HOH assist to lift cup to mouth. Pt able to accept small thin liquid bolus (water), no anterior labial spillage observed. Patient taking two very small sips of water, suspect AP transfer delay, able to initiate swallow when cued verbally. Patient with significant fatigue, eyes closed posture, reduced attention. BSE discontinued d/t patient fatigue.   Patient's wife reports some reduced appetite, but no overt difficulty with liquids or solids at home. Wife denies observation of patient choking/coughing during meals, though she does note that he has "some difficulty swallowing pills." Patient will be discharged home tomorrow with 24 hr supervision. Recommend HH SLP to ensure swallowing needs are addressed.   SLP Visit Diagnosis: Dysphagia, unspecified (R13.10)    Aspiration Risk  Risk for inadequate nutrition/hydration    Diet Recommendation  Recommend to continue regular/thin diet as tolerated.   Medication Administration: Crushed with puree Supervision: Staff to assist with self feeding  Compensations: Minimize environmental distractions Postural Changes: Seated upright at 90 degrees;Remain upright for at least 30 minutes after po intake    Other  Recommendations Oral Care Recommendations: Oral care QID;Oral care before and after PO   Follow up Recommendations Home health SLP      Frequency and Duration             Prognosis        Swallow Study   General HPI: Patient is 85 yo male, arrived to ED 03/20/2019 after having an abnormal EKG in PCP's office.  Pt reported SOB and cough for a few days.  He was admitted for new onset CHF. Cardiology consulted. PMHx:  prostate CA treated with radiation, atherosclerosis, CHF, HTN, L and R BBB, a-fib, hyperlipidemia. Type of Study: Bedside Swallow Evaluation Previous Swallow Assessment: None known Diet Prior to this Study: Regular;Thin liquids Temperature Spikes Noted: No Respiratory Status: Room air History of Recent Intubation: No Behavior/Cognition: Lethargic/Drowsy Oral Cavity Assessment: Within Functional Limits Oral Cavity - Dentition: Adequate natural dentition Vision: Functional for self-feeding Self-Feeding Abilities: Needs assist Patient Positioning: Upright in bed Baseline Vocal Quality: Low vocal intensity Volitional Cough: Other (Comment)(DNT) Volitional Swallow: Able to elicit    Oral/Motor/Sensory Function Overall Oral Motor/Sensory Function: Within functional limits Facial ROM: Within Functional Limits Facial Symmetry: Within Functional Limits Facial Strength: Within Functional Limits Facial Sensation: Within Functional Limits Lingual ROM: Other (Comment);Within Functional Limits Lingual Symmetry: Within Functional Limits Lingual Strength: Within Functional Limits Lingual Sensation: Within Functional Limits Velum: Within Functional Limits Mandible: Within Functional Limits   Ice Chips Ice chips: (Pt provided an ice chip, he expelled.) Presentation: Spoon Other Comments: Pt with significant lethargy   Thin Liquid Thin Liquid: Impaired Presentation: Cup;Straw;Spoon Oral Phase Functional Implications: Prolonged oral transit Pharyngeal  Phase Impairments: Suspected delayed Swallow(delay may be d/t fatigue)    Nectar Thick Nectar Thick Liquid: Not tested   Honey Thick Honey Thick Liquid: Not tested   Puree Puree: Not  tested   Solid            Marina Goodell, M.Ed., CCC-SLP Acute Rehabilitation Speech Therapy 03/27/2019,3:35 PM

## 2019-03-27 NOTE — Progress Notes (Addendum)
MEWS Guidelines - (patients age 83 and over)  Notified by NT patient of MEWS score of 5 due to BP and RR, RN to bedside at 1056. BP reassessed by NT. Patient drowsy, but alert and arousable. Able to state he was ok when asked and upon inquiry confirmed he was tired. RN reassessed BP manually and assessed patient. Wife at bedside. MD notified.   Paged MD Avon Gully at 1113  Pt. BP 60/48, HR 58,RR 26 @ 1054.BP 90/58 HR 62 right arm RR24 @1056 .Drowsy, but arousable.wife at bedside  Paged Avon Gully at 1121. Please callback about prior page. Upon callback at 37 informed of wife concerns about assistance with patient care at home, stated maybe needs 24 hour care, however, wife stated wants patient at home. Informed about MEWS, patient arousable, but drowsy, no obvious distress and went to bathroom with NT prior to BP assessment. MD provided no actions/orders  for MEWS score. RN will continue to montior and MD will follow up about resource/support needed upon discharge  Red - At High Risk for Deterioration Yellow - At risk for Deterioration  1. Go to room and assess patient 2. Validate data. Is this patient's baseline? If data confirmed: 3. Is this an acute change? 4. Administer prn meds/treatments as ordered. 5. Note Sepsis score 6. Review goals of care 7. Sports coach, RRT nurse and Provider. 8. Ask Provider to come to bedside.  9. Document patient condition/interventions/response. 10. Increase frequency of vital signs and focused assessments to at least q15 minutes x 4, then q30 minutes x2. - If stable, then q1h x3, then q4h x3 and then q8h or dept. routine. - If unstable, contact Provider & RRT nurse. Prepare for possible transfer. 11. Add entry in progress notes using the smart phrase ".MEWS". 1. Go to room and assess patient 2. Validate data. Is this patient's baseline? If data confirmed: 3. Is this an acute change? 4. Administer prn meds/treatments as ordered? 5. Note Sepsis  score 6. Review goals of care 7. Sports coach and Provider 8. Call RRT nurse as needed. 9. Document patient condition/interventions/response. 10. Increase frequency of vital signs and focused assessments to at least q2h x2. - If stable, then q4h x2 and then q8h or dept. routine. - If unstable, contact Provider & RRT nurse. Prepare for possible transfer. 11. Add entry in progress notes using the smart phrase ".MEWS".  Green - Likely stable Lavender - Comfort Care Only  1. Continue routine/ordered monitoring.  2. Review goals of care. 1. Continue routine/ordered monitoring. 2. Review goals of care.

## 2019-03-27 NOTE — Discharge Summary (Addendum)
Physician Discharge Summary  [name] [id number] DOB: 1924-04-30 DOA: 03/20/2019  PCP: [name], DO  Admit date: 03/20/2019 Discharge date: 03/27/2019  Admitted From: Home Disposition: Home with home health  Recommendations for Outpatient Follow-up:  1. Follow up with PCP in 1-2 weeks 2. Please obtain BMP/CBC in one week  Home Health: Yes Equipment/Devices: Bed  Discharge Condition: Stable CODE STATUS: DNR Diet recommendation: Cardiac prudent low-salt diet  Brief/Interim Summary: Gregory Werner is a 85 y.o.malewith medical history significant ofA. fib, hypertension, hyperlipidemia, prostate cancer treated with radiation being sent to the ED by his PCP for evaluation ofanabnormal EKG. Patient states he saw his PCP yesterday and was advised to come into the ED as his EKG appeared abnormal. He has been experiencing shortness of breath, orthopnea, and a cough for the past few days. Denies chest pain. Work up in the ED was significant for BNP 3183, Chest x-ray showing cardiomegaly with vascular congestion and possible mild interstitial edema. Patient received IV Lasix 20 mg.  He was admitted for new onset CHF as well as new left bundle branch block on EKG.  Cardiology was consulted.   Patient admitted as above with notably abnormal EKG at PCP office with symptoms of shortness of breath orthopnea and cough found to have newly diagnosed heart failure.  Cardiology evaluating here, patient diuresed appropriately on diuretics, continue carvedilol, Lasix, currently holding off on ARB/ARNI given borderline elevated creatinine, certainly reasonable to start in the outpatient setting in the next 1 to 2 weeks pending labs. Will resume patient's warfarin at previous dosing, will need to follow-up with cardiology for ongoing Coumadin dosing -discussed possible use of home INR machine to help alleviate patient's difficulty with leaving the house.  PT OT evaluated recommending to continue  home health and reinitiate physical therapy at home.  Patient otherwise stable and agreeable for discharge home.  Incidentally noted repeat UA overnight does show what appears to be asymptomatic bacteriuria.  Will discharge patient with prescription for cephalexin however we discussed that unless the patient develops symptoms would hold off on taking the medication as he remains symptom-free from my discussion today.  *No acute issues or events overnight, patient and family looking forward to disposition later today  **Further discussion with family, Case management - family now hoping for hospice at home - have spoken with [facility] and will have them follow along at home - for "hospice at home".  Discharge Diagnoses:  Principal Problem:   CHF (congestive heart failure) (HCC) Active Problems:   HYPERTENSION, BENIGN   Atrial fibrillation (HCC)   LBBB (left bundle branch block)   Elevated LFTs   Acute systolic HF (heart failure) (HCC)   Shortness of breath    Discharge Instructions  Discharge Instructions    Call MD for:  difficulty breathing, headache or visual disturbances   Complete by: As directed    Call MD for:  extreme fatigue   Complete by: As directed    Call MD for:  hives   Complete by: As directed    Call MD for:  persistant dizziness or light-headedness   Complete by: As directed    Call MD for:  persistant nausea and vomiting   Complete by: As directed    Call MD for:  severe uncontrolled pain   Complete by: As directed    Call MD for:  temperature >100.4   Complete by: As directed    Diet - low sodium heart healthy   Complete by: As directed  Increase activity slowly   Complete by: As directed      Allergies as of 03/27/2019      Reactions   Cephalexin Diarrhea      Medication List    STOP taking these medications   ramipril 5 MG capsule Commonly known as: ALTACE     TAKE these medications   acetaminophen 500 MG tablet Commonly known as:  TYLENOL Take 1,000 mg by mouth every 6 (six) hours as needed for mild pain (Pain).   aspirin 81 MG chewable tablet Chew 1 tablet (81 mg total) by mouth daily. Start taking on: March 28, 2019   atorvastatin 40 MG tablet Commonly known as: LIPITOR Take 1 tablet (40 mg total) by mouth daily at 6 PM.   BILBERRY EXTRACT PO Take by mouth daily.   calcium carbonate 200 MG capsule Take 800 mg by mouth daily.   carvedilol 3.125 MG tablet Commonly known as: COREG Take 1 tablet (3.125 mg total) by mouth 2 (two) times daily with a meal.   cephALEXin 500 MG capsule Commonly known as: KEFLEX Take 1 capsule (500 mg total) by mouth 2 (two) times daily for 3 days.   cholecalciferol 1000 units tablet Commonly known as: VITAMIN D Take 1,000 Units by mouth daily.   famotidine 10 MG chewable tablet Commonly known as: PEPCID AC Chew 10 mg by mouth at bedtime.   Fish Oil 1200 MG Caps Take 1 capsule by mouth daily.   glucosamine-chondroitin 500-400 MG tablet Take 1 tablet by mouth 3 (three) times daily.   Myrbetriq 50 MG Tb24 tablet Generic drug: mirabegron ER Take 1 tablet by mouth daily. Takes at PM   PreserVision AREDS Tabs Take 2 tablets by mouth daily.   terazosin 2 MG capsule Commonly known as: HYTRIN Take 2 mg by mouth at bedtime.   VITAMIN B 12 PO Take 1,000 mg by mouth daily.   vitamin C 500 MG tablet Commonly known as: ASCORBIC ACID Take 500 mg by mouth daily.   warfarin 5 MG tablet Commonly known as: COUMADIN Take as directed. If you are unsure how to take this medication, talk to your nurse or doctor. Original instructions: TAKE 1/2 TO 1 TABLET BY MOUTH DAILY AS DIRECTED BY COUMADIN CLINIC What changed:   how much to take  how to take this  when to take this  additional instructions            Durable Medical Equipment  (From admission, onward)         Start     Ordered   03/27/19 1243  For home use only DME Hospital bed  Once    Question  Answer Comment  Length of Need Lifetime   Patient has (list medical condition): CHF, HTN, afib, SOB   The above medical condition requires: Patient requires the ability to reposition frequently   Head must be elevated greater than: 30 degrees   Bed type Semi-electric   Hoyer Lift Yes   Support Surface: Gel Overlay      03/27/19 1246   03/25/19 1524  For home use only DME 3 n 1  Once     03/25/19 1523         Follow-up Information    [facility] Follow up.   Why: 3 n 1, hospital bed       [facility], [facility] Follow up.   Why: HHPT, HHOT, [place], [place], Environmental education officer information: [place] [address] [phone]  Allergies  Allergen Reactions  . Cephalexin Diarrhea    Consultations:  Cardiology, [facility], Dr Percival Spanish following   Procedures/Studies: DG Chest 2 View  Result Date: 03/20/2019 CLINICAL DATA:  Weakness over the last 2 days. EXAM: CHEST - 2 VIEW COMPARISON:  08/09/2013 FINDINGS: Cardiomegaly. Aortic atherosclerosis. Abnormal interstitial lung markings, most consistent with fluid overload/congestive heart failure. Cannot rule out the possibility of viral pneumonia. No dense consolidation or lobar collapse. Minimal blunting of the costophrenic angles but no large effusion. IMPRESSION: Cardiomegaly. Probable mild pulmonary edema/congestive heart failure. Cannot rule out the possibility of viral pneumonia. Electronically Signed   By: Nelson Chimes M.D.   On: 03/20/2019 13:54   CARDIAC CATHETERIZATION  Result Date: 03/26/2019  Mid RCA lesion is 85% stenosed.  Prox RCA lesion is 45% stenosed.  Prox RCA to Mid RCA lesion is 40% stenosed.  1st Mrg lesion is 100% stenosed.  Dist RCA lesion is 30% stenosed.  LV end diastolic pressure is low.  Lat 1st Diag-1 lesion is 99% stenosed.  Lat 1st Diag-2 lesion is 100% stenosed.  Significant coronary calcification and multivessel CAD with subtotal large first diagonal stenosis/occlusion with faint  retrograde collateralization from the distal LAD and distal RCA; totally occluded circumflex marginal vessel; very large dominant calcified RCA with 40 to 50% smooth proximal stenosis, 40% proximal to mid stenosis, and evidence for 85% stenosis with severe eccentricity secondary to significant calcification proximal to the acute margin followed by 30% distal stenosis before large PDA and PLA vessel. Very mild increase in pulmonary artery systolic pressure. Previous echo documentation of EF 30 to 35%. RECOMMENDATION: We will gently hydrate post cath procedure with his creatinine clearance around 30.  Consider initiation of low-dose carvedilol.  In light of age of 58 years consider initial medical therapy trial but and if increasing symptomatology intervention to the RCA would be difficult due to vessel tortuosity and significant calcification and would require atherectomy.   DG Chest Port 1 View  Result Date: 03/20/2019 CLINICAL DATA:  Shortness of breath EXAM: PORTABLE CHEST 1 VIEW COMPARISON:  03/20/2019 FINDINGS: Cardiomegaly, vascular congestion. Mild interstitial prominence could reflect interstitial edema. No effusions or acute bony abnormality. IMPRESSION: Cardiomegaly with vascular congestion and possible mild interstitial edema. Findings similar to prior study. Electronically Signed   By: Rolm Baptise M.D.   On: 03/20/2019 21:32   ECHOCARDIOGRAM COMPLETE  Result Date: 03/21/2019   ECHOCARDIOGRAM REPORT   Patient Name:   KUE FAMBROUGH Ramanathan Date of Exam: 03/21/2019 Medical Rec #:  [id number]     Height:       70.0 in Accession #:    [id number]    Weight:       160.5 lb Date of Birth:  [birth year]    BSA:          1.90 m Patient Age:    85 years      BP:           145/62 mmHg Patient Gender: M             HR:           61 bpm. Exam Location:  Inpatient Procedure: 2D Echo Indications:    CHF-Acute Diastolic [id number] / [id number]  History:        Patient has prior history of Echocardiogram examinations, most                  recent 03/13/2015. Arrythmias:Atrial Fibrillation; Risk  Factors:Hypertension and Dyslipidemia. History of prostate                 cancer, GERD.  Sonographer:    Darlina Sicilian RDCS Referring Phys: [id number] [place]  1. Left ventricular ejection fraction, by visual estimation, is 30 to 35%. The left ventricle has severely decreased function. Left ventricular septal wall thickness was severely increased. Mildly increased left ventricular posterior wall thickness. There is mildly increased left ventricular hypertrophy.  2. Abnormal septal motion consistent with left bundle branch block.  3. Left ventricular diastolic function could not be evaluated.  4. The left ventricle demonstrates global hypokinesis.  5. Global right ventricle has moderately reduced systolic function.The right ventricular size is normal. No increase in right ventricular wall thickness.  6. Left atrial size was severely dilated.  7. Right atrial size was not assessed.  8. The mitral valve is abnormal. Trivial mitral valve regurgitation.  9. The tricuspid valve is grossly normal. Tricuspid valve regurgitation is mild. 10. The aortic valve is tricuspid. Aortic valve regurgitation is mild. Mild aortic valve sclerosis without stenosis. 11. The pulmonic valve was grossly normal. Pulmonic valve regurgitation is trivial. 12. Aortic dilatation noted. 13. There is mild dilatation of the ascending aorta and of the aortic root measuring 40 mm. 14. Moderately elevated pulmonary artery systolic pressure. 15. The tricuspid regurgitant velocity is 3.40 m/s, and with an assumed right atrial pressure of 15 mmHg, the estimated right ventricular systolic pressure is moderately elevated at 61.2 mmHg. 16. The inferior vena cava is dilated in size with <50% respiratory variability, suggesting right atrial pressure of 15 mmHg. FINDINGS  Left Ventricle: Left ventricular ejection fraction, by visual estimation, is 30 to 35%. The  left ventricle has severely decreased function. The left ventricle demonstrates global hypokinesis. Mildly increased left ventricular posterior wall thickness. There is mildly increased left ventricular hypertrophy. Abnormal (paradoxical) septal motion, consistent with left bundle branch block. The left ventricular diastology could not be evaluated due to atrial fibrillation. Left ventricular diastolic function  could not be evaluated. Right Ventricle: The right ventricular size is normal. No increase in right ventricular wall thickness. Global RV systolic function is has moderately reduced systolic function. The tricuspid regurgitant velocity is 3.40 m/s, and with an assumed right atrial pressure of 15 mmHg, the estimated right ventricular systolic pressure is moderately elevated at 61.2 mmHg. Left Atrium: Left atrial size was severely dilated. Right Atrium: Right atrial size was not assessed Pericardium: There is no evidence of pericardial effusion. Mitral Valve: The mitral valve is abnormal. There is mild thickening of the mitral valve leaflet(s). Trivial mitral valve regurgitation. Tricuspid Valve: The tricuspid valve is grossly normal. Tricuspid valve regurgitation is mild. Aortic Valve: The aortic valve is tricuspid. Aortic valve regurgitation is mild. Mild aortic valve sclerosis is present, with no evidence of aortic valve stenosis. Pulmonic Valve: The pulmonic valve was grossly normal. Pulmonic valve regurgitation is trivial. Pulmonic regurgitation is trivial. Aorta: Aortic dilatation noted. There is mild dilatation of the ascending aorta and of the aortic root measuring 40 mm. Venous: The inferior vena cava is dilated in size with less than 50% respiratory variability, suggesting right atrial pressure of 15 mmHg. IAS/Shunts: No atrial level shunt detected by color flow Doppler.  LEFT VENTRICLE PLAX 2D LVIDd:         4.30 cm LVIDs:         3.80 cm LV PW:         1.20 cm LV IVS:  2.00 cm LVOT diam:      1.70 cm LV SV:         21 ml LV SV Index:   11.12 LVOT Area:     2.27 cm  LV Volumes (MOD) LV area d, A2C:    25.70 cm LV area d, A4C:    34.90 cm LV area s, A2C:    23.80 cm LV area s, A4C:    27.50 cm LV major d, A2C:   7.37 cm LV major d, A4C:   8.09 cm LV major s, A2C:   7.18 cm LV major s, A4C:   7.86 cm LV vol d, MOD A2C: 77.0 ml LV vol d, MOD A4C: 124.0 ml LV vol s, MOD A2C: 66.4 ml LV vol s, MOD A4C: 80.8 ml LV SV MOD A2C:     10.6 ml LV SV MOD A4C:     124.0 ml LV SV MOD BP:      25.3 ml RIGHT VENTRICLE RV S prime:     6.81 cm/s TAPSE (M-mode): 1.6 cm LEFT ATRIUM              Index LA diam:        4.20 cm  2.21 cm/m LA Vol (A2C):   104.6 ml 55.00 ml/m LA Vol (A4C):   121.0 ml 63.65 ml/m LA Biplane Vol: 112.0 ml 58.92 ml/m  AORTIC VALVE LVOT Vmax:   113.00 cm/s LVOT Vmean:  66.400 cm/s LVOT VTI:    0.154 m  AORTA Ao Root diam: 4.20 cm Ao Asc diam:  3.95 cm MITRAL VALVE                       TRICUSPID VALVE MV Area (PHT): 6.71 cm            TR Peak grad:   46.2 mmHg MV PHT:        32.77 msec          TR Vmax:        359.00 cm/s MV Decel Time: 113 msec MV E velocity: 73.25 cm/s 103 cm/s SHUNTS                                    Systemic VTI:  0.15 m                                    Systemic Diam: 1.70 cm  Lyman Bishop MD Electronically signed by Lyman Bishop MD Signature Date/Time: 03/21/2019/10:02:12 AM    Final    US Abdomen Limited RUQ  Result Date: 03/21/2019 CLINICAL DATA:  Elevated liver function tests EXAM: ULTRASOUND ABDOMEN LIMITED RIGHT UPPER QUADRANT COMPARISON:  None. FINDINGS: Gallbladder: No gallstones or wall thickening visualized. No sonographic Murphy sign noted by sonographer. Common bile duct: Diameter: 4 mm Liver: 16 mm left hepatic cyst. 24 mm right hepatic cyst. No evidence of solid mass. Within normal limits in parenchymal echogenicity. Portal vein is patent on color Doppler imaging with normal direction of blood flow towards the liver. IMPRESSION: 1. No explanation  for the history.  No acute finding. 2. Small, incidental hepatic cysts. Electronically Signed   By: Monte Fantasia M.D.   On: 03/21/2019 04:15    Subjective: No acute issues or events overnight, somewhat weak but otherwise feels improved from admission.  Denies chest pain, shortness of breath, nausea, vomiting, diarrhea, constipation, headache, fevers, chills.   Discharge Exam: Vitals:   03/27/19 1129 03/27/19 1228  BP: (!) 117/92 95/73  Pulse: 68 71  Resp: (!) 24   Temp:    SpO2: 97% 98%   Vitals:   03/27/19 1056 03/27/19 1107 03/27/19 1129 03/27/19 1228  BP: (!) 90/58 95/60 (!) 117/92 95/73  Pulse: 62  68 71  Resp:  (!) 24 (!) 24   Temp:      TempSrc:      SpO2: 97%  97% 98%  Weight:      Height:        General:  Pleasantly resting in bed, No acute distress. HEENT:  Normocephalic atraumatic.  Sclerae nonicteric, noninjected.  Extraocular movements intact bilaterally. Neck:  Without mass or deformity.  Trachea is midline. Lungs:  Clear to auscultate bilaterally without rhonchi, wheeze, or rales. Heart:  Regular rate and rhythm.  Without murmurs, rubs, or gallops. Abdomen:  Soft, nontender, nondistended.  Without guarding or rebound. Extremities: Without cyanosis, clubbing, edema, or obvious deformity. Vascular:  Dorsalis pedis and posterior tibial pulses palpable bilaterally. Skin:  Warm and dry, no erythema, no ulcerations.   The results of significant diagnostics from this hospitalization (including imaging, microbiology, ancillary and laboratory) are listed below for reference.     Microbiology: Recent Results (from the past 240 hour(s))  SARS CORONAVIRUS 2 (TAT 6-24 HRS) Nasopharyngeal Nasopharyngeal Swab     Status: None   Collection Time: 03/21/19  4:34 AM   Specimen: Nasopharyngeal Swab  Result Value Ref Range Status   SARS Coronavirus 2 NEGATIVE NEGATIVE Final    Comment: (NOTE) SARS-CoV-2 target nucleic acids are NOT DETECTED. The SARS-CoV-2 RNA is  generally detectable in upper and lower respiratory specimens during the acute phase of infection. Negative results do not preclude SARS-CoV-2 infection, do not rule out co-infections with other pathogens, and should not be used as the sole basis for treatment or other patient management decisions. Negative results must be combined with clinical observations, patient history, and epidemiological information. The expected result is Negative. Fact Sheet for Patients: SugarRoll.be Fact Sheet for Healthcare Providers: https://www.woods-mathews.com/ This test is not yet approved or cleared by the [place] FDA and  has been authorized for detection and/or diagnosis of SARS-CoV-2 by FDA under an Emergency Use Authorization (EUA). This EUA will remain  in effect (meaning this test can be used) for the duration of the COVID-19 declaration under Section 56 4(b)(1) of the Act, 21 U.S.C. section 360bbb-3(b)(1), unless the authorization is terminated or revoked sooner. Performed at [facility], [place] [address] [place], [place] [address]   MRSA PCR Screening     Status: None   Collection Time: 03/24/19 10:58 PM   Specimen: Nasopharyngeal  Result Value Ref Range Status   MRSA by PCR NEGATIVE NEGATIVE Final    Comment:        The GeneXpert MRSA Assay (FDA approved for NASAL specimens only), is one component of a comprehensive MRSA colonization surveillance program. It is not intended to diagnose MRSA infection nor to guide or monitor treatment for MRSA infections. Performed at [facility], [place] [address], [place], [place] [address]      Labs: BNP (last 3 results) Recent Labs    03/20/19 2226  BNP [id number]*   Basic Metabolic Panel: Recent Labs  Lab 03/22/19 1118 03/23/19 0330 03/24/19 0425 03/25/19 0509 03/26/19 0433 03/26/19 1000 03/26/19 1001 03/26/19 1008 03/27/19  0439  NA  --  139 142 146* 148* 149* 149* 149*  152*  K  --  3.6 3.3* 3.4* 3.3* 3.8 4.0 3.8 4.1  CL  --  99 101 104 104  --   --   --  114*  CO2  --  27 29 31 30   --   --   --  26  GLUCOSE  --  128* 129* 122* 128*  --   --   --  133*  BUN  --  33* 37* 41* 45*  --   --   --  53*  CREATININE  --  1.30* 1.08 1.22 1.32*  --   --   --  1.30*  CALCIUM  --  8.8* 8.9 8.9 9.2  --   --   --  8.9  MG 1.9  --   --   --  2.2  --   --   --   --    Liver Function Tests: Recent Labs  Lab 03/20/19 2210 03/21/19 0351 03/22/19 0410 03/23/19 0330 03/24/19 0425  AST 88* 78* 57* 42* 29  ALT 104* 99* 75* 65* 49*  ALKPHOS 135* 127* 111 112 101  BILITOT 0.9 1.4* 1.2 1.2 1.3*  PROT 6.4* 5.8* 5.2* 6.0* 5.6*  ALBUMIN 3.4* 3.1* 2.7* 2.9* 2.7*   No results for input(s): LIPASE, AMYLASE in the last 168 hours. No results for input(s): AMMONIA in the last 168 hours. CBC: Recent Labs  Lab 03/20/19 2210 03/23/19 0330 03/24/19 0425 03/25/19 0509 03/26/19 0433 03/26/19 1000 03/26/19 1001 03/26/19 1008 03/27/19 0439  WBC 11.5* 11.1* 13.4* 15.2* 16.9*  --   --   --  19.8*  NEUTROABS 9.3*  --   --   --  14.7*  --   --   --   --   HGB 14.0 14.0 14.1 14.6 16.0 15.6 15.6 15.3 15.3  HCT 41.8 41.1 43.3 45.7 49.4 46.0 46.0 45.0 47.6  MCV 96.1 93.8 96.2 97.9 97.2  --   --   --  98.1  PLT 280 288 298 290 309  --   --   --  269   Cardiac Enzymes: No results for input(s): CKTOTAL, CKMB, CKMBINDEX, TROPONINI in the last 168 hours. BNP: Invalid input(s): POCBNP CBG: No results for input(s): GLUCAP in the last 168 hours. D-Dimer No results for input(s): DDIMER in the last 72 hours. Hgb A1c No results for input(s): HGBA1C in the last 72 hours. Lipid Profile No results for input(s): CHOL, HDL, LDLCALC, TRIG, CHOLHDL, LDLDIRECT in the last 72 hours. Thyroid function studies No results for input(s): TSH, T4TOTAL, T3FREE, THYROIDAB in the last 72 hours.  Invalid input(s): FREET3 Anemia work up No results for input(s): VITAMINB12, FOLATE, FERRITIN, TIBC,  IRON, RETICCTPCT in the last 72 hours. Urinalysis    Component Value Date/Time   COLORURINE AMBER (A) 03/26/2019 2237   APPEARANCEUR CLOUDY (A) 03/26/2019 2237   LABSPEC 1.028 03/26/2019 2237   PHURINE 9.0 (H) 03/26/2019 2237   GLUCOSEU NEGATIVE 03/26/2019 2237   HGBUR MODERATE (A) 03/26/2019 2237   BILIRUBINUR NEGATIVE 03/26/2019 2237   KETONESUR NEGATIVE 03/26/2019 2237   PROTEINUR >=300 (A) 03/26/2019 2237   UROBILINOGEN 2.0 (H) 08/09/2013 1417   NITRITE NEGATIVE 03/26/2019 2237   LEUKOCYTESUR LARGE (A) 03/26/2019 2237   Sepsis Labs Invalid input(s): PROCALCITONIN,  WBC,  LACTICIDVEN Microbiology Recent Results (from the past 240 hour(s))  SARS CORONAVIRUS 2 (TAT 6-24 HRS) Nasopharyngeal Nasopharyngeal Swab  Status: None   Collection Time: 03/21/19  4:34 AM   Specimen: Nasopharyngeal Swab  Result Value Ref Range Status   SARS Coronavirus 2 NEGATIVE NEGATIVE Final    Comment: (NOTE) SARS-CoV-2 target nucleic acids are NOT DETECTED. The SARS-CoV-2 RNA is generally detectable in upper and lower respiratory specimens during the acute phase of infection. Negative results do not preclude SARS-CoV-2 infection, do not rule out co-infections with other pathogens, and should not be used as the sole basis for treatment or other patient management decisions. Negative results must be combined with clinical observations, patient history, and epidemiological information. The expected result is Negative. Fact Sheet for Patients: SugarRoll.be Fact Sheet for Healthcare Providers: https://www.woods-mathews.com/ This test is not yet approved or cleared by the [place] FDA and  has been authorized for detection and/or diagnosis of SARS-CoV-2 by FDA under an Emergency Use Authorization (EUA). This EUA will remain  in effect (meaning this test can be used) for the duration of the COVID-19 declaration under Section 56 4(b)(1) of the Act, 21  U.S.C. section 360bbb-3(b)(1), unless the authorization is terminated or revoked sooner. Performed at [facility], [place] [address] [place], [place] [address]   MRSA PCR Screening     Status: None   Collection Time: 03/24/19 10:58 PM   Specimen: Nasopharyngeal  Result Value Ref Range Status   MRSA by PCR NEGATIVE NEGATIVE Final    Comment:        The GeneXpert MRSA Assay (FDA approved for NASAL specimens only), is one component of a comprehensive MRSA colonization surveillance program. It is not intended to diagnose MRSA infection nor to guide or monitor treatment for MRSA infections. Performed at [facility], [place] [address] [place], [place] [address]      Time coordinating discharge: Over 30 minutes  SIGNED:   Little Ishikawa, DO [facility] 03/27/2019, 1:23 PM Pager   If 7PM-7AM, please contact night-coverage [email or web address] Password [email or web address]

## 2019-03-27 NOTE — Progress Notes (Signed)
Physical Therapy Treatment Patient Details Name: Gregory Werner MRN: [id number] DOB: March 11, 1925 Today's Date: 03/27/2019    History of Present Illness 85 yo male who looks younger than his stated age was admitted with CHF causing SOB after having an abnormal EKG in PCP's office.  Noted cardiomegaly and vascular congestion, had elevated liver enzymes, and EF was noted at 30-35%.  Pt is Covid (-).   PMHx:  prostate CA, atherosclerosis, CHF, HTN, L and R BBB, a-fib,     PT Comments    PT significantly limited by fatigue during session, lethargic and having difficulty maintaining attention. Pt requiring max-total A for all mobility and unable to safely leave bed at this time. Pt also a limited participant in HEP, needing constant cueing to remain awake. PT and spouse discuss discharge plan and family would like to discharge home with 24/7 assistance. PT recommending HHPT, as well as hospital bed, manual wheelchair, mechanical lift, 3in1 commode.   Follow Up Recommendations  SNF;Home health PT;Supervision/Assistance - 24 hour(pt/family declining SNF, Home with 24/7)     Equipment Recommendations  Wheelchair (measurements PT);Wheelchair cushion (measurements PT);Hospital bed;Other (comment);3in1 (PT)(mechanical lift, narrow wheelchair per spouse)    Recommendations for Other Services       Precautions / Restrictions Precautions Precautions: Fall Precaution Comments: hematuria Restrictions Weight Bearing Restrictions: No    Mobility  Bed Mobility Overal bed mobility: Needs Assistance Bed Mobility: Supine to Sit;Sit to Supine     Supine to sit: Max assist;HOB elevated Sit to supine: Total assist;HOB elevated      Transfers Overall transfer level: Needs assistance Equipment used: 1 person hand held assist Transfers: Sit to/from Stand Sit to Stand: Total assist         General transfer comment: only able to come to 1/2 stand  Ambulation/Gait                  Stairs             Wheelchair Mobility    Modified Rankin (Stroke Patients Only)       Balance Overall balance assessment: Needs assistance Sitting-balance support: Bilateral upper extremity supported;Feet supported Sitting balance-Leahy Scale: Poor Sitting balance - Comments: max-minA with BUE support Postural control: Posterior lean   Standing balance-Leahy Scale: Zero                              Cognition Arousal/Alertness: Lethargic Behavior During Therapy: Flat affect Overall Cognitive Status: Impaired/Different from baseline Area of Impairment: Attention;Memory;Following commands;Safety/judgement                   Current Attention Level: Divided Memory: Decreased recall of precautions;Decreased short-term memory Following Commands: Follows one step commands inconsistently Safety/Judgement: Decreased awareness of safety;Decreased awareness of deficits            Exercises General Exercises - Lower Extremity Ankle Circles/Pumps: AROM;Both;10 reps Quad Sets: AROM;Both;10 reps Gluteal Sets: AROM;Both;10 reps Hip ABduction/ADduction: Both;10 reps;AAROM    General Comments        Pertinent Vitals/Pain Pain Assessment: Faces Faces Pain Scale: Hurts even more Pain Location: unable to determine Pain Descriptors / Indicators: Grimacing Pain Intervention(s): Limited activity within patient's tolerance    Home Living                      Prior Function            PT Goals (current goals can  now be found in the care plan section) Acute Rehab PT Goals Patient Stated Goal: to get home and continue with recovery Progress towards PT goals: Not progressing toward goals - comment    Frequency    Min 2X/week      PT Plan Frequency needs to be updated    Co-evaluation              AM-PAC PT "6 Clicks" Mobility   Outcome Measure  Help needed turning from your back to your side while in a flat bed without  using bedrails?: Total Help needed moving from lying on your back to sitting on the side of a flat bed without using bedrails?: Total Help needed moving to and from a bed to a chair (including a wheelchair)?: Total Help needed standing up from a chair using your arms (e.g., wheelchair or bedside chair)?: Total Help needed to walk in hospital room?: Total Help needed climbing 3-5 steps with a railing? : Total 6 Click Score: 6    End of Session Equipment Utilized During Treatment: (none) Activity Tolerance: Patient limited by fatigue Patient left: in bed;with call bell/phone within reach;with bed alarm set;with family/visitor present Nurse Communication: Mobility status PT Visit Diagnosis: Unsteadiness on feet (R26.81);Muscle weakness (generalized) (M62.81);Difficulty in walking, not elsewhere classified (R26.2)     Time: [id number] PT Time Calculation (min) (ACUTE ONLY): 31 min  Charges:  $Therapeutic Exercise: 8-22 mins $Therapeutic Activity: 8-22 mins                     Zenaida Niece, PT, DPT Acute Rehabilitation Pager: [phone]    Zenaida Niece 03/27/2019, 2:21 PM

## 2019-03-27 NOTE — TOC Benefit Eligibility Note (Signed)
Transition of Care [facility]) Benefit Eligibility Note    Patient Details  Name: Gregory Werner MRN: [id number] Date of Birth: 1924/09/15   Medication/Dose: Delene Loll 24-66m bid  Covered?: Yes  Tier: 2 Drug  Prescription Coverage Preferred Pharmacy: Any retail but the patient has used [facility] in the past  Spoke with Person/Company/Phone Number:: Brianna/ [employer]/ [phone] Co-Pay: 38.78 for a 30 day Supply Retail/ 76.08 fpr a 90 day supply mail Order  Prior Approval: No  Deductible: Met       IOrbie PyoPhone Number: 03/27/2019, 9:07 AM

## 2019-03-27 NOTE — Progress Notes (Addendum)
Progress Note  Patient Name: Gregory Werner Date of Encounter: 03/27/2019  Primary Cardiologist: Minus Breeding, MD   Subjective   No chest pain or dyspnea. Wants to eat.   Inpatient Medications    Scheduled Meds: . aspirin  81 mg Oral Daily  . atorvastatin  40 mg Oral q1800  . carvedilol  3.125 mg Oral BID WC  . sodium chloride flush  3 mL Intravenous Q12H  . sodium chloride flush  3 mL Intravenous Q12H  . sodium chloride flush  3 mL Intravenous Q12H  . Warfarin - Pharmacist Dosing Inpatient   Does not apply q1800   Continuous Infusions: . sodium chloride    . sodium chloride     PRN Meds: sodium chloride, sodium chloride, acetaminophen, docusate sodium, ondansetron (ZOFRAN) IV, senna, sodium chloride flush, sodium chloride flush   Vital Signs    Vitals:   03/26/19 1715 03/26/19 2018 03/27/19 0352 03/27/19 0400  BP: 140/69 118/68 130/81   Pulse: 90 75 79   Resp: 20 16 17    Temp: 97.8 F (36.6 C) 99.4 F (37.4 C) 98.3 F (36.8 C)   TempSrc:  Oral Oral   SpO2: 97% 95% 92%   Weight:    59.1 kg  Height:        Intake/Output Summary (Last 24 hours) at 03/27/2019 0755 Last data filed at 03/27/2019 0435 Gross per 24 hour  Intake 670 ml  Output 1235 ml  Net -565 ml   Last 3 Weights 03/27/2019 03/26/2019 03/25/2019  Weight (lbs) 130 lb 4.8 oz 133 lb 4.8 oz 136 lb 11.2 oz  Weight (kg) 59.104 kg 60.464 kg 62.007 kg      Telemetry    Atrial fib at rate of 70s - Personally Reviewed  ECG    No new tracing   Physical Exam   GEN: Thin frail ill appearing elderly male in no acute distress.   Neck: No JVD Cardiac: IR  IR , no murmurs, rubs, or gallops. R radial cath site without hematoma  Respiratory: Clear to auscultation bilaterally. GI: Soft, nontender, non-distended  MS: No edema; No deformity. Neuro:  Nonfocal  Psych: Normal affect   Labs    High Sensitivity Troponin:   Recent Labs  Lab 03/21/19 0200 03/21/19 0351  TROPONINIHS 103* 100*        Chemistry Recent Labs  Lab 03/22/19 0410 03/23/19 0330 03/24/19 0425 03/25/19 0509 03/26/19 0433 03/26/19 1001 03/26/19 1008 03/27/19 0439  NA 138 139 142 146* 148* 149* 149* 152*  K 3.5 3.6 3.3* 3.4* 3.3* 4.0 3.8 4.1  CL 99 99 101 104 104  --   --  114*  CO2 27 27 29 31 30   --   --  26  GLUCOSE 101* 128* 129* 122* 128*  --   --  133*  BUN 35* 33* 37* 41* 45*  --   --  53*  CREATININE 1.08 1.30* 1.08 1.22 1.32*  --   --  1.30*  CALCIUM 8.5* 8.8* 8.9 8.9 9.2  --   --  8.9  PROT 5.2* 6.0* 5.6*  --   --   --   --   --   ALBUMIN 2.7* 2.9* 2.7*  --   --   --   --   --   AST 57* 42* 29  --   --   --   --   --   ALT 75* 65* 49*  --   --   --   --   --  ALKPHOS 111 112 101  --   --   --   --   --   BILITOT 1.2 1.2 1.3*  --   --   --   --   --   GFRNONAA 59* 47* 59* 51* 47*  --   --  47*  GFRAA >60 55* >60 59* 54*  --   --  55*  ANIONGAP 12 13 12 11 14   --   --  12     Hematology Recent Labs  Lab 03/25/19 0509 03/26/19 0433 03/26/19 1001 03/26/19 1008 03/27/19 0439  WBC 15.2* 16.9*  --   --  19.8*  RBC 4.67 5.08  --   --  4.85  HGB 14.6 16.0 15.6 15.3 15.3  HCT 45.7 49.4 46.0 45.0 47.6  MCV 97.9 97.2  --   --  98.1  MCH 31.3 31.5  --   --  31.5  MCHC 31.9 32.4  --   --  32.1  RDW 13.5 13.5  --   --  13.6  PLT 290 309  --   --  269    BNP Recent Labs  Lab 03/20/19 2226  BNP 3,183.6*     Radiology    CARDIAC CATHETERIZATION  Result Date: 03/26/2019  Mid RCA lesion is 85% stenosed.  Prox RCA lesion is 45% stenosed.  Prox RCA to Mid RCA lesion is 40% stenosed.  1st Mrg lesion is 100% stenosed.  Dist RCA lesion is 30% stenosed.  LV end diastolic pressure is low.  Lat 1st Diag-1 lesion is 99% stenosed.  Lat 1st Diag-2 lesion is 100% stenosed.  Significant coronary calcification and multivessel CAD with subtotal large first diagonal stenosis/occlusion with faint retrograde collateralization from the distal LAD and distal RCA; totally occluded circumflex  marginal vessel; very large dominant calcified RCA with 40 to 50% smooth proximal stenosis, 40% proximal to mid stenosis, and evidence for 85% stenosis with severe eccentricity secondary to significant calcification proximal to the acute margin followed by 30% distal stenosis before large PDA and PLA vessel. Very mild increase in pulmonary artery systolic pressure. Previous echo documentation of EF 30 to 35%. RECOMMENDATION: We will gently hydrate post cath procedure with his creatinine clearance around 30.  Consider initiation of low-dose carvedilol.  In light of age of 85 years consider initial medical therapy trial but and if increasing symptomatology intervention to the RCA would be difficult due to vessel tortuosity and significant calcification and would require atherectomy.    Cardiac Studies    RIGHT/LEFT HEART CATH AND CORONARY ANGIOGRAPHY 03/27/2019  Conclusion    Mid RCA lesion is 85% stenosed.  Prox RCA lesion is 45% stenosed.  Prox RCA to Mid RCA lesion is 40% stenosed.  1st Mrg lesion is 100% stenosed.  Dist RCA lesion is 30% stenosed.  LV end diastolic pressure is low.  Lat 1st Diag-1 lesion is 99% stenosed.  Lat 1st Diag-2 lesion is 100% stenosed.   Significant coronary calcification and multivessel CAD with subtotal large first diagonal stenosis/occlusion with faint retrograde collateralization from the distal LAD and distal RCA; totally occluded circumflex marginal vessel; very large dominant calcified RCA with 40 to 50% smooth proximal stenosis, 40% proximal to mid stenosis, and evidence for 85% stenosis with severe eccentricity secondary to significant calcification proximal to the acute margin followed by 30% distal stenosis before large PDA and PLA vessel.  Very mild increase in pulmonary artery systolic pressure.  Previous echo documentation of EF 30 to 35%.  RECOMMENDATION: We will gently hydrate post cath procedure with his creatinine clearance around  30.  Consider initiation of low-dose carvedilol.  In light of age of 85 years consider initial medical therapy trial but and if increasing symptomatology intervention to the RCA would be difficult due to vessel tortuosity and significant calcification and would require atherectomy.   Diagnostic Dominance: Right  Intervention   Echo 03/21/2019 1. Left ventricular ejection fraction, by visual estimation, is 30 to 35%. The left ventricle has severely decreased function. Left ventricular septal wall thickness was severely increased. Mildly increased left ventricular posterior wall thickness.  There is mildly increased left ventricular hypertrophy.  2. Abnormal septal motion consistent with left bundle branch block.  3. Left ventricular diastolic function could not be evaluated.  4. The left ventricle demonstrates global hypokinesis.  5. Global right ventricle has moderately reduced systolic function.The right ventricular size is normal. No increase in right ventricular wall thickness.  6. Left atrial size was severely dilated.  7. Right atrial size was not assessed.  8. The mitral valve is abnormal. Trivial mitral valve regurgitation.  9. The tricuspid valve is grossly normal. Tricuspid valve regurgitation is mild. 10. The aortic valve is tricuspid. Aortic valve regurgitation is mild. Mild aortic valve sclerosis without stenosis. 11. The pulmonic valve was grossly normal. Pulmonic valve regurgitation is trivial. 12. Aortic dilatation noted. 13. There is mild dilatation of the ascending aorta and of the aortic root measuring 40 mm. 14. Moderately elevated pulmonary artery systolic pressure. 15. The tricuspid regurgitant velocity is 3.40 m/s, and with an assumed right atrial pressure of 15 mmHg, the estimated right ventricular systolic pressure is moderately elevated at 61.2 mmHg. 16. The inferior vena cava is dilated in size with <50% respiratory variability, suggesting right atrial pressure of  15 mmHg.  Patient Profile     85 y.o. male with a hx of permanent Afib on coumadin, HTN, HLD, prostate cancerseen for new onset CHF.   Assessment & Plan    1. Acute combined CHF - BNP 3183. Echo showed LVEF of 30-35% (was 60-65% in 2016). BNP 3183. Net I & O -3.4L. weight down 14lb (144>>130lb). Held lasix yesterday for LVEDP of 4 and RA of 6 by cath. Continue Coreg. Consider adding ACE/ARB/Entresto when normal renal function. Diuretics per MD. Euvolemic.   2. CAD - He underwent cath due to new LV dysfunction and LBBB. Cath result as above showing multivessel CAD. Plan for medical management. Could consider RCA arterectomy if refractory symptoms. Continue ASA, statin and BB.   3. Persistent afib - HR in 70-80s. Not on any rate control agent at baseline.  - Coumadin held for cath and given Vitamin K and bridge with heparin >> restarted coumadin back   4. Hematuria - stopped heparin post cath.  - Currently on ASA and coumadin   5. AKi - at baseline Scr runs normal.  -Scr stable today at 1.3  6. Deconditioning - Pending PT eval - consider Ensure  For questions or updates, please contact [place] Please consult [email or web address] for contact info under        Signed, Leanor Kail, PA  03/27/2019, 7:55 AM    History and all data above reviewed.  Patient examined.  I agree with the findings as above.  Very week but no distress.  The patient exam reveals [id number]  ,  Lungs: Clear  ,  Abd: Positive bowel sounds, no rebound no guarding, Ext No edema  .  All available labs, radiology  testing, previous records reviewed. Agree with documented assessment and plan.   OK to send home with help and PT at home.  We will arrange warfarin follow up and follow up TOC appt.  Discussed with primary team the UA and they will manage.  We will titrate meds very slowly as an out patient.  OK to send home on meds as on MAR.   Jeneen Rinks Kourtland Coopman  9:08 AM  03/27/2019

## 2019-03-27 NOTE — Progress Notes (Signed)
Patient's wife requested to speak to the cardiologist.  Paged MD- asked if he would call wife.  Gave him number.

## 2019-03-27 NOTE — TOC Transition Note (Addendum)
Transition of Care [facility]) - CM/SW Discharge Note   Patient Details  Name: Gregory Werner MRN: [id number] Date of Birth: 1924-08-06  [facility] of [facility]) CM/SW Contact:  Zenon Mayo, RN Phone Number: 03/27/2019, 3:18 PM   Clinical Narrative:    Patient is for dc tomorrow.  Wife at bedside wanted to speak with NCM while she had son and other children on the phone.  They would like to have a hospital bed, 3 n 1. Wife states he has a walker at home just like the one we have here in the hospital.  He will need ambulance transport home tomorrow. He is [facility] up with [facility], [place], [place], [facility] and Education officer, museum.  Wife states she also would like outpatient palliative services with [facility]. NCM notified MD of this information and referral made to [facility] with [place].  NCM left ambulance forms on the chart  For transport tomorrow, will need DNR form signed also.  Make sure DME (hospital bed)  has been delivered to home prior to dc.  3 n 1 , wilfe will need to take home with her.   Final next level of care: [place] Barriers to Discharge: No Barriers Identified   Patient Goals and CMS Choice Patient states their goals for this hospitalization and ongoing recovery are:: get better CMS Medicare.gov Compare Post Acute Care list provided to:: Patient Represenative (must comment)(wife) Choice offered to / list presented to : Spouse  Discharge Placement                       Discharge Plan and Services   Discharge Planning Services: CM Consult Post Acute Care Choice: Home Health, Resumption of Svcs/PTA Provider          DME Arranged: Hospital bed, 3-N-1 DME Agency: [employer] Date DME Agency Contacted: 03/27/19 Time DME Agency Contacted: [phone] Representative spoke with at DME Agency: zack HH Arranged: RN, PT, OT, Nurse's Aide, Social Work [employer] Agency: [place] Date [place]: 03/25/19 Time [place]:  [id number] Representative spoke with at [place]: [place] ([place]) Interventions     Readmission Risk Interventions No flowsheet data found.

## 2019-03-27 NOTE — Progress Notes (Signed)
During shift assessment, patient alert and oriented. Assisted patient with eating breakfast.   RN spoke with wife at bedside 1036. Wife expressed concerns about care of patient at home. Stated patient appeared weaker and inquired about RN care at home, possibly 24 hour care, wheelchair and assistance during the night.   Informed MD of wife's concerns via phone at 1140. Spoke with CM RN Neoma Laming via phone at 1145, informed of wife's concerns about resources and services needed at home and wife still present at bedside.CM RN will follow up  Received call from daughter in law [place] at 1159 inquired about compassionate visitation. Informed unlimited visitors with comfort care, however MD and palliative team will place orders and make determination. RN not aware of any such designation for this patient at this time.   1234 received call from [facility] about prescribed meds interacting with Warfarin. Provided [facility] with MD phone number for follow-up  CM RN confirmed patient to be discharged 12/24 with home health services.   Paged [place] at 1434.  Wife would like to speak with you in person or via phone about patient's status. BP 99/58, HR 74, 98% on RA  Paged MD [place] at 1612. Any parameters for BP and/or MAP before notifying you or rapid response? BP 99/58 and 107/59. Per [facility] via secure chat at 1630, ok if MAP is 65 and not symptomatic.   Patient declined VS at [place]. Observed some sputtering/coughing when attempting to drink soup from bowl during dinner. Offered assistance and to put in smaller cup, patient declined.   During 1900 med administration patient expressed frustration with taking pills and stated "tired of taking pills." And did "not want to take any more pills", however, took Eliquis. Offered bath, linen and gown change for the evening, patient declined.   Informed night RN of patient decline for assistance and patient care and informed of [place] MD to maintain MAP of  65 and not symptomatic.

## 2019-03-27 NOTE — Progress Notes (Signed)
    Durable Medical Equipment  (From admission, onward)         Start     Ordered   03/27/19 1243  For home use only DME Hospital bed  Once    Question Answer Comment  Length of Need Lifetime   Patient has (list medical condition): CHF, HTN, afib, SOB   The above medical condition requires: Patient requires the ability to reposition frequently   Head must be elevated greater than: 30 degrees   Bed type Semi-electric   Hoyer Lift Yes   Support Surface: Gel Overlay      03/27/19 1246   03/25/19 1524  For home use only DME 3 n 1  Once     03/25/19 1523

## 2019-03-27 NOTE — Progress Notes (Signed)
Unable to add appointment on patient discharge follow up. It states that "could not get lock on patient data"  INR check at [place] office on 12/29 @ 2pm Follow up with Dr. Percival Spanish 04/24/18 @11am 

## 2019-03-27 NOTE — Progress Notes (Signed)
[facility] for warfarin Indication: atrial fibrillation  Allergies  Allergen Reactions  . Cephalexin Diarrhea    Patient Measurements: Height: 5\' 10"  (177.8 cm) Weight: 130 lb 4.8 oz (59.1 kg)(scale a) IBW/kg (Calculated) : 73 Heparin Dosing Weight: 72.8 kg   Vital Signs: Temp: 98.2 F (36.8 C) (12/23 1419) Temp Source: Axillary (12/23 1419) BP: 108/77 (12/23 1834) Pulse Rate: 69 (12/23 1834)  Labs: Recent Labs    03/25/19 0509 03/26/19 0433 03/26/19 1001 03/26/19 1008 03/27/19 0439  HGB 14.6 16.0 15.6 15.3 15.3  HCT 45.7 49.4 46.0 45.0 47.6  PLT 290 309  --   --  269  LABPROT 29.2* 18.3*  --   --  17.6*  INR 2.8* 1.5*  --   --  1.5*  CREATININE 1.22 1.32*  --   --  1.30*    Estimated Creatinine Clearance: 30.3 mL/min (A) (by C-G formula based on SCr of 1.3 mg/dL (H)).   Medical History: Past Medical History:  Diagnosis Date  . Arthritis   . Atrial fibrillation ([place])   . Diverticulosis    history  . Dysphagia 2001   history s/p esophageal dilatation  . Dysrhythmia    hx. Atrial Fibrillation, RBBB  . GERD (gastroesophageal reflux disease)   . Hearing loss    bilateral hearing aids  . Hyperlipidemia   . Hypertension    benign  . Lung infiltrate 05/2010   left lower lobe infiltrate, questionable pneumonia  . Prostate cancer ([place]) 2001   treated with radiation    Medications:  Scheduled:  . aspirin  81 mg Oral Daily  . atorvastatin  40 mg Oral q1800  . carvedilol  3.125 mg Oral BID WC  . sodium chloride flush  3 mL Intravenous Q12H  . sodium chloride flush  3 mL Intravenous Q12H  . sodium chloride flush  3 mL Intravenous Q12H  . Warfarin - Pharmacist Dosing Inpatient   Does not apply q1800    Assessment: 49 yom presenting with acute heart failure exacerbation. On warfarin PTA for hx Afib - PTA regimen 5 mg daily except 2.5 mg MWF (LD 12/16 at 0700). INR on admit 3.8.   Plan for heparin infusion once INR<2.  INR today is 1.5 after receiving 2.5 mg vitamin K PO - no warfarin given this admission. CBC stable. No s/sx of bleeding. Plan for cardiac cath on 12/22@0730 .   Pt initially to be discharged today 12/23, but will now be staying overnight. Ordered warfarin 5mg  x1.  Goal of Therapy:  INR 2-3 Monitor platelets by anticoagulation protocol: Yes   Plan:  -Warfarin 5mg  x1 tonight   Arrie Senate, PharmD, BCPS Clinical Pharmacist [phone] Please check AMION for all [facility] numbers 03/27/2019

## 2019-03-27 NOTE — Progress Notes (Signed)
SLP Cancellation Note  Patient Details Name: Gregory Werner MRN: [id number] DOB: 1925-01-28   Cancelled treatment:       Reason Eval/Treat Not Completed: Fatigue/lethargy limiting ability to participate; pt with nursing staff.  Will attempt to evaluate later today if schedule permits.  Tanisha Lutes L. Tivis Ringer, [place] Office number [phone] Pager [phone]    Juan Quam Laurice 03/27/2019, 10:58 AM

## 2019-03-27 NOTE — Progress Notes (Signed)
[facility]  Referral for palliative services in the home. Spoke with son, confirmed interest.  ACC will f/u in the community once home.  Venia Carbon RN, BSN, [facility] Liaison (in [place]) [phone]

## 2019-03-28 LAB — PROTIME-INR
INR: 2.1 — ABNORMAL HIGH (ref 0.8–1.2)
Prothrombin Time: 23 seconds — ABNORMAL HIGH (ref 11.4–15.2)

## 2019-03-28 MED ORDER — WARFARIN SODIUM 2.5 MG PO TABS
2.5000 mg | ORAL_TABLET | ORAL | Status: DC
  Filled 2019-03-28: qty 1

## 2019-03-28 NOTE — Progress Notes (Signed)
Patient has been successfully discharged with [facility]. Family has been informed to pick up the bedside commode as [facility] is unable to transport it.

## 2019-03-28 NOTE — Social Work (Signed)
CSW called and left HIPAA compliant message for pt wife Maryalecia at [phone]. Call back from pt son at [phone], he is with pt wife, they are still waiting on hospital bed delivery. They believe it will be soon and know to give CSW a call when it arrives. Will arrange [facility]AR home at that time.   Westley Hummer, MSW, [employer] [facility]

## 2019-03-28 NOTE — Progress Notes (Addendum)
Physician Discharge Summary  Gregory Werner [id number] DOB: 1925-02-03 DOA: 03/20/2019  PCP: [name], DO  Admit date: 03/20/2019 Discharge date: 03/28/2019  Admitted From: Home Disposition: Home with home health  Recommendations for Outpatient Follow-up:  1. Follow up with PCP in 1-2 weeks 2. Please obtain BMP/CBC in one week  Home Health: Yes Equipment/Devices: Bed  Discharge Condition: Stable CODE STATUS: DNR Diet recommendation: Cardiac prudent low-salt diet  Brief/Interim Summary: Gregory Werner is a 85 y.o.malewith medical history significant ofA. fib, hypertension, hyperlipidemia, prostate cancer treated with radiation being sent to the ED by his PCP for evaluation ofanabnormal EKG. Patient states he saw his PCP yesterday and was advised to come into the ED as his EKG appeared abnormal. He has been experiencing shortness of breath, orthopnea, and a cough for the past few days. Denies chest pain. Work up in the ED was significant for BNP 3183, Chest x-ray showing cardiomegaly with vascular congestion and possible mild interstitial edema. Patient received IV Lasix 20 mg.  He was admitted for new onset CHF as well as new left bundle branch block on EKG.  Cardiology was consulted.   Patient admitted as above with notably abnormal EKG at PCP office with symptoms of shortness of breath orthopnea and cough found to have newly diagnosed heart failure.  Cardiology evaluating here, patient diuresed appropriately on diuretics, continue carvedilol, Lasix, currently holding off on ARB/ARNI given borderline elevated creatinine, certainly reasonable to start in the outpatient setting in the next 1 to 2 weeks pending labs. Will resume patient's warfarin at previous dosing, will need to follow-up with cardiology for ongoing Coumadin dosing -discussed possible use of home INR machine to help alleviate patient's difficulty with leaving the house.  PT OT evaluated recommending to continue  home health and reinitiate physical therapy at home.  Patient otherwise stable and agreeable for discharge home.  Incidentally noted repeat UA overnight does show what appears to be asymptomatic bacteriuria.  Will discharge patient with prescription for cephalexin however we discussed that unless the patient develops symptoms would hold off on taking the medication as he remains symptom-free from my discussion today.  *No acute issues or events overnight, patient and family looking forward to disposition later today  **Further discussion with family, Case management - family now hoping for hospice at home - have spoken with [facility] and will have them follow along at home - for "hospice at home".  Discharge Diagnoses:  Principal Problem:   CHF (congestive heart failure) (HCC) Active Problems:   HYPERTENSION, BENIGN   Atrial fibrillation (HCC)   LBBB (left bundle branch block)   Elevated LFTs   Acute systolic HF (heart failure) (HCC)   Shortness of breath    Discharge Instructions  Discharge Instructions    Call MD for:  difficulty breathing, headache or visual disturbances   Complete by: As directed    Call MD for:  extreme fatigue   Complete by: As directed    Call MD for:  hives   Complete by: As directed    Call MD for:  persistant dizziness or light-headedness   Complete by: As directed    Call MD for:  persistant nausea and vomiting   Complete by: As directed    Call MD for:  severe uncontrolled pain   Complete by: As directed    Call MD for:  temperature >100.4   Complete by: As directed    Diet - low sodium heart healthy   Complete by: As directed  Increase activity slowly   Complete by: As directed      Allergies as of 03/28/2019      Reactions   Cephalexin Diarrhea      Medication List    STOP taking these medications   ramipril 5 MG capsule Commonly known as: ALTACE     TAKE these medications   acetaminophen 500 MG tablet Commonly known as:  TYLENOL Take 1,000 mg by mouth every 6 (six) hours as needed for mild pain (Pain).   aspirin 81 MG chewable tablet Chew 1 tablet (81 mg total) by mouth daily.   atorvastatin 40 MG tablet Commonly known as: LIPITOR Take 1 tablet (40 mg total) by mouth daily at 6 PM.   BILBERRY EXTRACT PO Take by mouth daily.   calcium carbonate 200 MG capsule Take 800 mg by mouth daily.   carvedilol 3.125 MG tablet Commonly known as: COREG Take 1 tablet (3.125 mg total) by mouth 2 (two) times daily with a meal.   cephALEXin 500 MG capsule Commonly known as: KEFLEX Take 1 capsule (500 mg total) by mouth 2 (two) times daily for 3 days.   cholecalciferol 1000 units tablet Commonly known as: VITAMIN D Take 1,000 Units by mouth daily.   famotidine 10 MG chewable tablet Commonly known as: PEPCID AC Chew 10 mg by mouth at bedtime.   Fish Oil 1200 MG Caps Take 1 capsule by mouth daily.   glucosamine-chondroitin 500-400 MG tablet Take 1 tablet by mouth 3 (three) times daily.   Myrbetriq 50 MG Tb24 tablet Generic drug: mirabegron ER Take 1 tablet by mouth daily. Takes at PM   PreserVision AREDS Tabs Take 2 tablets by mouth daily.   terazosin 2 MG capsule Commonly known as: HYTRIN Take 2 mg by mouth at bedtime.   VITAMIN B 12 PO Take 1,000 mg by mouth daily.   vitamin C 500 MG tablet Commonly known as: ASCORBIC ACID Take 500 mg by mouth daily.   warfarin 5 MG tablet Commonly known as: COUMADIN Take as directed. If you are unsure how to take this medication, talk to your nurse or doctor. Original instructions: TAKE 1/2 TO 1 TABLET BY MOUTH DAILY AS DIRECTED BY COUMADIN CLINIC What changed:   how much to take  how to take this  when to take this  additional instructions            Durable Medical Equipment  (From admission, onward)         Start     Ordered   03/27/19 1243  For home use only DME Hospital bed  Once    Question Answer Comment  Length of Need Lifetime    Patient has (list medical condition): CHF, HTN, afib, SOB   The above medical condition requires: Patient requires the ability to reposition frequently   Head must be elevated greater than: 30 degrees   Bed type Semi-electric   Hoyer Lift Yes   Support Surface: Gel Overlay      03/27/19 1246   03/25/19 1524  For home use only DME 3 n 1  Once     03/25/19 1523         Follow-up Information    [facility] Follow up.   Why: 3 n 1, hospital bed       Home, Medi Follow up.   Why: HHPT, HHOT, [place], [place], Social Worker Contact information: [place] [address] [address]  Palliative Follow up.   Why: outpatient palliative services Contact information: [place] [address] [phone]         Allergies  Allergen Reactions  . Cephalexin Diarrhea    Consultations:  Cardiology, [facility], Dr Percival Spanish following   Procedures/Studies: DG Chest 2 View  Result Date: 03/20/2019 CLINICAL DATA:  Weakness over the last 2 days. EXAM: CHEST - 2 VIEW COMPARISON:  08/09/2013 FINDINGS: Cardiomegaly. Aortic atherosclerosis. Abnormal interstitial lung markings, most consistent with fluid overload/congestive heart failure. Cannot rule out the possibility of viral pneumonia. No dense consolidation or lobar collapse. Minimal blunting of the costophrenic angles but no large effusion. IMPRESSION: Cardiomegaly. Probable mild pulmonary edema/congestive heart failure. Cannot rule out the possibility of viral pneumonia. Electronically Signed   By: Nelson Chimes M.D.   On: 03/20/2019 13:54   CARDIAC CATHETERIZATION  Result Date: 03/26/2019  Mid RCA lesion is 85% stenosed.  Prox RCA lesion is 45% stenosed.  Prox RCA to Mid RCA lesion is 40% stenosed.  1st Mrg lesion is 100% stenosed.  Dist RCA lesion is 30% stenosed.  LV end diastolic pressure is low.  Lat 1st Diag-1 lesion is 99% stenosed.  Lat 1st Diag-2 lesion is 100%  stenosed.  Significant coronary calcification and multivessel CAD with subtotal large first diagonal stenosis/occlusion with faint retrograde collateralization from the distal LAD and distal RCA; totally occluded circumflex marginal vessel; very large dominant calcified RCA with 40 to 50% smooth proximal stenosis, 40% proximal to mid stenosis, and evidence for 85% stenosis with severe eccentricity secondary to significant calcification proximal to the acute margin followed by 30% distal stenosis before large PDA and PLA vessel. Very mild increase in pulmonary artery systolic pressure. Previous echo documentation of EF 30 to 35%. RECOMMENDATION: We will gently hydrate post cath procedure with his creatinine clearance around 30.  Consider initiation of low-dose carvedilol.  In light of age of 89 years consider initial medical therapy trial but and if increasing symptomatology intervention to the RCA would be difficult due to vessel tortuosity and significant calcification and would require atherectomy.   DG Chest Port 1 View  Result Date: 03/20/2019 CLINICAL DATA:  Shortness of breath EXAM: PORTABLE CHEST 1 VIEW COMPARISON:  03/20/2019 FINDINGS: Cardiomegaly, vascular congestion. Mild interstitial prominence could reflect interstitial edema. No effusions or acute bony abnormality. IMPRESSION: Cardiomegaly with vascular congestion and possible mild interstitial edema. Findings similar to prior study. Electronically Signed   By: Rolm Baptise M.D.   On: 03/20/2019 21:32   ECHOCARDIOGRAM COMPLETE  Result Date: 03/21/2019   ECHOCARDIOGRAM REPORT   Patient Name:   ROBBI GONZALO Chojnowski Date of Exam: 03/21/2019 Medical Rec #:  [id number]     Height:       70.0 in Accession #:    [id number]    Weight:       160.5 lb Date of Birth:  [birth year]    BSA:          1.90 m Patient Age:    23 years      BP:           145/62 mmHg Patient Gender: M             HR:           61 bpm. Exam Location:  Inpatient Procedure: 2D Echo  Indications:    CHF-Acute Diastolic [id number] / [id number]  History:        Patient has prior history of Echocardiogram examinations, most  recent 03/13/2015. Arrythmias:Atrial Fibrillation; Risk                 Factors:Hypertension and Dyslipidemia. History of prostate                 cancer, GERD.  Sonographer:    Darlina Sicilian RDCS Referring Phys: [id number] [place]  1. Left ventricular ejection fraction, by visual estimation, is 30 to 35%. The left ventricle has severely decreased function. Left ventricular septal wall thickness was severely increased. Mildly increased left ventricular posterior wall thickness. There is mildly increased left ventricular hypertrophy.  2. Abnormal septal motion consistent with left bundle branch block.  3. Left ventricular diastolic function could not be evaluated.  4. The left ventricle demonstrates global hypokinesis.  5. Global right ventricle has moderately reduced systolic function.The right ventricular size is normal. No increase in right ventricular wall thickness.  6. Left atrial size was severely dilated.  7. Right atrial size was not assessed.  8. The mitral valve is abnormal. Trivial mitral valve regurgitation.  9. The tricuspid valve is grossly normal. Tricuspid valve regurgitation is mild. 10. The aortic valve is tricuspid. Aortic valve regurgitation is mild. Mild aortic valve sclerosis without stenosis. 11. The pulmonic valve was grossly normal. Pulmonic valve regurgitation is trivial. 12. Aortic dilatation noted. 13. There is mild dilatation of the ascending aorta and of the aortic root measuring 40 mm. 14. Moderately elevated pulmonary artery systolic pressure. 15. The tricuspid regurgitant velocity is 3.40 m/s, and with an assumed right atrial pressure of 15 mmHg, the estimated right ventricular systolic pressure is moderately elevated at 61.2 mmHg. 16. The inferior vena cava is dilated in size with <50% respiratory variability,  suggesting right atrial pressure of 15 mmHg. FINDINGS  Left Ventricle: Left ventricular ejection fraction, by visual estimation, is 30 to 35%. The left ventricle has severely decreased function. The left ventricle demonstrates global hypokinesis. Mildly increased left ventricular posterior wall thickness. There is mildly increased left ventricular hypertrophy. Abnormal (paradoxical) septal motion, consistent with left bundle branch block. The left ventricular diastology could not be evaluated due to atrial fibrillation. Left ventricular diastolic function  could not be evaluated. Right Ventricle: The right ventricular size is normal. No increase in right ventricular wall thickness. Global RV systolic function is has moderately reduced systolic function. The tricuspid regurgitant velocity is 3.40 m/s, and with an assumed right atrial pressure of 15 mmHg, the estimated right ventricular systolic pressure is moderately elevated at 61.2 mmHg. Left Atrium: Left atrial size was severely dilated. Right Atrium: Right atrial size was not assessed Pericardium: There is no evidence of pericardial effusion. Mitral Valve: The mitral valve is abnormal. There is mild thickening of the mitral valve leaflet(s). Trivial mitral valve regurgitation. Tricuspid Valve: The tricuspid valve is grossly normal. Tricuspid valve regurgitation is mild. Aortic Valve: The aortic valve is tricuspid. Aortic valve regurgitation is mild. Mild aortic valve sclerosis is present, with no evidence of aortic valve stenosis. Pulmonic Valve: The pulmonic valve was grossly normal. Pulmonic valve regurgitation is trivial. Pulmonic regurgitation is trivial. Aorta: Aortic dilatation noted. There is mild dilatation of the ascending aorta and of the aortic root measuring 40 mm. Venous: The inferior vena cava is dilated in size with less than 50% respiratory variability, suggesting right atrial pressure of 15 mmHg. IAS/Shunts: No atrial level shunt detected by  color flow Doppler.  LEFT VENTRICLE PLAX 2D LVIDd:         4.30 cm LVIDs:  3.80 cm LV PW:         1.20 cm LV IVS:        2.00 cm LVOT diam:     1.70 cm LV SV:         21 ml LV SV Index:   11.12 LVOT Area:     2.27 cm  LV Volumes (MOD) LV area d, A2C:    25.70 cm LV area d, A4C:    34.90 cm LV area s, A2C:    23.80 cm LV area s, A4C:    27.50 cm LV major d, A2C:   7.37 cm LV major d, A4C:   8.09 cm LV major s, A2C:   7.18 cm LV major s, A4C:   7.86 cm LV vol d, MOD A2C: 77.0 ml LV vol d, MOD A4C: 124.0 ml LV vol s, MOD A2C: 66.4 ml LV vol s, MOD A4C: 80.8 ml LV SV MOD A2C:     10.6 ml LV SV MOD A4C:     124.0 ml LV SV MOD BP:      25.3 ml RIGHT VENTRICLE RV S prime:     6.81 cm/s TAPSE (M-mode): 1.6 cm LEFT ATRIUM              Index LA diam:        4.20 cm  2.21 cm/m LA Vol (A2C):   104.6 ml 55.00 ml/m LA Vol (A4C):   121.0 ml 63.65 ml/m LA Biplane Vol: 112.0 ml 58.92 ml/m  AORTIC VALVE LVOT Vmax:   113.00 cm/s LVOT Vmean:  66.400 cm/s LVOT VTI:    0.154 m  AORTA Ao Root diam: 4.20 cm Ao Asc diam:  3.95 cm MITRAL VALVE                       TRICUSPID VALVE MV Area (PHT): 6.71 cm            TR Peak grad:   46.2 mmHg MV PHT:        32.77 msec          TR Vmax:        359.00 cm/s MV Decel Time: 113 msec MV E velocity: 73.25 cm/s 103 cm/s SHUNTS                                    Systemic VTI:  0.15 m                                    Systemic Diam: 1.70 cm  Lyman Bishop MD Electronically signed by Lyman Bishop MD Signature Date/Time: 03/21/2019/10:02:12 AM    Final    US Abdomen Limited RUQ  Result Date: 03/21/2019 CLINICAL DATA:  Elevated liver function tests EXAM: ULTRASOUND ABDOMEN LIMITED RIGHT UPPER QUADRANT COMPARISON:  None. FINDINGS: Gallbladder: No gallstones or wall thickening visualized. No sonographic Murphy sign noted by sonographer. Common bile duct: Diameter: 4 mm Liver: 16 mm left hepatic cyst. 24 mm right hepatic cyst. No evidence of solid mass. Within normal limits in  parenchymal echogenicity. Portal vein is patent on color Doppler imaging with normal direction of blood flow towards the liver. IMPRESSION: 1. No explanation for the history.  No acute finding. 2. Small, incidental hepatic cysts. Electronically Signed   By: Neva Seat.D.  On: 03/21/2019 04:15    Subjective: No acute issues or events overnight, somewhat weak but otherwise feels improved from admission.  Denies chest pain, shortness of breath, nausea, vomiting, diarrhea, constipation, headache, fevers, chills.   Discharge Exam: Vitals:   03/27/19 1938 03/28/19 0432  BP: 114/78 108/66  Pulse: 73 81  Resp: 18 18  Temp: (!) 97.2 F (36.2 C) 97.8 F (36.6 C)  SpO2: 94% 97%   Vitals:   03/27/19 1545 03/27/19 1834 03/27/19 1938 03/28/19 0432  BP: (!) 107/59 108/77 114/78 108/66  Pulse: 61 69 73 81  Resp:  20 18 18   Temp:   (!) 97.2 F (36.2 C) 97.8 F (36.6 C)  TempSrc:   Oral   SpO2:  97% 94% 97%  Weight:    60.7 kg  Height:        General:  Pleasantly resting in bed, No acute distress. HEENT:  Normocephalic atraumatic.  Sclerae nonicteric, noninjected.  Extraocular movements intact bilaterally. Neck:  Without mass or deformity.  Trachea is midline. Lungs:  Clear to auscultate bilaterally without rhonchi, wheeze, or rales. Heart:  Regular rate and rhythm.  Without murmurs, rubs, or gallops. Abdomen:  Soft, nontender, nondistended.  Without guarding or rebound. Extremities: Without cyanosis, clubbing, edema, or obvious deformity. Vascular:  Dorsalis pedis and posterior tibial pulses palpable bilaterally. Skin:  Warm and dry, no erythema, no ulcerations.   The results of significant diagnostics from this hospitalization (including imaging, microbiology, ancillary and laboratory) are listed below for reference.     Microbiology: Recent Results (from the past 240 hour(s))  SARS CORONAVIRUS 2 (TAT 6-24 HRS) Nasopharyngeal Nasopharyngeal Swab     Status: None   Collection  Time: 03/21/19  4:34 AM   Specimen: Nasopharyngeal Swab  Result Value Ref Range Status   SARS Coronavirus 2 NEGATIVE NEGATIVE Final    Comment: (NOTE) SARS-CoV-2 target nucleic acids are NOT DETECTED. The SARS-CoV-2 RNA is generally detectable in upper and lower respiratory specimens during the acute phase of infection. Negative results do not preclude SARS-CoV-2 infection, do not rule out co-infections with other pathogens, and should not be used as the sole basis for treatment or other patient management decisions. Negative results must be combined with clinical observations, patient history, and epidemiological information. The expected result is Negative. Fact Sheet for Patients: SugarRoll.be Fact Sheet for Healthcare Providers: https://www.woods-mathews.com/ This test is not yet approved or cleared by the [place] FDA and  has been authorized for detection and/or diagnosis of SARS-CoV-2 by FDA under an Emergency Use Authorization (EUA). This EUA will remain  in effect (meaning this test can be used) for the duration of the COVID-19 declaration under Section 56 4(b)(1) of the Act, 21 U.S.C. section 360bbb-3(b)(1), unless the authorization is terminated or revoked sooner. Performed at [facility], [place] [address] [place], [place] [address]   MRSA PCR Screening     Status: None   Collection Time: 03/24/19 10:58 PM   Specimen: Nasopharyngeal  Result Value Ref Range Status   MRSA by PCR NEGATIVE NEGATIVE Final    Comment:        The GeneXpert MRSA Assay (FDA approved for NASAL specimens only), is one component of a comprehensive MRSA colonization surveillance program. It is not intended to diagnose MRSA infection nor to guide or monitor treatment for MRSA infections. Performed at [facility], [place] [address] [place], [place] [address]      Labs: BNP (last 3 results) Recent Labs  03/20/19 2226  BNP  [id number]*   Basic Metabolic Panel: Recent Labs  Lab 03/22/19 1118 03/23/19 0330 03/24/19 0425 03/25/19 0509 03/26/19 0433 03/26/19 1000 03/26/19 1001 03/26/19 1008 03/27/19 0439  NA  --  139 142 146* 148* 149* 149* 149* 152*  K  --  3.6 3.3* 3.4* 3.3* 3.8 4.0 3.8 4.1  CL  --  99 101 104 104  --   --   --  114*  CO2  --  27 29 31 30   --   --   --  26  GLUCOSE  --  128* 129* 122* 128*  --   --   --  133*  BUN  --  33* 37* 41* 45*  --   --   --  53*  CREATININE  --  1.30* 1.08 1.22 1.32*  --   --   --  1.30*  CALCIUM  --  8.8* 8.9 8.9 9.2  --   --   --  8.9  MG 1.9  --   --   --  2.2  --   --   --   --    Liver Function Tests: Recent Labs  Lab 03/22/19 0410 03/23/19 0330 03/24/19 0425  AST 57* 42* 29  ALT 75* 65* 49*  ALKPHOS 111 112 101  BILITOT 1.2 1.2 1.3*  PROT 5.2* 6.0* 5.6*  ALBUMIN 2.7* 2.9* 2.7*   No results for input(s): LIPASE, AMYLASE in the last 168 hours. No results for input(s): AMMONIA in the last 168 hours. CBC: Recent Labs  Lab 03/23/19 0330 03/24/19 0425 03/25/19 0509 03/26/19 0433 03/26/19 1000 03/26/19 1001 03/26/19 1008 03/27/19 0439  WBC 11.1* 13.4* 15.2* 16.9*  --   --   --  19.8*  NEUTROABS  --   --   --  14.7*  --   --   --   --   HGB 14.0 14.1 14.6 16.0 15.6 15.6 15.3 15.3  HCT 41.1 43.3 45.7 49.4 46.0 46.0 45.0 47.6  MCV 93.8 96.2 97.9 97.2  --   --   --  98.1  PLT 288 298 290 309  --   --   --  269   Cardiac Enzymes: No results for input(s): CKTOTAL, CKMB, CKMBINDEX, TROPONINI in the last 168 hours. BNP: Invalid input(s): POCBNP CBG: No results for input(s): GLUCAP in the last 168 hours. D-Dimer No results for input(s): DDIMER in the last 72 hours. Hgb A1c No results for input(s): HGBA1C in the last 72 hours. Lipid Profile No results for input(s): CHOL, HDL, LDLCALC, TRIG, CHOLHDL, LDLDIRECT in the last 72 hours. Thyroid function studies No results for input(s): TSH, T4TOTAL, T3FREE, THYROIDAB in the last 72  hours.  Invalid input(s): FREET3 Anemia work up No results for input(s): VITAMINB12, FOLATE, FERRITIN, TIBC, IRON, RETICCTPCT in the last 72 hours. Urinalysis    Component Value Date/Time   COLORURINE AMBER (A) 03/26/2019 2237   APPEARANCEUR CLOUDY (A) 03/26/2019 2237   LABSPEC 1.028 03/26/2019 2237   PHURINE 9.0 (H) 03/26/2019 2237   GLUCOSEU NEGATIVE 03/26/2019 2237   HGBUR MODERATE (A) 03/26/2019 2237   BILIRUBINUR NEGATIVE 03/26/2019 2237   KETONESUR NEGATIVE 03/26/2019 2237   PROTEINUR >=300 (A) 03/26/2019 2237   UROBILINOGEN 2.0 (H) 08/09/2013 1417   NITRITE NEGATIVE 03/26/2019 2237   LEUKOCYTESUR LARGE (A) 03/26/2019 2237   Sepsis Labs Invalid input(s): PROCALCITONIN,  WBC,  LACTICIDVEN Microbiology Recent Results (from the past 240 hour(s))  SARS CORONAVIRUS 2 (TAT  6-24 HRS) Nasopharyngeal Nasopharyngeal Swab     Status: None   Collection Time: 03/21/19  4:34 AM   Specimen: Nasopharyngeal Swab  Result Value Ref Range Status   SARS Coronavirus 2 NEGATIVE NEGATIVE Final    Comment: (NOTE) SARS-CoV-2 target nucleic acids are NOT DETECTED. The SARS-CoV-2 RNA is generally detectable in upper and lower respiratory specimens during the acute phase of infection. Negative results do not preclude SARS-CoV-2 infection, do not rule out co-infections with other pathogens, and should not be used as the sole basis for treatment or other patient management decisions. Negative results must be combined with clinical observations, patient history, and epidemiological information. The expected result is Negative. Fact Sheet for Patients: SugarRoll.be Fact Sheet for Healthcare Providers: https://www.woods-mathews.com/ This test is not yet approved or cleared by the [place] FDA and  has been authorized for detection and/or diagnosis of SARS-CoV-2 by FDA under an Emergency Use Authorization (EUA). This EUA will remain  in effect (meaning  this test can be used) for the duration of the COVID-19 declaration under Section 56 4(b)(1) of the Act, 21 U.S.C. section 360bbb-3(b)(1), unless the authorization is terminated or revoked sooner. Performed at [facility], [place] [address], [place], [place] [address]   MRSA PCR Screening     Status: None   Collection Time: 03/24/19 10:58 PM   Specimen: Nasopharyngeal  Result Value Ref Range Status   MRSA by PCR NEGATIVE NEGATIVE Final    Comment:        The GeneXpert MRSA Assay (FDA approved for NASAL specimens only), is one component of a comprehensive MRSA colonization surveillance program. It is not intended to diagnose MRSA infection nor to guide or monitor treatment for MRSA infections. Performed at [facility], [place] [address], [place], [place] [address]      Time coordinating discharge: Over 30 minutes  SIGNED:   Little Ishikawa, DO [facility] 03/28/2019, 6:23 AM Pager   If 7PM-7AM, please contact night-coverage [email or web address] Password [email or web address]

## 2019-03-28 NOTE — Progress Notes (Signed)
Offered to clean patient up and place new gown on patient, patient refused.  Patient states he is not in any pain.  When asked if there was anything that could be done for the patient his only request was lights off and close the door.

## 2019-03-28 NOTE — Progress Notes (Signed)
[facility] for warfarin Indication: atrial fibrillation  Allergies  Allergen Reactions  . Cephalexin Diarrhea    Patient Measurements: Height: 5\' 10"  (177.8 cm) Weight: 133 lb 13.1 oz (60.7 kg) IBW/kg (Calculated) : 73 Heparin Dosing Weight: 60.5 kg   Vital Signs: Temp: 98.6 F (37 C) (12/24 0828) Temp Source: Axillary (12/24 0828) BP: 114/69 (12/24 0828) Pulse Rate: 70 (12/24 0828)  Labs: Recent Labs    03/26/19 0433 03/26/19 1001 03/26/19 1008 03/27/19 0439 03/28/19 0429  HGB 16.0 15.6 15.3 15.3  --   HCT 49.4 46.0 45.0 47.6  --   PLT 309  --   --  269  --   LABPROT 18.3*  --   --  17.6* 23.0*  INR 1.5*  --   --  1.5* 2.1*  CREATININE 1.32*  --   --  1.30*  --     Estimated Creatinine Clearance: 31.1 mL/min (A) (by C-G formula based on SCr of 1.3 mg/dL (H)).   Medical History: Past Medical History:  Diagnosis Date  . Arthritis   . Atrial fibrillation ([place])   . Diverticulosis    history  . Dysphagia 2001   history s/p esophageal dilatation  . Dysrhythmia    hx. Atrial Fibrillation, RBBB  . GERD (gastroesophageal reflux disease)   . Hearing loss    bilateral hearing aids  . Hyperlipidemia   . Hypertension    benign  . Lung infiltrate 05/2010   left lower lobe infiltrate, questionable pneumonia  . Prostate cancer ([place]) 2001   treated with radiation    Medications:  Scheduled:  . aspirin  81 mg Oral Daily  . atorvastatin  40 mg Oral q1800  . carvedilol  3.125 mg Oral BID WC  . sodium chloride flush  3 mL Intravenous Q12H  . sodium chloride flush  3 mL Intravenous Q12H  . sodium chloride flush  3 mL Intravenous Q12H  . Warfarin - Pharmacist Dosing Inpatient   Does not apply q1800    Assessment: 39 yom presenting with acute heart failure exacerbation. On warfarin PTA for hx Afib - PTA regimen 5 mg daily except 2.5 mg MWF (LD 12/16 at 0700). INR on admit 3.8.   INR today is therapeutic at 2.1. no CBC today  but his H&H has been stable, yesterday at 15.3/47.6. plts are wnl. Renal function has slightly increased, today his Cr is 1.3.   Pt initially to be discharged 12/23 but was kept overnight. Discharge orders placed now for 12/24, but will provide warfarin dose in case he stays again.   Goal of Therapy:  INR 2-3 Monitor platelets by anticoagulation protocol: Yes   Plan:  -Warfarin 2.5 x 1 tonight  - if discharged, resume PTA dose with close INR follow up as outpatient with home health   Thank you,   Eddie Candle, PharmD PGY-1 Pharmacy Resident   Please check amion for clinical pharmacist contact number

## 2019-03-28 NOTE — Progress Notes (Signed)
During shift assessment patient able to state wife's name when asked. Able to follow commands and respond "no" and shake head indicating "no" when asked if he was in pain. Patient somewhat restless, RN offered comfort.   Patient declined mouth moisturizer when offered.  Informed [place] via face to face of patient's decline to take meds, restlessness and informed ok to hold Coreg given BP systolic less than [id number].   Wife arrived to bedside approx [phone].   During attempt to administer meds (crushed/dissolved Coumadin per prior pharmacy advisement) to assist with swallowing, patient had difficulty swallowing. Did not attempt to further administer meds.   Paged [place] 1143.Unable to administer NOW dose of Coumadin, having trouble swallowing. consumed less than 1/3 tablet.    Paged [place]  Please reconcile orders necessary to complete/print AVS. Thank you!  Notified by LCSW approx [phone] wife concerned about patient condition and discharge home. RN to patient's room and spoke with wife at bedside (patient in no distress, condition same as prior assessment). Wife anxious and expressed concerns about patient care upon returning home and inquired about delaying discharge 1-2 days. Informed wife will relay information to care team. Paged [place] at approx 1250 and informed of wife's concerns and delay discharge. Upon callback at approx [place] spoke directly with wife. Wife then confirmed ok with discharging patient home today.   Paged at [phone]. Please reconcile AVS. I can not print it until reconciled. Thank you!  Reviewed AVS with wife at bedside at 1415.

## 2019-03-28 NOTE — Progress Notes (Signed)
Will consult with social work to determine what to do with bedside commode. Has patient label on it and is sitting in the equipment room.

## 2019-03-28 NOTE — Social Work (Addendum)
3:48pm- CSW has spoken with pt family again. They have made multiple calls to providers. They now prefer for pt to go home with hospice services. They have spoken with [facility] and would like them to follow at home- requested MD to place "hospice services at home," on discharge summary. CSW confirmed they would like to cancel [facility] referral and [facility] services through [facility]. Referral cancelled with Harmon Pier ([place] liaison) and Judson Roch ([place]). [facility] still scheduled to pick pt up this afternoon.   1:14pm- CSW spoke with RN- pt RN and MD spoke with pt family, concerns have been addressed. [facility] rescheduled for 3pm.   12:29pm- CSW received a call from pt daughter expressing concern about pt discharge today. States pt wife is at bedside and has multiple concerns. Requested pt daughter call and alert RN at bedside about these concerns. CSW has placed PTAR on hold at this time.   10:41am- Pt daughter called and spoke with CSW. Family has received equipment they are ready for pt to return- they are coming to hospital to go over discharge instructions and are amenable to [facility] being arranged for 1pm. Pt pick up scheduled for 1pm. RN alerted that pt family coming for dc instructions.   Westley Hummer, MSW, [place] Work

## 2019-03-28 NOTE — Progress Notes (Signed)
Patient too weak to complete standing weight this morning.  Bed weight completed.

## 2019-04-05 DEATH — deceased

## 2019-04-25 ENCOUNTER — Ambulatory Visit: Payer: Medicare Other | Admitting: Cardiology

## 2019-06-07 ENCOUNTER — Ambulatory Visit: Payer: Medicare Other | Admitting: Podiatry

## 2020-11-14 IMAGING — DX DG CHEST 1V PORT
1 series · 1 of 1 positions shown · non-contrast
Comparison: 03/20/2019

CLINICAL DATA: Shortness of breath

EXAM:
PORTABLE CHEST 1 VIEW

[chest ap]
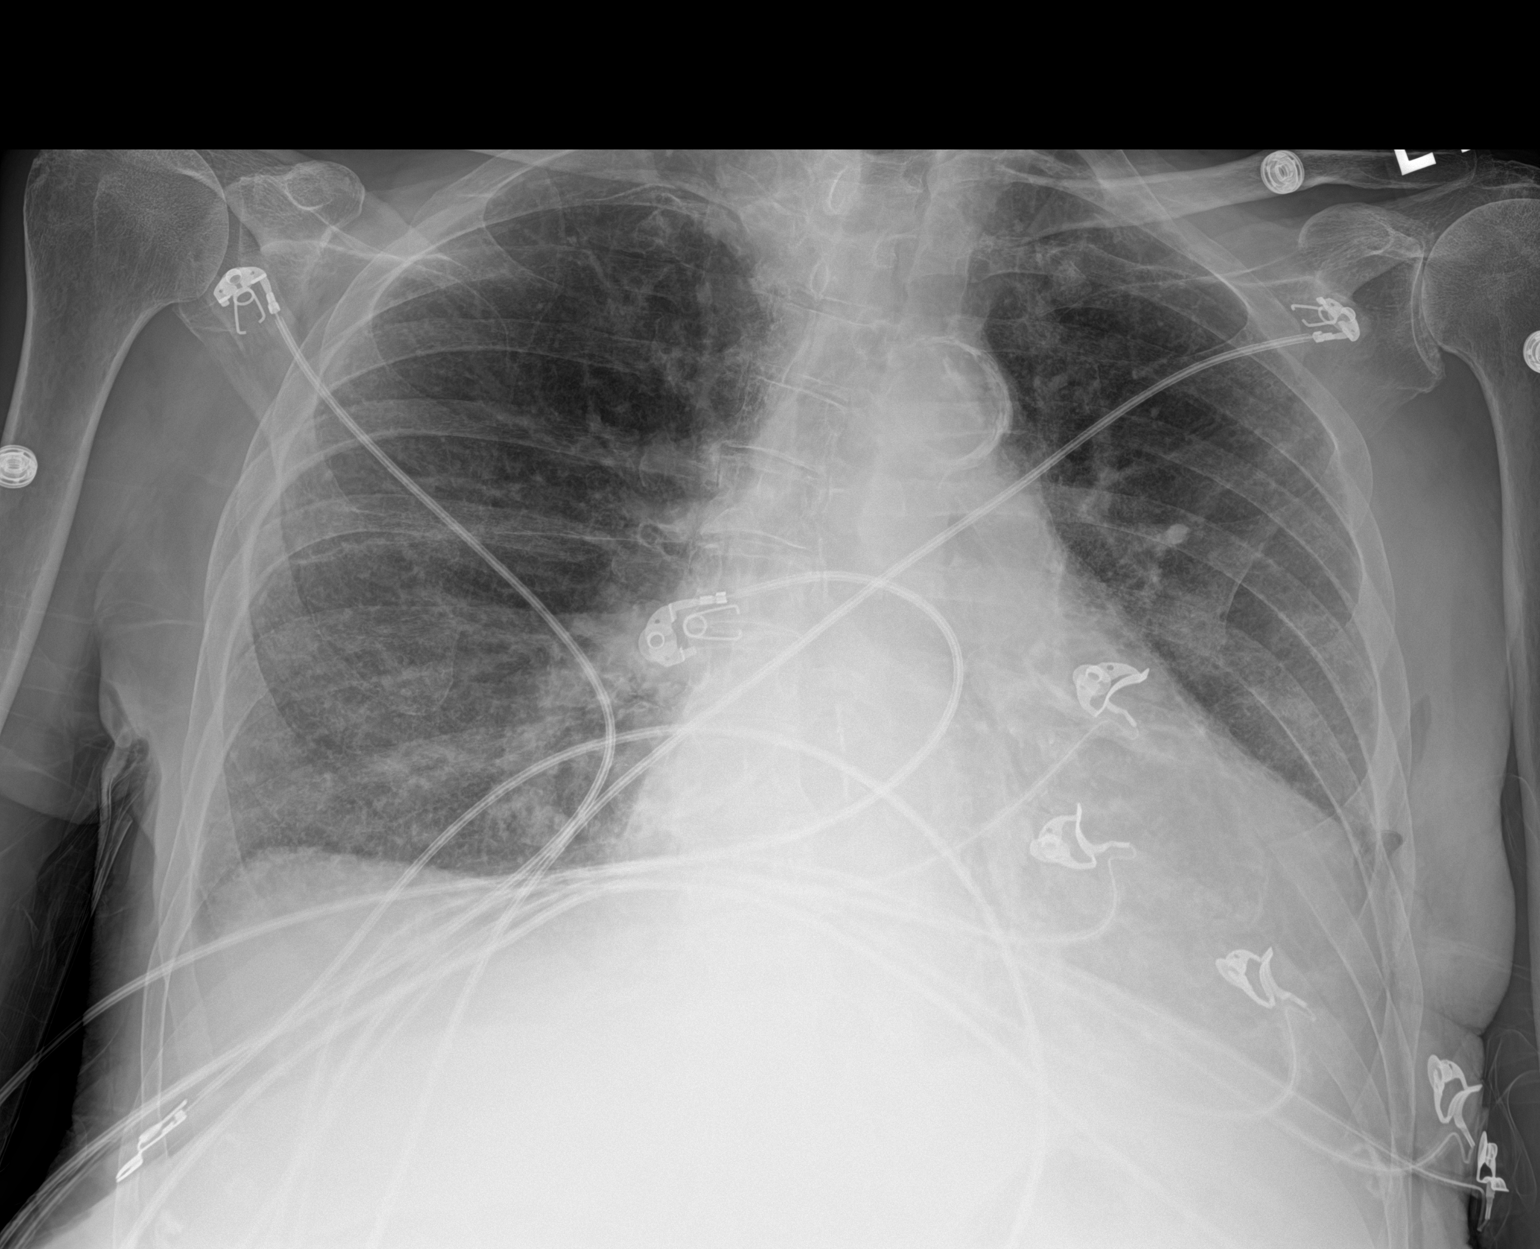

[1 of 1 positions shown; findings below may reference images not displayed]

FINDINGS: Cardiomegaly, vascular congestion. Mild interstitial prominence
could reflect interstitial edema. No effusions or acute bony
abnormality.
IMPRESSION: Cardiomegaly with vascular congestion and possible mild interstitial
edema. Findings similar to prior study.

## 2020-11-14 IMAGING — DX DG CHEST 2V
2 series · 2 of 2 positions shown · non-contrast
Comparison: 08/09/2013

CLINICAL DATA: Weakness over the last 2 days.

EXAM:
CHEST - 2 VIEW

[dg chest 2 view (1 of 2)]
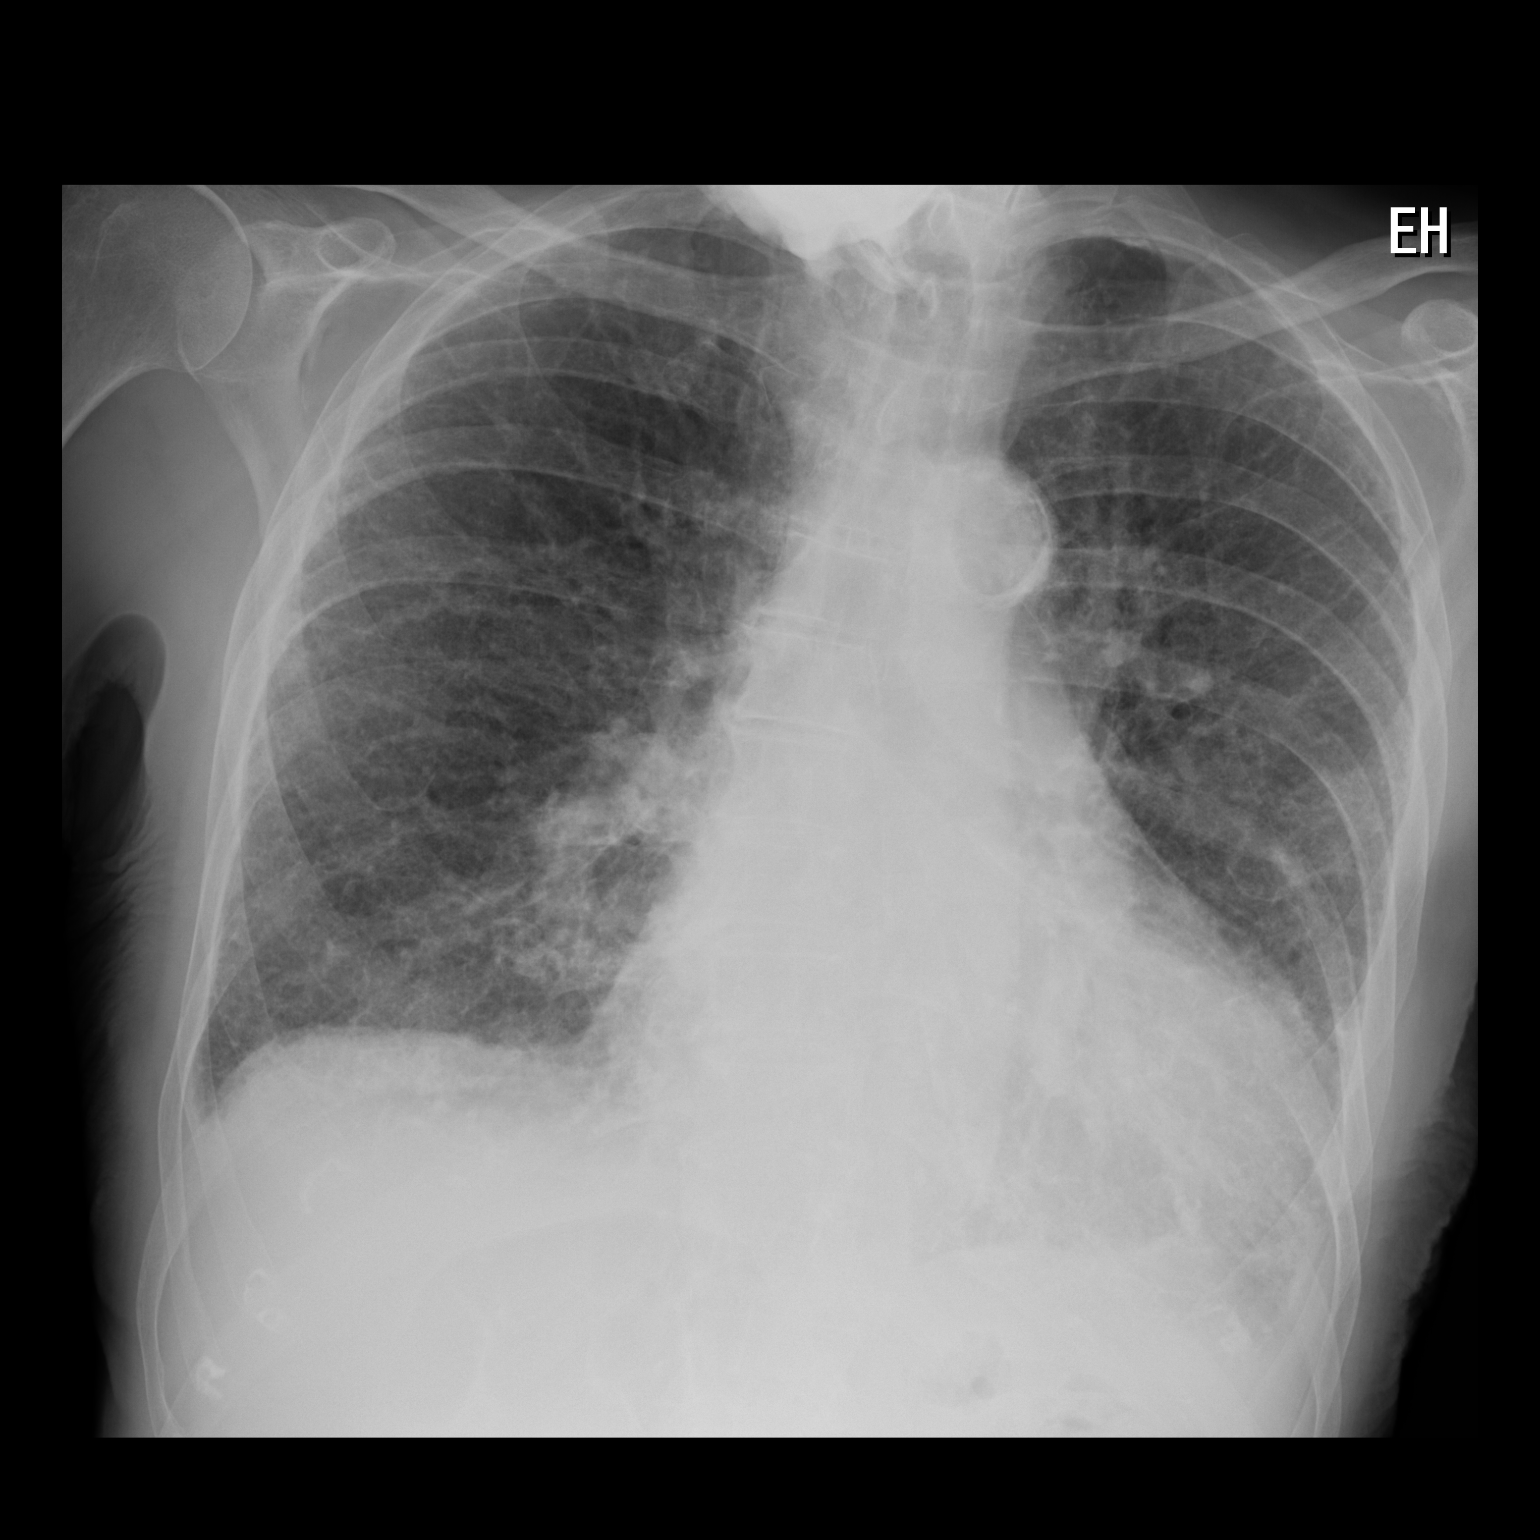

[dg chest 2 view (2 of 2)]
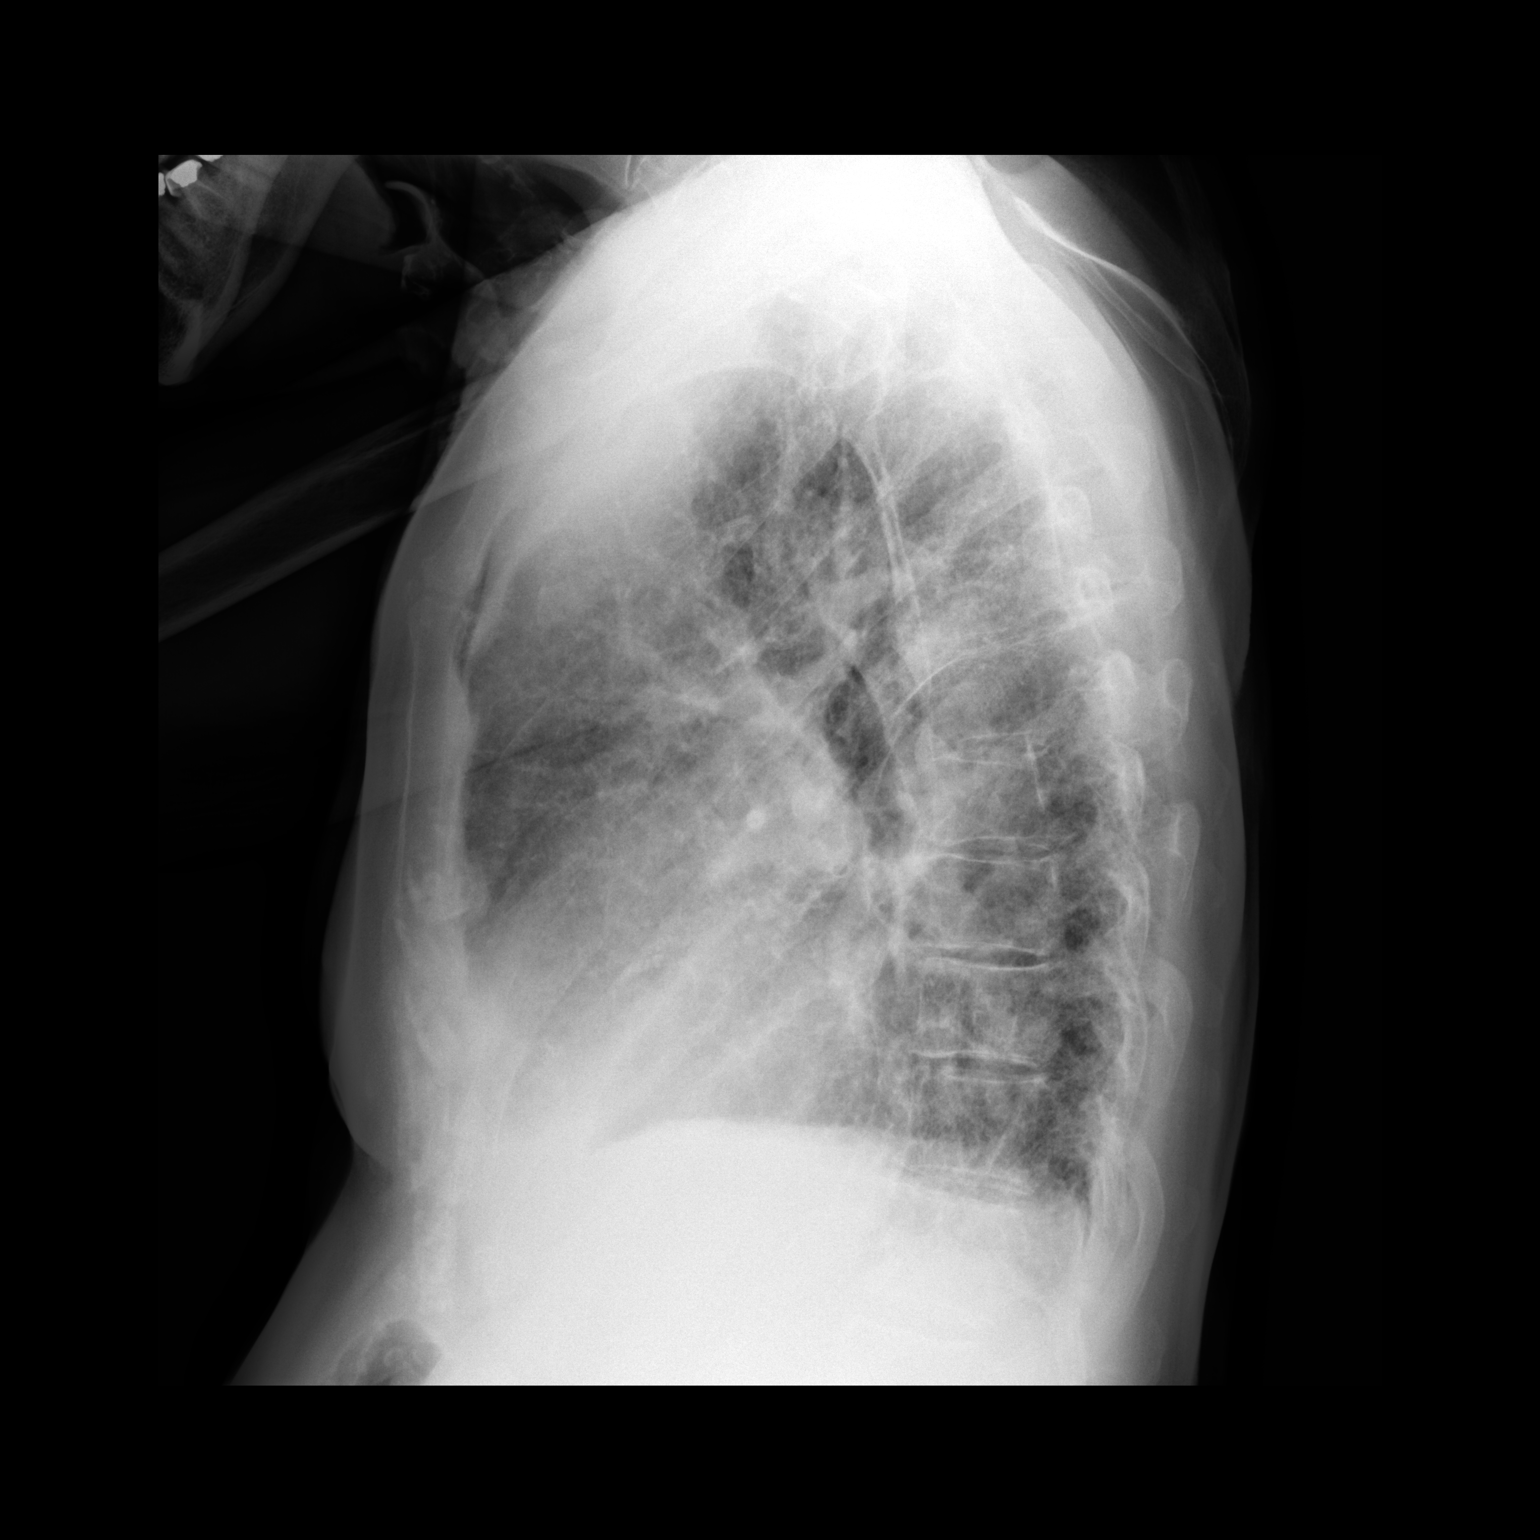

[2 of 2 positions shown; findings below may reference images not displayed]

FINDINGS: Cardiomegaly. Aortic atherosclerosis. Abnormal interstitial lung
markings, most consistent with fluid overload/congestive heart
failure. Cannot rule out the possibility of viral pneumonia. No
dense consolidation or lobar collapse. Minimal blunting of the
costophrenic angles but no large effusion.
IMPRESSION: Cardiomegaly. Probable mild pulmonary edema/congestive heart
failure. Cannot rule out the possibility of viral pneumonia.
